# Patient Record
Sex: Female | Born: 1970 | Race: White | Hispanic: No | Smoking: Former smoker
Health system: Southern US, Community
[De-identification: ages and names within clinical notes are randomized; demographics above are authoritative.]

## PROBLEM LIST (undated history)

## (undated) DIAGNOSIS — G4733 Obstructive sleep apnea (adult) (pediatric): Secondary | ICD-10-CM

## (undated) DIAGNOSIS — N281 Cyst of kidney, acquired: Secondary | ICD-10-CM

## (undated) DIAGNOSIS — K219 Gastro-esophageal reflux disease without esophagitis: Secondary | ICD-10-CM

## (undated) DIAGNOSIS — B019 Varicella without complication: Secondary | ICD-10-CM

## (undated) DIAGNOSIS — G4730 Sleep apnea, unspecified: Secondary | ICD-10-CM

## (undated) DIAGNOSIS — D649 Anemia, unspecified: Secondary | ICD-10-CM

## (undated) DIAGNOSIS — I351 Nonrheumatic aortic (valve) insufficiency: Secondary | ICD-10-CM

## (undated) DIAGNOSIS — R519 Headache, unspecified: Secondary | ICD-10-CM

## (undated) DIAGNOSIS — L661 Lichen planopilaris, unspecified: Secondary | ICD-10-CM

## (undated) DIAGNOSIS — E785 Hyperlipidemia, unspecified: Secondary | ICD-10-CM

## (undated) DIAGNOSIS — F419 Anxiety disorder, unspecified: Secondary | ICD-10-CM

## (undated) DIAGNOSIS — K449 Diaphragmatic hernia without obstruction or gangrene: Secondary | ICD-10-CM

## (undated) DIAGNOSIS — R29898 Other symptoms and signs involving the musculoskeletal system: Secondary | ICD-10-CM

## (undated) DIAGNOSIS — T7840XA Allergy, unspecified, initial encounter: Secondary | ICD-10-CM

## (undated) DIAGNOSIS — IMO0001 Reserved for inherently not codable concepts without codable children: Secondary | ICD-10-CM

## (undated) DIAGNOSIS — IMO0002 Reserved for concepts with insufficient information to code with codable children: Secondary | ICD-10-CM

## (undated) DIAGNOSIS — R51 Headache: Secondary | ICD-10-CM

## (undated) DIAGNOSIS — I10 Essential (primary) hypertension: Secondary | ICD-10-CM

## (undated) DIAGNOSIS — U071 COVID-19: Secondary | ICD-10-CM

## (undated) HISTORY — DX: Anemia, unspecified: D64.9

## (undated) HISTORY — DX: Cyst of kidney, acquired: N28.1

## (undated) HISTORY — DX: Gastro-esophageal reflux disease without esophagitis: K21.9

## (undated) HISTORY — DX: Lichen planopilaris, unspecified: L66.10

## (undated) HISTORY — DX: Essential (primary) hypertension: I10

## (undated) HISTORY — DX: Sleep apnea, unspecified: G47.30

## (undated) HISTORY — PX: TONSILLECTOMY AND ADENOIDECTOMY: SUR1326

## (undated) HISTORY — PX: COLPOSCOPY: SHX161

## (undated) HISTORY — DX: Varicella without complication: B01.9

## (undated) HISTORY — DX: Anxiety disorder, unspecified: F41.9

## (undated) HISTORY — DX: Headache, unspecified: R51.9

## (undated) HISTORY — DX: Headache: R51

## (undated) HISTORY — DX: Diaphragmatic hernia without obstruction or gangrene: K44.9

## (undated) HISTORY — DX: Reserved for concepts with insufficient information to code with codable children: IMO0002

## (undated) HISTORY — DX: Other symptoms and signs involving the musculoskeletal system: R29.898

## (undated) HISTORY — DX: Nonrheumatic aortic (valve) insufficiency: I35.1

## (undated) HISTORY — DX: Hyperlipidemia, unspecified: E78.5

## (undated) HISTORY — DX: Lichen planopilaris: L66.1

## (undated) HISTORY — DX: Allergy, unspecified, initial encounter: T78.40XA

## (undated) HISTORY — DX: Obstructive sleep apnea (adult) (pediatric): G47.33

## (undated) HISTORY — DX: Reserved for inherently not codable concepts without codable children: IMO0001

---

## 1999-04-09 ENCOUNTER — Emergency Department (HOSPITAL_COMMUNITY): Admission: EM | Admit: 1999-04-09 | Discharge: 1999-04-09 | Payer: Self-pay | Admitting: Emergency Medicine

## 2000-03-11 ENCOUNTER — Encounter: Payer: Self-pay | Admitting: Internal Medicine

## 2000-03-11 ENCOUNTER — Encounter: Admission: RE | Admit: 2000-03-11 | Discharge: 2000-03-11 | Payer: Self-pay | Admitting: Internal Medicine

## 2001-12-11 ENCOUNTER — Encounter: Payer: Self-pay | Admitting: Geriatric Medicine

## 2001-12-11 ENCOUNTER — Encounter: Admission: RE | Admit: 2001-12-11 | Discharge: 2001-12-11 | Payer: Self-pay | Admitting: Geriatric Medicine

## 2002-02-05 ENCOUNTER — Encounter: Payer: Self-pay | Admitting: Emergency Medicine

## 2002-02-05 ENCOUNTER — Emergency Department (HOSPITAL_COMMUNITY): Admission: EM | Admit: 2002-02-05 | Discharge: 2002-02-05 | Payer: Self-pay | Admitting: Emergency Medicine

## 2002-08-20 ENCOUNTER — Emergency Department (HOSPITAL_COMMUNITY): Admission: EM | Admit: 2002-08-20 | Discharge: 2002-08-21 | Payer: Self-pay | Admitting: Emergency Medicine

## 2002-08-20 ENCOUNTER — Encounter: Payer: Self-pay | Admitting: Emergency Medicine

## 2003-01-12 ENCOUNTER — Other Ambulatory Visit: Admission: RE | Admit: 2003-01-12 | Discharge: 2003-01-12 | Payer: Self-pay | Admitting: Gynecology

## 2003-03-10 ENCOUNTER — Emergency Department (HOSPITAL_COMMUNITY): Admission: EM | Admit: 2003-03-10 | Discharge: 2003-03-11 | Payer: Self-pay | Admitting: Emergency Medicine

## 2003-04-22 ENCOUNTER — Ambulatory Visit (HOSPITAL_COMMUNITY): Admission: RE | Admit: 2003-04-22 | Discharge: 2003-04-22 | Payer: Self-pay | Admitting: Gynecology

## 2003-07-27 ENCOUNTER — Inpatient Hospital Stay (HOSPITAL_COMMUNITY): Admission: AD | Admit: 2003-07-27 | Discharge: 2003-07-29 | Payer: Self-pay | Admitting: Gynecology

## 2003-07-31 ENCOUNTER — Encounter: Admission: RE | Admit: 2003-07-31 | Discharge: 2003-08-30 | Payer: Self-pay | Admitting: Gynecology

## 2003-09-08 ENCOUNTER — Other Ambulatory Visit: Admission: RE | Admit: 2003-09-08 | Discharge: 2003-09-08 | Payer: Self-pay | Admitting: Gynecology

## 2003-09-30 ENCOUNTER — Encounter: Admission: RE | Admit: 2003-09-30 | Discharge: 2003-10-30 | Payer: Self-pay | Admitting: Gynecology

## 2003-10-31 ENCOUNTER — Encounter: Admission: RE | Admit: 2003-10-31 | Discharge: 2003-11-30 | Payer: Self-pay | Admitting: Gynecology

## 2003-12-29 ENCOUNTER — Encounter: Admission: RE | Admit: 2003-12-29 | Discharge: 2004-01-28 | Payer: Self-pay | Admitting: Gynecology

## 2004-02-28 ENCOUNTER — Encounter: Admission: RE | Admit: 2004-02-28 | Discharge: 2004-03-29 | Payer: Self-pay | Admitting: Gynecology

## 2004-03-08 ENCOUNTER — Other Ambulatory Visit: Admission: RE | Admit: 2004-03-08 | Discharge: 2004-03-08 | Payer: Self-pay | Admitting: Gynecology

## 2004-04-29 ENCOUNTER — Encounter: Admission: RE | Admit: 2004-04-29 | Discharge: 2004-05-29 | Payer: Self-pay | Admitting: Gynecology

## 2004-05-28 ENCOUNTER — Emergency Department (HOSPITAL_COMMUNITY): Admission: EM | Admit: 2004-05-28 | Discharge: 2004-05-28 | Payer: Self-pay | Admitting: Emergency Medicine

## 2004-08-29 ENCOUNTER — Other Ambulatory Visit: Admission: RE | Admit: 2004-08-29 | Discharge: 2004-08-29 | Payer: Self-pay | Admitting: Gynecology

## 2005-01-15 ENCOUNTER — Encounter: Admission: RE | Admit: 2005-01-15 | Discharge: 2005-01-15 | Payer: Self-pay | Admitting: Gynecology

## 2005-02-12 ENCOUNTER — Inpatient Hospital Stay (HOSPITAL_COMMUNITY): Admission: AD | Admit: 2005-02-12 | Discharge: 2005-02-12 | Payer: Self-pay | Admitting: Gynecology

## 2005-03-14 ENCOUNTER — Inpatient Hospital Stay (HOSPITAL_COMMUNITY): Admission: AD | Admit: 2005-03-14 | Discharge: 2005-03-14 | Payer: Self-pay | Admitting: Gynecology

## 2005-03-19 ENCOUNTER — Inpatient Hospital Stay (HOSPITAL_COMMUNITY): Admission: AD | Admit: 2005-03-19 | Discharge: 2005-03-21 | Payer: Self-pay | Admitting: Gynecology

## 2005-03-22 ENCOUNTER — Emergency Department (HOSPITAL_COMMUNITY): Admission: EM | Admit: 2005-03-22 | Discharge: 2005-03-22 | Payer: Self-pay | Admitting: Emergency Medicine

## 2005-04-04 ENCOUNTER — Emergency Department (HOSPITAL_COMMUNITY): Admission: EM | Admit: 2005-04-04 | Discharge: 2005-04-05 | Payer: Self-pay | Admitting: Emergency Medicine

## 2005-05-09 ENCOUNTER — Other Ambulatory Visit: Admission: RE | Admit: 2005-05-09 | Discharge: 2005-05-09 | Payer: Self-pay | Admitting: Gynecology

## 2005-06-19 ENCOUNTER — Ambulatory Visit: Payer: Self-pay | Admitting: Internal Medicine

## 2005-06-20 ENCOUNTER — Ambulatory Visit: Payer: Self-pay | Admitting: Internal Medicine

## 2005-10-23 ENCOUNTER — Emergency Department (HOSPITAL_COMMUNITY): Admission: EM | Admit: 2005-10-23 | Discharge: 2005-10-23 | Payer: Self-pay | Admitting: Family Medicine

## 2005-10-24 ENCOUNTER — Ambulatory Visit: Payer: Self-pay | Admitting: Internal Medicine

## 2006-01-06 ENCOUNTER — Ambulatory Visit: Payer: Self-pay | Admitting: Internal Medicine

## 2006-01-08 ENCOUNTER — Emergency Department (HOSPITAL_COMMUNITY): Admission: EM | Admit: 2006-01-08 | Discharge: 2006-01-08 | Payer: Self-pay | Admitting: Emergency Medicine

## 2006-01-08 ENCOUNTER — Ambulatory Visit: Payer: Self-pay | Admitting: Cardiology

## 2006-01-14 ENCOUNTER — Ambulatory Visit: Payer: Self-pay

## 2006-01-15 ENCOUNTER — Ambulatory Visit: Payer: Self-pay | Admitting: Internal Medicine

## 2006-03-27 ENCOUNTER — Observation Stay (HOSPITAL_COMMUNITY): Admission: EM | Admit: 2006-03-27 | Discharge: 2006-03-28 | Payer: Self-pay | Admitting: Emergency Medicine

## 2006-03-27 ENCOUNTER — Encounter (INDEPENDENT_AMBULATORY_CARE_PROVIDER_SITE_OTHER): Payer: Self-pay | Admitting: *Deleted

## 2006-03-28 ENCOUNTER — Ambulatory Visit: Payer: Self-pay | Admitting: Internal Medicine

## 2006-04-16 ENCOUNTER — Other Ambulatory Visit: Admission: RE | Admit: 2006-04-16 | Discharge: 2006-04-16 | Payer: Self-pay | Admitting: Gynecology

## 2006-09-30 ENCOUNTER — Emergency Department (HOSPITAL_COMMUNITY): Admission: EM | Admit: 2006-09-30 | Discharge: 2006-09-30 | Payer: Self-pay | Admitting: Family Medicine

## 2006-10-02 ENCOUNTER — Ambulatory Visit: Payer: Self-pay | Admitting: Internal Medicine

## 2006-10-06 ENCOUNTER — Emergency Department (HOSPITAL_COMMUNITY): Admission: EM | Admit: 2006-10-06 | Discharge: 2006-10-06 | Payer: Self-pay | Admitting: Emergency Medicine

## 2006-10-09 ENCOUNTER — Emergency Department (HOSPITAL_COMMUNITY): Admission: EM | Admit: 2006-10-09 | Discharge: 2006-10-09 | Payer: Self-pay | Admitting: Emergency Medicine

## 2006-10-11 ENCOUNTER — Emergency Department (HOSPITAL_COMMUNITY): Admission: EM | Admit: 2006-10-11 | Discharge: 2006-10-11 | Payer: Self-pay | Admitting: Emergency Medicine

## 2006-10-16 ENCOUNTER — Encounter: Admission: RE | Admit: 2006-10-16 | Discharge: 2006-10-16 | Payer: Self-pay | Admitting: Internal Medicine

## 2007-01-14 ENCOUNTER — Other Ambulatory Visit: Admission: RE | Admit: 2007-01-14 | Discharge: 2007-01-14 | Payer: Self-pay | Admitting: Gynecology

## 2007-01-16 ENCOUNTER — Ambulatory Visit: Payer: Self-pay | Admitting: Cardiology

## 2007-01-23 ENCOUNTER — Encounter: Payer: Self-pay | Admitting: Cardiology

## 2007-01-23 ENCOUNTER — Ambulatory Visit: Payer: Self-pay

## 2007-04-23 DIAGNOSIS — F411 Generalized anxiety disorder: Secondary | ICD-10-CM | POA: Insufficient documentation

## 2007-04-23 DIAGNOSIS — M654 Radial styloid tenosynovitis [de Quervain]: Secondary | ICD-10-CM | POA: Insufficient documentation

## 2007-04-23 DIAGNOSIS — IMO0002 Reserved for concepts with insufficient information to code with codable children: Secondary | ICD-10-CM | POA: Insufficient documentation

## 2007-04-23 DIAGNOSIS — O99810 Abnormal glucose complicating pregnancy: Secondary | ICD-10-CM | POA: Insufficient documentation

## 2007-04-23 DIAGNOSIS — E785 Hyperlipidemia, unspecified: Secondary | ICD-10-CM | POA: Insufficient documentation

## 2007-04-23 DIAGNOSIS — K219 Gastro-esophageal reflux disease without esophagitis: Secondary | ICD-10-CM | POA: Insufficient documentation

## 2007-04-23 DIAGNOSIS — F329 Major depressive disorder, single episode, unspecified: Secondary | ICD-10-CM | POA: Insufficient documentation

## 2008-05-20 ENCOUNTER — Ambulatory Visit: Payer: Self-pay | Admitting: Gynecology

## 2008-08-22 ENCOUNTER — Ambulatory Visit: Payer: Self-pay | Admitting: Gynecology

## 2008-08-22 ENCOUNTER — Other Ambulatory Visit: Admission: RE | Admit: 2008-08-22 | Discharge: 2008-08-22 | Payer: Self-pay | Admitting: Gynecology

## 2008-12-03 ENCOUNTER — Emergency Department (HOSPITAL_COMMUNITY): Admission: EM | Admit: 2008-12-03 | Discharge: 2008-12-03 | Payer: Self-pay | Admitting: Emergency Medicine

## 2009-04-18 ENCOUNTER — Ambulatory Visit: Payer: Self-pay | Admitting: Gynecology

## 2009-10-05 ENCOUNTER — Encounter: Admission: RE | Admit: 2009-10-05 | Discharge: 2009-10-05 | Payer: Self-pay | Admitting: Obstetrics and Gynecology

## 2010-09-23 ENCOUNTER — Encounter: Payer: Self-pay | Admitting: Gynecology

## 2010-12-12 LAB — COMPREHENSIVE METABOLIC PANEL
ALT: 17 U/L (ref 0–35)
Albumin: 3.8 g/dL (ref 3.5–5.2)
Alkaline Phosphatase: 95 U/L (ref 39–117)
CO2: 25 mEq/L (ref 19–32)
Calcium: 9.8 mg/dL (ref 8.4–10.5)
Chloride: 105 mEq/L (ref 96–112)
Creatinine, Ser: 0.64 mg/dL (ref 0.4–1.2)
Sodium: 139 mEq/L (ref 135–145)
Total Bilirubin: 1.3 mg/dL — ABNORMAL HIGH (ref 0.3–1.2)
Total Protein: 7.4 g/dL (ref 6.0–8.3)

## 2010-12-12 LAB — DIFFERENTIAL
Basophils Absolute: 0 10*3/uL (ref 0.0–0.1)
Basophils Relative: 0 % (ref 0–1)
Lymphs Abs: 1.8 10*3/uL (ref 0.7–4.0)
Monocytes Absolute: 0.3 10*3/uL (ref 0.1–1.0)
Monocytes Relative: 5 % (ref 3–12)
Neutro Abs: 4.1 10*3/uL (ref 1.7–7.7)
Neutrophils Relative %: 66 % (ref 43–77)

## 2010-12-12 LAB — CBC
Platelets: 275 10*3/uL (ref 150–400)
RBC: 4.58 MIL/uL (ref 3.87–5.11)

## 2010-12-12 LAB — POCT CARDIAC MARKERS: Myoglobin, poc: 85.9 ng/mL (ref 12–200)

## 2010-12-24 ENCOUNTER — Ambulatory Visit: Payer: Self-pay | Admitting: Gynecology

## 2011-01-15 NOTE — Assessment & Plan Note (Signed)
[facility]                            CARDIOLOGY OFFICE NOTE   Renee, Kent                        MRN:          [id number]  DATE:01/16/2007                            DOB:          10/10/1970    I was asked by [name] to evaluate [name] with intermittent  hand/ foot numbness, palpitations, and chest discomfort.   She is a very  pleasant 40 year old married white female who comes in  today for the above complaint.   She had a stress Myoview Jan 14, 2006 ordered by Dr. Jonny Kent. This was for  chest pain into her shoulder and neck. She exercised for 10 minutes and  15 seconds and had significant ST segment changes. Her ejection fraction  was 57% with no significant ischemia. She had normal contractility and  thickening in all areas of the myocardium.   She also had a 2D echocardiogram which was essentially normal. They  could not rule out a patent foramen ovale and suggested perhaps an  agitated saline contrast or bubble study.   She really has had more problems with right hand tingling and numbness  particularly when she over uses it, like with a hair dryer or brush. She  has had some chronic tingling in her right foot. In regard to chest  discomfort, she has had 1 episode when she was mowing the yard when she  had a little bit of tightness that went up into her right shoulder. She  was seen in the emergency room in February with palpitations. Evaluation  at that time was completely negative. I have copies of that visit.   She has multiple questions and tends to jump around making it very  difficult to get a good history. I think the significant complaints she  has are listed in the first part of the HPI.   PAST MEDICAL HISTORY:  SHE IS INTOLERANT OF CODEINE, LEVAQUIN APPARENTLY  CAUSED PALPITATIONS, AND ERYTHROMYCIN.   She quit smoking in 1992. She used marijuana up until 1990. She does not  drink. She does not use caffeinated  beverages.   CURRENT MEDICATIONS:  Alprazolam 0.5 mg p.o. daily.   She had a tonsillectomy in 1989.   FAMILY HISTORY:  Negative for premature coronary disease other than her  father she says had a heart attack at age 51 something but was a smoker.  He died at 24 of a heart attack.   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   REVIEW OF SYSTEMS:  Other than the HPI is positive for some allergies  and hay fever, some chronic breathing problems, anemia, fatigue, reflux,  urinary problems, diabetes which was gestational, and anxiety.   PHYSICAL EXAMINATION:  Blood pressure today is 102/80, her pulse is 86  and regular, her weight is 149.  HEENT: Normocephalic, atraumatic. PERRLA: Extraocular movements intact.  Sclera clear. Facial symmetry is normal. Carotid upstrokes were equal  bilaterally without bruits. There is no JVD. Thyroid is not enlarged.  LUNGS: Clear.  HEART: Reveals a nondisplaced PMI.  She has a normal S1, S2. She has no  rub, murmur, gallop, or click.  ABDOMINAL EXAM: Soft, good bowel sounds.  EXTREMITIES: Reveal no edema, pulses are intact.  NEURO EXAM: Intact.  SKIN: Intact.   EKG: Shows normal sinus rhythm with some ST segment changes inferiorly  in II, III, and aVF, in V5 and 6, which is unchanged from before.   ASSESSMENT/PLAN:  Mrs. Ofarrell is a very nice young lady who gives a very  difficult history to try to sort out anything specific. She has had  palpitations evaluated several times in the emergency room with no  findings. She has also had a stress Myoview which was negative within  the past year. She has also had a 2D echocardiogram which suggested a  possible PFO and suggested a bubble study be repeated.   I have tried to explain to her today that I do not think she has  coronary disease. I have asked her to return for a 2D echo with a bubble  study to rule out a shunt. If this is negative I really have no   explanation for her symptoms. I will see her back on a p.r.n. basis.     Renee C. Daleen Squibb, MD, [facility]  Electronically Signed    TCW/MedQ  DD: 01/16/2007  DT: 01/16/2007  Job #: [id number]   cc:   Renee Client, MD

## 2011-01-18 NOTE — H&P (Signed)
[name], [name]                        ACCOUNT NO.:  [id number]   MEDICAL RECORD NO.:  [id number]                   PATIENT TYPE:  INP   LOCATION:  NA                                   FACILITY:  [facility]   PHYSICIAN:  Timothy P. Fontaine, M.D.           DATE OF BIRTH:  [birth year]   DATE OF ADMISSION:  07/26/2003  DATE OF DISCHARGE:                                HISTORY & PHYSICAL   CHIEF COMPLAINT:  1. Pregnancy at term.  2. Pregnancy induced hypertension.   HISTORY OF PRESENT ILLNESS:  A 40 year old G1, P0 female at 26 weeks'  gestation, admitted for induction due to history of PIH, being followed with  antepartum testing which have been reassuring.  Her blood pressures have  been running in the 130s/80s range to 140s/90s range.  Her history is  notable for a low grade SIL Pap smear at [facility] as well as a right complex  adnexal mass that has been followed ultrasonographically during the  pregnancy.  The area has some solid as well as some low level echo areas  with the area measuring 17 x 15 x 14-mm.  This has been followed and had  been noted to be present in an ultrasound in April 2002 at approximately the  same size.  The patient also has a history of kidney stones which have not  been an issue during her pregnancy.  For the remainder of her history, see  her Hollister.   PHYSICAL EXAMINATION:  HEENT:  Normal.  LUNGS:  Clear.  CARDIAC:  Regular rate without rubs, murmurs or gallops.  ABDOMEN:  Benign.  PELVIC:  External BUS, vagina normal.  Cervix is 80%, loose fingertip, -1  station.   ASSESSMENT:  A 40 year old gravida 1, para 0 female, term gestation, history  of pregnancy induced hypertension for serial induction to start with  Cervidil followed by Pitocin in the morning.  She is noted to be positive  for beta strep and will receive antibiotic prophylaxis during labor.                                               Timothy P. Audie Box, M.D.    TPF/MEDQ  D:   07/26/2003  T:  07/26/2003  Job:  [id number]

## 2011-01-18 NOTE — H&P (Signed)
[name], [name] NO.:  [id number]   MEDICAL RECORD NO.:  [id number]          PATIENT TYPE:  INP   LOCATION:  3037                         FACILITY:  [facility]   PHYSICIAN:  Corwin Levins, M.D. LHCDATE OF BIRTH:  1970-11-29   DATE OF ADMISSION:  03/26/2006  DATE OF DISCHARGE:                                HISTORY & PHYSICAL   CHIEF COMPLAINT:  Left-sided weakness and numbness.   HISTORY OF PRESENT ILLNESS:  [name] is a 40 year old woman with a  history of generalized anxiety disorder and anemia and family history of  stroke who was well until 8 a.m. the morning of admission when she awoke  with left-sided tingling in her hand.  Twelve hours later she felt sudden  onset of weakness and slurred speech and confusion.  She took two aspirin  and called 911.  Her slurred speech resolved and in the emergency room she  still feels weak with numbness of the hand.  She denies any syncope.  There  was no seizure activity.  She denies unusual ingestions or recent drug use.  She has had no constitutional symptoms recently.   ALLERGIES:  CODEINE  and ERYTHROMYCIN.   MEDICATIONS:  Zoloft, Prevacid, Zestril, and Xanax.   PAST MEDICAL HISTORY:  1.  Anxiety.  2.  Prior stress test negative for chest pain.   FAMILY HISTORY:  Stroke in grandmother and great-grandmother. Family history  also notable for coronary artery disease, diabetes, and  hypercholesterolemia.   REVIEW OF SYSTEMS:  Significant cold intolerance.  Last menstrual period  four weeks ago.  Occasional atypical chest pain besides what is stated in  the HPI.   SOCIAL HISTORY:  No alcohol, no drugs, no tobacco.   PHYSICAL EXAMINATION:  VITAL SIGNS:  Temperature 97, blood pressure 111/70,  pulse 66, respiratory rate 18.  GENERAL:  She is a pale, anxious woman.  NEUROLOGIC:  Cranial nerves II-XII intact.  Speech is fluent.  Deep tendon  reflexes are 2+ and bilaterally equal throughout.  Sensation is  equal  throughout.  Muscle strength is 5/5, bilateral decreased intentional effort  noticed on the left.  However, this resolved with increased exercise.  Her  gait was normal.  There was no Romberg.  NECK:  No bruits.  Jugular venous pulse normal.  Carotids 2+ bilaterally.  LUNGS:  Clear to auscultation bilaterally.  CARDIOVASCULAR:  Regular with no murmurs, rubs or gallops.  ABDOMEN:  Soft, nontender, with positive bowel sounds.  EXTREMITIES:  Show no clubbing, cyanosis, or edema.   LABORATORY DATA:  EKG shows normal sinus rhythm with nonspecific ST changes.  Normal axis and normal intervals.  White count is 11.5, hemoglobin 12.7,  platelets 290.  Chemistries are negative including beta hCG.  Urine drug  screen is negative.  Head CT scan is negative.   ASSESSMENT/PLAN:  This is a 40 year old woman with atypical neurologic  symptoms.  Given her history, however, will admit the patient to telemetry  and evaluate for possible transient ischemic attack.  Will also want an MRI  tomorrow for evaluation of further neurologic pathology.  Denyse Amass, MD      ______________________________  Corwin Levins, M.D. [facility]    DBH/MEDQ  D:  03/27/2006  T:  03/27/2006  Job:  [id number]

## 2011-01-18 NOTE — Discharge Summary (Signed)
NAMETYNIA, WIERS NO.:  [id number]   MEDICAL RECORD NO.:  [id number]          PATIENT TYPE:  OBV   LOCATION:  3037                         FACILITY:  [facility]   PHYSICIAN:  Rene Paci, M.D. LHCDATE OF BIRTH:  [birth year]   DATE OF ADMISSION:  03/26/2006  DATE OF DISCHARGE:  03/28/2006                                 DISCHARGE SUMMARY   DISCHARGE DIAGNOSES:  1.  Transient left sided weakness/numbness.  No acute neurologic cardiac      abnormality identified.  2.  Anxiety disorder, question somatization.   DISCHARGE MEDICATIONS:  As prior to admission without change and include;  1.  Zoloft 20 mg daily.  2.  Cardizem 30 mg daily.  3.  Flexeril 5 mg p.o. t.i.d. p.r.n.  4.  Baby aspirin 81 mg once daily.   Followup with primary care physician Dr. Oliver Barre to be arranged for next  week for evaluation and for the patient's multiple concerns.   HISTORY:  This is a 40 year old woman with a history of anxiety.  The  patient has had left sided weakness, problems with hand and coordination of  her arms, slurring of speech and difficulty drinking water.  She has a  strong family history of stroke.  She came to the emergency room where she  was referred for admission.  Urine drug screen was negative.  CMET and CBC  were normal.  Head CT was negative.  She was admitted for observation to  rule out TIA.  MRI was checked which showed absolutely no abnormality.  2D  echocardiogram was also checked which showed normal left ventricular  function.  No abnormal findings.  Total cholesterol was 202 and LDL 134  which the patient reports is better than when it was checked in May.  TSH  normal.  Cardiac enzymes were negative x2.  The patient was taken off  telemetry monitor after the first 12 hours and nothing was identified during  this time.  At this time the patient still has other complaints, weakness in  her toes since her LP during childbirth the previous year.   Questions  regarding headaches which resolved with Flexeril, periodic right shoulder  pain, and drooping of her right eye.  She has again no abdominal findings.  We suggested that because of her anxiety, she may be hypersensitive to some  of these changes she is feeling.  Recommend the patient continue on her  current medications.  Difficulty with urination at time.  She is to followup  with primary MD and her psychiatrist on these issues.  Continue management  for anxiety disorder.      Rene Paci, M.D. [facility]  Electronically Signed     VL/MEDQ  D:  03/28/2006  T:  03/28/2006  Job:  [id number]

## 2011-01-18 NOTE — Assessment & Plan Note (Signed)
[facility]                                 ON-CALL NOTE   EMELIE, NEWSOM                        MRN:          [id number]  DATE:10/06/2006                            DOB:          1971/02/03    Date of interaction was October 06, 2006 at 3:55 a.m.  Phone number was  [phone].  The caller was Azizah Lisle, the husband.   OBJECTIVE:  The patient was seen on Thursday, Dr. Jonny Ruiz prescribed  Levaquin for upper respiratory infection.  She called me Thurs nite  worried about taking Levaquin after reading the handout with the  medication, worried about difficulties with the heart as there are  multiple heart problems in her family.  She was advised not to take the  medicatiion if she was that worried and check with Dr Jonny Ruiz in the  morning.  The husband says the patient chose to take the medicine and  has taken it since.  The patient has continued to take her medication,  but the husband reports her being clammy, lethargic, tachycardiac with  diarrhea and just does not feel well.   ASSESSMENT:  Medication reaction vs anxiety over taking medication with  reported side effects   PLAN:  It is hard to tell whether the patient is having just an anxiety  reaction to systemic feeling poorly from taking the medicine or whether  she is actually having a hypotensive episode from the medication.  Seems  best she go into the emergency room to be evaluated now to make sure she  is stable.  Primary care Flordia Kassem is Dr. Jonny Ruiz, home office is [place].     Arta Silence, MD  Electronically Signed    RNS/MedQ  DD: 10/06/2006  DT: 10/06/2006  Job #: [id number]

## 2011-01-18 NOTE — Assessment & Plan Note (Signed)
[facility]                                 ON-CALL NOTE   [name], [name]                        MRN:          [id number]  DATE:10/02/2006                            DOB:          [birth year]    Date of interaction is October 02, 2006 at 5:19 p.m.  Phone number is  [phone].   OBJECTIVE:  The patient was given a prescription today for Levaquin.  She has been reading the warnings on the handout that she got with the  Levaquin and is worried to take it.  She has a significant family  history of coronary disease and the warnings on the package are  significant for coronary problems.   Infection unknown type:  Prescribed Levaquin today in the plan.  I tried  to reassure her somewhat but told her that she must speak with Dr. Jonny Ruiz  to change her prescription or her antibiotic which is what she wanted.  She can call Dr. Jonny Ruiz in the morning or to the emergency room tonight if  she truly needs the antibiotics in the next 12-14 hours.  Primary care  Duncan Alejandro is Dr. Jonny Ruiz. Home office is [place].     Arta Silence, MD  Electronically Signed    RNS/MedQ  DD: 10/02/2006  DT: 10/02/2006  Job #: [id number]

## 2011-01-18 NOTE — H&P (Signed)
[name], [name]               ACCOUNT NO.:  [id number]   MEDICAL RECORD NO.:  [id number]          PATIENT TYPE:  INP   LOCATION:  9169                          FACILITY:  [facility]   PHYSICIAN:  Timothy P. Fontaine, M.D.DATE OF BIRTH:  [birth year]   DATE OF ADMISSION:  03/19/2005  DATE OF DISCHARGE:                                HISTORY & PHYSICAL   CHIEF COMPLAINT:  1.  Pregnancy at term.  2.  Gestational diabetes, diet controlled.  3.  Positive beta strep, prior pregnancy.  4.  History of pregnancy-induced hypertension, mild elevations this      pregnancy.  5.  Favorable cervix.   HISTORY OF PRESENT ILLNESS:  A 40 year old G2 P1 female at 3-[redacted] weeks  gestation, history of gestational diabetes diet controlled, history of PIH  with mild elevations in the 130 over 80-90 range with this pregnancy,  positive beta strep prior pregnancy, with favorable cervix at 2.5 cm, 0  station, 60% effaced. The patient is admitted at this time for induction due  to her diabetes and rising blood pressure. For the remainder of her history,  see her Hollister.   PHYSICAL EXAMINATION:  HEENT:  Normal.  LUNGS:  Clear.  CARDIAC:  Regular rate. No rubs, murmurs, or gallops.  ABDOMEN:  Consistent with term vertex pregnancy. External monitors reveal  reactive fetal tracing. Contractions every 2 minutes.  PELVIC:  Cervix 2-3 cm, 0 station, 60% effaced.  Artificial rupture of  membranes, fluid is clear.   ASSESSMENT AND PLAN:  A 40 year old gravida 2 para 1 at 38-39 weeks, history  of gestational diabetes, pregnancy-induced hypertension with prior  pregnancy, mild elevations with this pregnancy, favorable cervix, positive  beta streptococcus history with prior pregnancy, for induction. The options  of continued expectant management versus induction were reviewed with the  patient and the patient strongly desires induction. The patient is begun on  Pitocin. She is in regular contraction pattern. Artificial  rupture of  membranes, fluid is clear. Will cover beta strep history with penicillin  5,000,000 unit IV piggyback now. The patient's questions are answered.       TPF/MEDQ  D:  03/19/2005  T:  03/19/2005  Job:  [id number]

## 2011-05-16 DIAGNOSIS — IMO0001 Reserved for inherently not codable concepts without codable children: Secondary | ICD-10-CM | POA: Insufficient documentation

## 2011-05-23 ENCOUNTER — Encounter: Payer: Self-pay | Admitting: Gynecology

## 2011-05-23 ENCOUNTER — Other Ambulatory Visit (HOSPITAL_COMMUNITY)
Admission: RE | Admit: 2011-05-23 | Discharge: 2011-05-23 | Disposition: A | Discharge status: Home / Self Care | Payer: Self-pay | Source: Ambulatory Visit | Attending: Gynecology | Admitting: Gynecology

## 2011-05-23 ENCOUNTER — Ambulatory Visit (INDEPENDENT_AMBULATORY_CARE_PROVIDER_SITE_OTHER): Payer: Self-pay | Admitting: Gynecology

## 2011-05-23 VITALS — BP 120/72 | Ht 62.75 in | Wt 171.0 lb

## 2011-05-23 DIAGNOSIS — Z01419 Encounter for gynecological examination (general) (routine) without abnormal findings: Secondary | ICD-10-CM | POA: Insufficient documentation

## 2011-05-23 DIAGNOSIS — N920 Excessive and frequent menstruation with regular cycle: Secondary | ICD-10-CM

## 2011-05-23 DIAGNOSIS — R1011 Right upper quadrant pain: Secondary | ICD-10-CM

## 2011-05-23 DIAGNOSIS — N946 Dysmenorrhea, unspecified: Secondary | ICD-10-CM

## 2011-05-23 DIAGNOSIS — R823 Hemoglobinuria: Secondary | ICD-10-CM

## 2011-05-23 DIAGNOSIS — Z131 Encounter for screening for diabetes mellitus: Secondary | ICD-10-CM

## 2011-05-23 DIAGNOSIS — IMO0002 Reserved for concepts with insufficient information to code with codable children: Secondary | ICD-10-CM | POA: Insufficient documentation

## 2011-05-23 DIAGNOSIS — Z13220 Encounter for screening for lipoid disorders: Secondary | ICD-10-CM

## 2011-05-23 NOTE — Progress Notes (Signed)
[name] Oct 22, 1970 [id number]        40 y.o.  for annual exam.  Notes right upper quadrant discomfort occurs sporadically particularly when she is bending over or straining her muscles last for 3-5 minutes. No nausea vomiting no food intolerance and does not seem related to meals. Continues to have heavy painful periods and dyspareunia. Had a area on her right ovary on ultrasound described as a solid mass measuring 25 mm mean August 2010 she was to followup and she never did. We also did consider laparoscopy due to her pelvic pain, dyspareunia, menorrhagia and again she never followed up with this. She's having regular menses but still continue heavy and painful with her dyspareunia.  Past medical history,surgical history, medications, allergies, family history and social history were all reviewed and documented in the EPIC chart. ROS:  Was performed and pertinent positives and negatives are included in the history.  Exam: chaperone present Filed Vitals:   05/23/11 1137  BP: 120/72   General appearance  Normal Skin grossly normal Head/Neck normal with no cervical or supraclavicular adenopathy thyroid normal Lungs  clear Cardiac RR, without RMG Abdominal  soft, nontender, without masses, organomegaly or hernia right upper quadrant exam shows no abnormalities in the gallbladder region as far as tenderness or enlargement. Breasts  examined lying and sitting without masses, retractions, discharge or axillary adenopathy. Pelvic  Ext/BUS/vagina  normal   Cervix  normal  Pap done  Uterus  anteverted, normal size, shape and contour, midline and mobile nontender   Adnexa  Without masses or tenderness    Anus and perineum  normal   Rectovaginal  normal sphincter tone without palpated masses or tenderness.    Assessment/Plan:  40 y.o. female for annual exam.    #1 Right upper quadrant pain. Patient has sporadic right upper quadrant discomfort which I think is musculoskeletal as it only occurs  when she is bending or stooping. Her exam is totally normal. Does not sound like cholecystitis as is not related to meals and again only last minutes. Options for management to include a diagnostic study such as ultrasound CT were reviewed at this point I think we'll follow as it does not seem overly bothersome to the patient.  #2 Dysmenorrhea, menorrhagia, dyspareunia. Will check baseline CBC, TSH and sonohysterogram. Allow [place] to also followup on the right ovarian solid area that was seen several years ago. Patient knows importance of followup and we'll schedule this appointment.  #3 Health maintenance. Self breast exams on a monthly basis discussed encouraged. Screening mammogram recommendations between 35 and 40 were reviewed. She has no strong family history and prefers to wait till age 42. Baseline CBC glucose urinalysis and lipid profile were ordered along with her TSH. Patient will follow up for her sonohysterogram and lab work we'll go from there.    Dara Lords MD, 12:37 PM 05/23/2011

## 2011-06-03 ENCOUNTER — Ambulatory Visit (INDEPENDENT_AMBULATORY_CARE_PROVIDER_SITE_OTHER): Payer: Self-pay

## 2011-06-03 ENCOUNTER — Other Ambulatory Visit: Payer: Self-pay | Admitting: Gynecology

## 2011-06-03 ENCOUNTER — Ambulatory Visit (INDEPENDENT_AMBULATORY_CARE_PROVIDER_SITE_OTHER): Payer: Self-pay | Admitting: Gynecology

## 2011-06-03 DIAGNOSIS — D3910 Neoplasm of uncertain behavior of unspecified ovary: Secondary | ICD-10-CM

## 2011-06-03 DIAGNOSIS — N946 Dysmenorrhea, unspecified: Secondary | ICD-10-CM

## 2011-06-03 DIAGNOSIS — N920 Excessive and frequent menstruation with regular cycle: Secondary | ICD-10-CM

## 2011-06-03 DIAGNOSIS — IMO0002 Reserved for concepts with insufficient information to code with codable children: Secondary | ICD-10-CM

## 2011-06-03 NOTE — Progress Notes (Signed)
Patient presents for sonohysterogram to 2 dysmenorrhea, menorrhagia, dyspareunia history of right ovarian mass.  Ultrasound today shows persistence of her right ovarian mass at 29 mm mean endometrial echo 12.5 mm no other abnormalities seen. Sonohysterogram performed sterile technique EZ-Cap introduction good distention no abnormalities endometrial sample taken.  Assessment and plan: Menorrhagia, dysmenorrhea, dyspareunia, solid-appearing right ovarian mass. In review of her previous ultrasounds this area has been present for years noting in 02 it was18x17x13 mm. In 2009 it was 25 mm mean. I don't think that there's significant change with possible slight increase over the years. Reviewed differential with her to include endometrioma, dermoid, other pathology.  I do not think that it's the etiology of her menorrhagia but I do think it is certainly is contributing to her deep dyspareunia as it is on the right side and was reproduced when the vaginal probe hit this area. Options for management include laparoscopy removal of this area up to and including removal of the right ovary,  looking at the pelvis to rule out endometriosis or other pathology. Laparoscopic hysterectomy with RSO was also reviewed with her. I think this would be the definitive treatment medically for her menorrhagia dysmenorrhea as well as her dyspareunia. She wants to think of her options and if she does plan expected management the ovarian cancer disclaimer was reviewed with her although I doubt given the longevity of this mass without significant growth, but she would have to accept that possibility. Patient will followup for her biopsy results and with her decision.

## 2011-06-17 ENCOUNTER — Telehealth: Payer: Self-pay | Admitting: *Deleted

## 2011-06-17 ENCOUNTER — Telehealth: Payer: Self-pay | Admitting: Gynecology

## 2011-06-17 NOTE — Telephone Encounter (Signed)
Called patient with results of her endometrial sample from her sonohysterogram. It showed fragments of benign endometrial polyp. The sonohysterogram grossly was normal with no evidence of polyp. I discussed with patient I think this is a microscopic change and probably is not responsible for her dysmenorrhea menorrhagia and dyspareunia. I think the definitive treatment would be hysterectomy with RSO since this is the side if she chronically has pain with. Patient wants to think over options and said that she will call me back with her decision. If she decides against surgery then my recommendation would be to repeat the ultrasound to relook at the ovary and endometrial echo in 3-6 months. Patient understands our recommendations and will call with her decision or if she has any further questions.

## 2011-06-17 NOTE — Telephone Encounter (Signed)
Message copied by Aura Camps on Mon Jun 17, 2011  8:46 AM ------      Message from: Dara Lords      Created: Fri Jun 14, 2011  2:36 PM       Get patient on phone for me.  Thanks

## 2011-06-17 NOTE — Telephone Encounter (Signed)
Got pt on phone for Md.

## 2011-07-17 ENCOUNTER — Other Ambulatory Visit: Payer: Self-pay | Admitting: Family Medicine

## 2011-07-17 DIAGNOSIS — R1011 Right upper quadrant pain: Secondary | ICD-10-CM

## 2011-07-18 ENCOUNTER — Other Ambulatory Visit (HOSPITAL_BASED_OUTPATIENT_CLINIC_OR_DEPARTMENT_OTHER): Payer: Self-pay

## 2011-12-19 ENCOUNTER — Other Ambulatory Visit: Payer: Self-pay | Admitting: Gynecology

## 2011-12-19 DIAGNOSIS — N838 Other noninflammatory disorders of ovary, fallopian tube and broad ligament: Secondary | ICD-10-CM

## 2011-12-24 ENCOUNTER — Encounter: Payer: Self-pay | Admitting: Gynecology

## 2011-12-24 ENCOUNTER — Ambulatory Visit (INDEPENDENT_AMBULATORY_CARE_PROVIDER_SITE_OTHER): Payer: Self-pay | Admitting: Gynecology

## 2011-12-24 ENCOUNTER — Ambulatory Visit (INDEPENDENT_AMBULATORY_CARE_PROVIDER_SITE_OTHER): Payer: Self-pay

## 2011-12-24 DIAGNOSIS — R102 Pelvic and perineal pain: Secondary | ICD-10-CM

## 2011-12-24 DIAGNOSIS — N920 Excessive and frequent menstruation with regular cycle: Secondary | ICD-10-CM

## 2011-12-24 DIAGNOSIS — N83209 Unspecified ovarian cyst, unspecified side: Secondary | ICD-10-CM

## 2011-12-24 DIAGNOSIS — N831 Corpus luteum cyst of ovary, unspecified side: Secondary | ICD-10-CM

## 2011-12-24 DIAGNOSIS — N839 Noninflammatory disorder of ovary, fallopian tube and broad ligament, unspecified: Secondary | ICD-10-CM

## 2011-12-24 DIAGNOSIS — N949 Unspecified condition associated with female genital organs and menstrual cycle: Secondary | ICD-10-CM

## 2011-12-24 DIAGNOSIS — R319 Hematuria, unspecified: Secondary | ICD-10-CM

## 2011-12-24 DIAGNOSIS — R350 Frequency of micturition: Secondary | ICD-10-CM

## 2011-12-24 DIAGNOSIS — N838 Other noninflammatory disorders of ovary, fallopian tube and broad ligament: Secondary | ICD-10-CM

## 2011-12-24 LAB — URINALYSIS W MICROSCOPIC + REFLEX CULTURE
Casts: NONE SEEN
Crystals: NONE SEEN
Glucose, UA: NEGATIVE mg/dL
Ketones, ur: NEGATIVE mg/dL
Nitrite: NEGATIVE
Specific Gravity, Urine: 1.02 (ref 1.005–1.030)
Urobilinogen, UA: 0.2 mg/dL (ref 0.0–1.0)
WBC, UA: NONE SEEN WBC/hpf (ref <3)

## 2011-12-24 NOTE — Progress Notes (Signed)
Patient presents for follow up and ultrasound. She has a history of persistent right solid-appearing ovarian mass whichhas been present for a number of years dating back to 2002. It appears to have slowly increased in size over time noting ultrasound October 2012 with a 29 mm mean an ultrasound today with a 30 mm mean. She also notes lower abdominal discomfort that comes and goes and this last month some spotting in between her periods. Her menses are heavy with frequent pad changes for several days. She had a sonohysterogram in October 2012 that showed no defects but on endometrial sampling showed fragments of an endometrial polyp which we felt was probably a small subclinical finding. She was recommended to have repeat ultrasound today. Her ultrasound shows an endometrial echo at 24 mm with again the solid calcium containing mass in the right ovary at 30 mm mean. She does have a generous reticular cystic mass measuring 56 x 53 x 42 mm on the left ovary. This appears to be a hemorrhagic cyst with clot.  Exam was Sherrilyn Rist chaperone present Abdomen soft nontender without masses guarding rebound organomegaly. Pelvic external BUS vagina normal. Cervix normal. Uterus normal size midline mobile nontender. Adnexa without gross masses or significant tenderness.  Assessment and plan: Continued lower abdominal discomfort on and off. Spotting between her menses. Menorrhagia with persistent solid calcified cyst right ovary now with hemorrhagic-appearing cyst on left ovary. Again reviewed options for management. I think given the total picture to include dyspareunia also that hysterectomy would be her best choice. Alternatives for conservative laparoscopy with ovarian cystectomy and assessment of the pelvis was also reviewed. Hormonal manipulation also discussed although she is 41 and has a history of smoking in the past the risks of thrombosis to include stroke heart attack DVT was discussed. After lengthy discussion the  patient does want to proceed with hysterectomy which I think will address her issues. She does understand though that abdominal pain could be related to GI or urinary and no guarantees as far as pain relief but certainly from a menorrhagia, spotting between her periods and resolution of the ovarian cyst issues that hysterectomy with ovarian cystectomy possible oophorectomy would resolve these. Patient once to proceed with surgery and will move towards scheduling this and then she'll return for a preoperative consult.

## 2011-12-26 ENCOUNTER — Telehealth: Payer: Self-pay | Admitting: Gynecology

## 2011-12-26 LAB — URINE CULTURE
Colony Count: NO GROWTH
Organism ID, Bacteria: NO GROWTH

## 2011-12-26 NOTE — Telephone Encounter (Signed)
Had a long discussion with patient regarding the costs of TLH both [facility] and [facility].  I gave patient Rayna South's name/ph # as she is the Presenter, broadcasting at [facility] and may be able to help patient with financial assistance for [facility] charges.  Patient needs to discuss the costs with her husband to see what she can do.   Patient said you mentioned that if she cannot get this surgery soon that her ovary would require follow-up.  She wondered how soon she would need to have that done and would it be another u/s?   (Patient knows she will get this answer next week as Dr. Velvet Bathe out of office today and tomorrow.)  Patient will call Rayna and talk with her husband. She has my direct phone number for surgery scheduling and I will wait to hear from her.

## 2011-12-26 NOTE — Telephone Encounter (Signed)
Left message for patient to call me regarding scheduling Hysterectomy.

## 2011-12-29 NOTE — Telephone Encounter (Signed)
Follow up would be ultrasound in 2 months

## 2011-12-30 NOTE — Telephone Encounter (Signed)
I will let patient know when she calls me back.

## 2012-01-14 NOTE — Progress Notes (Signed)
Addended by: Venora Maples on: 01/14/2012 12:54 PM   Modules accepted: Orders

## 2012-01-15 ENCOUNTER — Other Ambulatory Visit: Payer: Self-pay

## 2012-01-16 LAB — URINALYSIS W MICROSCOPIC + REFLEX CULTURE
Bilirubin Urine: NEGATIVE
Glucose, UA: NEGATIVE mg/dL
Hgb urine dipstick: NEGATIVE
Leukocytes, UA: NEGATIVE
Nitrite: NEGATIVE
Squamous Epithelial / LPF: NONE SEEN

## 2012-01-22 ENCOUNTER — Telehealth: Payer: Self-pay | Admitting: *Deleted

## 2012-01-22 NOTE — Telephone Encounter (Signed)
Pt informed of normal urine culture results, left on voicemail.

## 2012-02-20 ENCOUNTER — Telehealth: Payer: Self-pay | Admitting: Gynecology

## 2012-02-20 NOTE — Telephone Encounter (Signed)
I called patient to remind her that it is time for follow-up ultrasound. Dr. Velvet Bathe had recommended if she did not have surgery that she return in 2 mos to recheck u/s. I offered to schedule it for her.  Patient was aware of this but at this time cannot schedule due to finance issues.  Her husband asked her to wait until next week to schedule when he knows more about their income.  She said she did talk with financial coordinator at [facility] and was told it would be about a $20,000 surgery but she would only have to have $2500 in advance.   She said she is not eligible for Medicaid as her husband's income is too much.  She will call us to schedule u/s.

## 2012-11-12 ENCOUNTER — Telehealth: Payer: Self-pay | Admitting: *Deleted

## 2012-11-12 DIAGNOSIS — N838 Other noninflammatory disorders of ovary, fallopian tube and broad ligament: Secondary | ICD-10-CM

## 2012-11-12 NOTE — Telephone Encounter (Signed)
Pt called to schedule follow up ultrasound per note on 02/20/12 order placed.

## 2012-11-18 ENCOUNTER — Ambulatory Visit: Payer: Self-pay | Admitting: Gynecology

## 2012-11-18 ENCOUNTER — Other Ambulatory Visit: Payer: Self-pay

## 2012-11-27 ENCOUNTER — Ambulatory Visit (INDEPENDENT_AMBULATORY_CARE_PROVIDER_SITE_OTHER): Payer: Managed Care, Other (non HMO) | Admitting: Gynecology

## 2012-11-27 ENCOUNTER — Ambulatory Visit (INDEPENDENT_AMBULATORY_CARE_PROVIDER_SITE_OTHER): Payer: Managed Care, Other (non HMO)

## 2012-11-27 ENCOUNTER — Encounter: Payer: Self-pay | Admitting: Gynecology

## 2012-11-27 DIAGNOSIS — N920 Excessive and frequent menstruation with regular cycle: Secondary | ICD-10-CM

## 2012-11-27 DIAGNOSIS — N83209 Unspecified ovarian cyst, unspecified side: Secondary | ICD-10-CM

## 2012-11-27 DIAGNOSIS — IMO0002 Reserved for concepts with insufficient information to code with codable children: Secondary | ICD-10-CM

## 2012-11-27 DIAGNOSIS — N839 Noninflammatory disorder of ovary, fallopian tube and broad ligament, unspecified: Secondary | ICD-10-CM

## 2012-11-27 DIAGNOSIS — N838 Other noninflammatory disorders of ovary, fallopian tube and broad ligament: Secondary | ICD-10-CM

## 2012-11-27 DIAGNOSIS — N946 Dysmenorrhea, unspecified: Secondary | ICD-10-CM

## 2012-11-27 NOTE — Patient Instructions (Signed)
Follow up for annual exam now

## 2012-11-27 NOTE — Progress Notes (Signed)
Patient follows up for ultrasound. Was seen in April 2013 with 5 cm hemorrhagic-appearing cyst on the left ovary. Persistent solid right ovarian mass at 30 mm. Had been 29 mm October 2012. Has menorrhagia to include double protection bleed through episodes. Sonohysterogram October 2012 negative. Endometrial sample showed proliferative endometrium with fragment of endometrial polyp. Felt to be small and questionable clinical significance.  Was to proceed with with surgery in April but never followed up for her and called and scheduled an ultrasound now.  Ultrasound shows uterus generous in size normal echotexture. Endometrial echo 27 mm. Left ovary normal with resolution of the prior hemorrhagic cyst. Right ovary with continued presence of solid area measuring 30 mm mean. Cul-de-sac negative.  Assessment and plan: Persistent solid area right ovary stable in size at 30 mm since October 2012. Generous endometrium although patient is ready to start her period now. Sonohysterogram showed empty cavity but biopsy suggestive of a polyp. Menorrhagia with bleed through episodes. Dyspareunia, deep with every coital episode. Sporadic pelvic pain almost on a daily basis. Discussed possible options to include hormonal manipulation, laparoscopy with removal of the right ovarian mass possible right oophorectomy with hysteroscopy D&C removal of any evident polyps. LAVH with right ovarian cystectomy versus oophorectomy reviewed. I think given her total picture LAVH with removal of right ovarian mass or right salpingo-oophorectomy the most appropriate course as this will address her bleeding issues and removal of this right area. Pain issue was discussed with her and they're no guarantees that this will relieve her pain but hopefully her dyspareunia will improve or resolve as will her pelvic discomfort. Patient wants to think about options. I did not examine her today as she is overdue for her annual exam and I asked her to  schedule this within the next 2 weeks and we can rediscuss and decide our plan.

## 2012-12-07 ENCOUNTER — Encounter: Payer: Self-pay | Admitting: Gynecology

## 2012-12-14 ENCOUNTER — Other Ambulatory Visit (HOSPITAL_COMMUNITY)
Admission: RE | Admit: 2012-12-14 | Discharge: 2012-12-14 | Disposition: A | Discharge status: Home / Self Care | Payer: Managed Care, Other (non HMO) | Source: Ambulatory Visit | Attending: Gynecology | Admitting: Gynecology

## 2012-12-14 ENCOUNTER — Ambulatory Visit (INDEPENDENT_AMBULATORY_CARE_PROVIDER_SITE_OTHER): Payer: Managed Care, Other (non HMO) | Admitting: Gynecology

## 2012-12-14 ENCOUNTER — Encounter: Payer: Self-pay | Admitting: Gynecology

## 2012-12-14 VITALS — BP 120/70 | Ht 62.0 in | Wt 172.0 lb

## 2012-12-14 DIAGNOSIS — N920 Excessive and frequent menstruation with regular cycle: Secondary | ICD-10-CM

## 2012-12-14 DIAGNOSIS — Z13220 Encounter for screening for lipoid disorders: Secondary | ICD-10-CM

## 2012-12-14 DIAGNOSIS — Z1151 Encounter for screening for human papillomavirus (HPV): Secondary | ICD-10-CM | POA: Insufficient documentation

## 2012-12-14 DIAGNOSIS — Z01419 Encounter for gynecological examination (general) (routine) without abnormal findings: Secondary | ICD-10-CM | POA: Insufficient documentation

## 2012-12-14 LAB — CBC WITH DIFFERENTIAL/PLATELET
Eosinophils Relative: 0 % (ref 0–5)
HCT: 35.8 % — ABNORMAL LOW (ref 36.0–46.0)
Hemoglobin: 12 g/dL (ref 12.0–15.0)
Lymphs Abs: 2.3 10*3/uL (ref 0.7–4.0)
MCHC: 33.5 g/dL (ref 30.0–36.0)
MCV: 83.3 fL (ref 78.0–100.0)
Monocytes Absolute: 0.3 10*3/uL (ref 0.1–1.0)
Neutro Abs: 2.9 10*3/uL (ref 1.7–7.7)
Platelets: 396 10*3/uL (ref 150–400)

## 2012-12-14 LAB — COMPREHENSIVE METABOLIC PANEL
AST: 18 U/L (ref 0–37)
Alkaline Phosphatase: 87 U/L (ref 39–117)
BUN: 10 mg/dL (ref 6–23)
Chloride: 101 mEq/L (ref 96–112)
Glucose, Bld: 84 mg/dL (ref 70–99)

## 2012-12-14 LAB — LIPID PANEL
Cholesterol: 222 mg/dL — ABNORMAL HIGH (ref 0–200)
HDL: 58 mg/dL (ref >39)
Total CHOL/HDL Ratio: 3.8 Ratio
Triglycerides: 211 mg/dL — ABNORMAL HIGH (ref <150)
VLDL: 42 mg/dL — ABNORMAL HIGH (ref 0–40)

## 2012-12-14 NOTE — Addendum Note (Signed)
Addended by: Dayna Barker on: 12/14/2012 01:56 PM   Modules accepted: Orders

## 2012-12-14 NOTE — Patient Instructions (Addendum)
Office will contact you to arrange ENT appointment   Office will contact you to arrange hysterectomy surgery.   Call to Schedule your mammogram  Facilities in [place]: 1)  [facility] [place], [place] Phone: [phone] 2)  [facility] [id number] Phone: [phone] 3)  Dr. Yolanda Bonine at [facility] [address] Phone: [phone]     Mammogram A mammogram is an X-ray test to find changes in a woman's breast. You should get a mammogram if:  You are 41 years of age or older  You have risk factors.   Your doctor recommends that you have one.  BEFORE THE TEST  Do not schedule the test the week before your period, especially if your breasts are sore during this time.  On the day of your mammogram:  Wash your breasts and armpits well. After washing, do not put on any deodorant or talcum powder on until after your test.   Eat and drink as you usually do.   Take your medicines as usual.   If you are diabetic and take insulin, make sure you:   Eat before coming for your test.   Take your insulin as usual.   If you cannot keep your appointment, call before the appointment to cancel. Schedule another appointment.  TEST  You will need to undress from the waist up. You will put on a hospital gown.   Your breast will be put on the mammogram machine, and it will press firmly on your breast with a piece of plastic called a compression paddle. This will make your breast flatter so that the machine can X-ray all parts of your breast.   Both breasts will be X-rayed. Each breast will be X-rayed from above and from the side. An X-ray might need to be taken again if the picture is not good enough.   The mammogram will last about 15 to 30 minutes.  AFTER THE TEST Finding out the results of your test Ask when your test results will be ready. Make sure you get your test  results.  Document Released: 11/15/2008 Document Revised: 08/08/2011 Document Reviewed: 11/15/2008 [facility] Patient Information 2012 [place], [place].

## 2012-12-14 NOTE — Progress Notes (Signed)
Chera [facility] 07-17-1971 [id number]        42 y.o.  [name] for annual exam.  Several issues noted below.  Past medical history,surgical history, medications, allergies, family history and social history were all reviewed and documented in the EPIC chart. ROS:  Was performed and pertinent positives and negatives are included in the history.  Exam: [name] assistant Filed Vitals:   12/14/12 1239  BP: 120/70  Height: 5\' 2"  (1.575 m)  Weight: 172 lb (78.019 kg)   General appearance  Normal Skin grossly normal Head/Neck normal with no cervical or supraclavicular adenopathy thyroid normal Lungs  clear Cardiac RR, without RMG Abdominal  soft, nontender, without masses, organomegaly or hernia Breasts  examined lying and sitting without masses, retractions, discharge or axillary adenopathy. Pelvic  Ext/BUS/vagina  normal   Cervix  normal Pap/HPV  Uterus  anteverted, normal size, shape and contour, midline and mobile nontender   Adnexa  Without masses or tenderness    Anus and perineum  normal   Rectovaginal  normal sphincter tone without palpated masses or tenderness.    Assessment/Plan:  42 y.o. [name] female for annual exam.   1. Menorrhagia/dyspareunia.  Patient presents with history of  menorrhagia to include double protection, bleed through episodes and consistent deep dyspareunia. Sonohysterogram October 2012 negative. Endometrial sample showed proliferative endometrium with fragment of endometrial polyp. Felt to be small and questionable clinical significance. Also had solid area in right ovary measuring 29 mm.  Was to proceed with surgery but never followed up and she had an ultrasound in March 2014 which showed uterus generous in size normal echotexture. Endometrial echo 27 mm. Left ovary normal with resolution of the prior hemorrhagic cyst. Right ovary with continued presence of solid area measuring 30 mm mean. Cul-de-sac negative.  Patient has decided that she wants to proceed with  hysterectomy and assessment of her right ovary possible excision of this solid area versus right salpingo-oophorectomy. We'll tentatively plan for LAVH possible RSO. We'll move toward scheduling and she will represent preoperatively. 2. Left neck swelling. Patient reports over the past year left neck swelling which seems worse at the end of the day.  Exam today to include neck, chest, breast exams all normal. We'll start with ENT exam and decide what further testing may be necessary to include CT of chest and neck.  3. History of low-grade dysplasia through 2006. Followup Pap smears have been normal. Last Pap smear 2012. Pap/HPV today. 4. Mammography. Patient's never had and I recommend baseline mammogram now she agrees to schedule. SBE monthly reviewed. 5. Health maintenance. Baseline CBC comprehensive metabolic panel lipid profile urinalysis ordered. Follow up for her ENT appointment and preoperatively for surgery.   Dara Lords MD, 1:07 PM 12/14/2012

## 2012-12-15 ENCOUNTER — Telehealth: Payer: Self-pay | Admitting: *Deleted

## 2012-12-15 ENCOUNTER — Other Ambulatory Visit: Payer: Self-pay | Admitting: Gynecology

## 2012-12-15 DIAGNOSIS — E7800 Pure hypercholesterolemia, unspecified: Secondary | ICD-10-CM

## 2012-12-15 LAB — URINALYSIS W MICROSCOPIC + REFLEX CULTURE
Bacteria, UA: NONE SEEN
Casts: NONE SEEN
Glucose, UA: NEGATIVE mg/dL
Leukocytes, UA: NEGATIVE
Protein, ur: NEGATIVE mg/dL
Squamous Epithelial / LPF: NONE SEEN
Urobilinogen, UA: 0.2 mg/dL (ref 0.0–1.0)
pH: 7 (ref 5.0–8.0)

## 2012-12-15 NOTE — Telephone Encounter (Signed)
Message copied by Aura Camps on Tue Dec 15, 2012 10:52 AM ------      Message from: Keenan Bachelor      Created: Tue Dec 15, 2012 10:46 AM      Regarding: ENT appt       Needs referral to ENT in "reference to complaints of left neck swelling" per Dr. Velvet Bathe. ------

## 2012-12-15 NOTE — Telephone Encounter (Signed)
appt on 12/21/12 @ 12:30 pm with Dr.Crossley notes faxed to office, pt informed.

## 2012-12-23 ENCOUNTER — Telehealth: Payer: Self-pay

## 2012-12-23 NOTE — Telephone Encounter (Signed)
I called patient to discuss scheduling surgery.  Patient saw ENT on Monday and had CT scan yesterday. She is awaiting those results.  We reviewed ins benefits and financial responsibilities to [facility] and [facility].  She will consider and wait for test results and will call me when ready to schedule.

## 2013-06-11 ENCOUNTER — Encounter: Payer: Self-pay | Admitting: Internal Medicine

## 2013-06-11 ENCOUNTER — Ambulatory Visit (HOSPITAL_COMMUNITY)
Admission: RE | Admit: 2013-06-11 | Discharge: 2013-06-11 | Disposition: A | Discharge status: Home / Self Care | Payer: Managed Care, Other (non HMO) | Source: Ambulatory Visit | Attending: Internal Medicine | Admitting: Internal Medicine

## 2013-06-11 ENCOUNTER — Ambulatory Visit (INDEPENDENT_AMBULATORY_CARE_PROVIDER_SITE_OTHER): Payer: Managed Care, Other (non HMO) | Admitting: Internal Medicine

## 2013-06-11 VITALS — BP 114/70 | HR 61 | Ht 63.0 in | Wt 170.9 lb

## 2013-06-11 DIAGNOSIS — M79609 Pain in unspecified limb: Secondary | ICD-10-CM

## 2013-06-11 DIAGNOSIS — R233 Spontaneous ecchymoses: Secondary | ICD-10-CM

## 2013-06-11 DIAGNOSIS — R600 Localized edema: Secondary | ICD-10-CM

## 2013-06-11 DIAGNOSIS — R609 Edema, unspecified: Secondary | ICD-10-CM

## 2013-06-11 DIAGNOSIS — M79605 Pain in left leg: Secondary | ICD-10-CM

## 2013-06-11 DIAGNOSIS — R002 Palpitations: Secondary | ICD-10-CM

## 2013-06-11 DIAGNOSIS — F411 Generalized anxiety disorder: Secondary | ICD-10-CM

## 2013-06-11 DIAGNOSIS — M7989 Other specified soft tissue disorders: Secondary | ICD-10-CM | POA: Insufficient documentation

## 2013-06-11 NOTE — Progress Notes (Signed)
OFFICE NOTE  Chief Complaint:  Left leg pain, knee pain, swelling, concern from blood clot  Primary Care Physician: Leo Grosser, MD  HPI:  [name] is a 42 year old female who presented today for evaluation of left leg pain. Apparently a few weeks ago she developed left leg pain, she could not hardly walk, she had trouble bending her leg and had swelling and tightness in her left calf. She also noted splotchiness in her left leg and was concerned about a blood clot. Apparently there is a history of a clotting disorder in her father. He also had coronary disease and a bypass surgery and died at age 39. She previously seeing Dr. Daleen Squibb with cardiology in 2007 for palpitations and underwent a stress test which was apparently normal. She decrease caffeine intake in her diet and her palpitations have improved. She does exhibit some significant anxiety and was told that she has had in the past. Her only medication is Xanax, but she did not take that.  She has no complaints of chest pain or shortness of breath today. Her only other complaint is that she has some fullness at the base of her left neck. Apparently she seen an ENT for this and had some extensive testing without a clear explanation as to why there is fullness and/or "swelling" at the left neck base. She also is complaining of some small blood vessels that enlarged and are tender at times. She pointed out some of these to me today, but they appeared essentially normal.  PMHx:  Past Medical History  Diagnosis Date  . Reflux   . 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 Anxiety     Past Surgical History  Procedure Laterality Date  . Tonsillectomy and adenoidectomy    . Colposcopy      FAMHx:  Family History  Problem Relation Age of Onset  . Hypertension Mother   . Cancer Mother     LUNG CANCER/SMOKER  . Diabetes Father   . Hypertension Father   . Heart disease Father   . Hypertension  Brother   . Cancer Maternal Uncle     ESOPHAGEAL CA  . Diabetes Cousin   . Heart disease Cousin   . Cancer Maternal Grandfather     Brain  . Stroke Paternal Grandmother     SOCHx:   reports that she quit smoking about 22 years ago. Her smoking use included Cigarettes. She smoked 0.00 packs per day. She has never used smokeless tobacco. She reports that she does not drink alcohol or use illicit drugs.  ALLERGIES:  Allergies  Allergen Reactions  . Codeine   . Erythromycin   . Levofloxacin     ROS: A comprehensive review of systems was negative except for: Constitutional: positive for generalized pain Cardiovascular: positive for lower extremity edema and palpitations Integument/breast: positive for skin color change and skin lesion(s) Musculoskeletal: positive for arthralgias and myalgias Behavioral/Psych: positive for anxiety  HOME MEDS: Current Outpatient Prescriptions  Medication Sig Dispense Refill  . ALPRAZolam (XANAX) 0.5 MG tablet Take 0.25 mg by mouth 2 (two) times daily as needed.        No current facility-administered medications for this visit.    LABS/IMAGING: No results found for this or any previous visit (from the past 48 hour(s)). No results found.  VITALS: BP 114/70  Pulse 61  Ht 5\' 3"  (1.6 m)  Wt 170 lb 14.4 oz (77.52 kg)  BMI 30.28 kg/m2  EXAM: General appearance: alert and significantly anxious, obese Neck: no adenopathy, no carotid bruit, no JVD, supple, symmetrical, trachea midline, thyroid not enlarged, symmetric, no tenderness/mass/nodules and mild TTP over the left neck base, no masses noted Lungs: clear to auscultation bilaterally Heart: regular rate and rhythm, S1, S2 normal, no murmur, click, rub or gallop Abdomen: soft, non-tender; bowel sounds normal; no masses,  no organomegaly Extremities: extremities normal, atraumatic, no cyanosis or edema and no significant varicose veins, there are petechiae over the legs, minimal edema, no  popliteal fullness Pulses: 2+ and symmetric Skin: Skin color, texture, turgor normal. No rashes or lesions or petechiae Neurologic: Mental status: Alert, oriented, thought content appropriate Psych: Extremely anxious, continued to insist that she has blood clots "all over"  EKG: Sinus rhythm at 61, no preexcitation  ASSESSMENT: 1. Left lower extremity swelling 2. Lower extremity petechiae  PLAN: 1.   [name] is complaining of left lower extremity swelling and pain, but the physical exam does not support a probable DVT. Despite my reassurance she feels that there is significant swelling and that she is high risk for DVT. There are petechiae over the lower strategies which could raise the possibility of a clotting disorder, and it is possible that she had a ruptured Baker's cyst perhaps or DVT that recanalized. I recommended a ultrasound of her leg today which was performed in the office while she waited. That study was negative for DVT of the left lower extremity. I recommended if she continues to have pain in her joints and legs that she see an orthopedist. I do feel however that anxiety is a significant underlying factor, and appropriate psychiatric evaluation, counseling and her medication may be helpful for her.  No specific follow-up is necessary.  Chrystie Nose, MD, [facility] Attending Cardiologist [facility]  Mardel Grudzien C 06/11/2013, 11:05 AM

## 2013-06-11 NOTE — Progress Notes (Signed)
Left Lower Extremity Venous Duplex Completed. °Brianna L Mazza,RVT °

## 2013-06-11 NOTE — Patient Instructions (Signed)
You will have a scan done of your left leg to rule out a clot (DVT). If this scan is negative you may have an orthopaedic issues.  You can follow up as needed with Dr. Rennis Golden.

## 2013-12-15 ENCOUNTER — Emergency Department (HOSPITAL_COMMUNITY): Payer: Managed Care, Other (non HMO)

## 2013-12-15 ENCOUNTER — Emergency Department (HOSPITAL_COMMUNITY)
Admission: EM | Admit: 2013-12-15 | Discharge: 2013-12-16 | Disposition: A | Discharge status: Home / Self Care | Payer: Managed Care, Other (non HMO) | Attending: Emergency Medicine | Admitting: Emergency Medicine

## 2013-12-15 ENCOUNTER — Encounter (HOSPITAL_COMMUNITY): Payer: Self-pay | Admitting: Emergency Medicine

## 2013-12-15 DIAGNOSIS — Z87891 Personal history of nicotine dependence: Secondary | ICD-10-CM | POA: Insufficient documentation

## 2013-12-15 DIAGNOSIS — Z8719 Personal history of other diseases of the digestive system: Secondary | ICD-10-CM | POA: Insufficient documentation

## 2013-12-15 DIAGNOSIS — J45909 Unspecified asthma, uncomplicated: Secondary | ICD-10-CM | POA: Insufficient documentation

## 2013-12-15 DIAGNOSIS — F411 Generalized anxiety disorder: Secondary | ICD-10-CM | POA: Insufficient documentation

## 2013-12-15 DIAGNOSIS — Z79899 Other long term (current) drug therapy: Secondary | ICD-10-CM | POA: Insufficient documentation

## 2013-12-15 DIAGNOSIS — L989 Disorder of the skin and subcutaneous tissue, unspecified: Secondary | ICD-10-CM

## 2013-12-15 MED ORDER — ALBUTEROL SULFATE HFA 108 (90 BASE) MCG/ACT IN AERS: 1.0000 | INHALATION_SPRAY | RESPIRATORY_TRACT | Status: DC | PRN

## 2013-12-15 NOTE — Discharge Instructions (Signed)
Asthma, Adult Asthma is a recurring condition in which the airways tighten and narrow. Asthma can make it difficult to breathe. It can cause coughing, wheezing, and shortness of breath. Asthma episodes (also called asthma attacks) range from minor to life-threatening. Asthma cannot be cured, but medicines and lifestyle changes can help control it. CAUSES Asthma is believed to be caused by inherited (genetic) and environmental factors, but its exact cause is unknown. Asthma may be triggered by allergens, lung infections, or irritants in the air. Asthma triggers are different for each person. Common triggers include:   Animal dander.  Dust mites.  Cockroaches.  Pollen from trees or grass.  Mold.  Smoke.  Air pollutants such as dust, household cleaners, hair sprays, aerosol sprays, paint fumes, strong chemicals, or strong odors.  Cold air, weather changes, and winds (which increase molds and pollens in the air).  Strong emotional expressions such as crying or laughing hard.  Stress.  Certain medicines (such as aspirin) or types of drugs (such as beta-blockers).  Sulfites in foods and drinks. Foods and drinks that may contain sulfites include dried fruit, potato chips, and sparkling grape juice.  Infections or inflammatory conditions such as the flu, a cold, or an inflammation of the nasal membranes (rhinitis).  Gastroesophageal reflux disease (GERD).  Exercise or strenuous activity. SYMPTOMS Symptoms may occur immediately after asthma is triggered or many hours later. Symptoms include:  Wheezing.  Excessive nighttime or early morning coughing.  Frequent or severe coughing with a common cold.  Chest tightness.  Shortness of breath. DIAGNOSIS  The diagnosis of asthma is made by a review of your medical history and a physical exam. Tests may also be performed. These may include:  Lung function studies. These tests show how much air you breath in and out.  Allergy  tests.  Imaging tests such as X-rays. TREATMENT  Asthma cannot be cured, but it can usually be controlled. Treatment involves identifying and avoiding your asthma triggers. It also involves medicines. There are 2 classes of medicine used for asthma treatment:   Controller medicines. These prevent asthma symptoms from occurring. They are usually taken every day.  Reliever or rescue medicines. These quickly relieve asthma symptoms. They are used as needed and provide short-term relief. Your health care provider will help you create an asthma action plan. An asthma action plan is a written plan for managing and treating your asthma attacks. It includes a list of your asthma triggers and how they may be avoided. It also includes information on when medicines should be taken and when their dosage should be changed. An action plan may also involve the use of a device called a peak flow meter. A peak flow meter measures how well the lungs are working. It helps you monitor your condition. HOME CARE INSTRUCTIONS   Take medicine as directed by your health care provider. Speak with your health care provider if you have questions about how or when to take the medicines.  Use a peak flow meter as directed by your health care provider. Record and keep track of readings.  Understand and use the action plan to help minimize or stop an asthma attack without needing to seek medical care.  Control your home environment in the following ways to help prevent asthma attacks:  Do not smoke. Avoid being exposed to secondhand smoke.  Change your heating and air conditioning filter regularly.  Limit your use of fireplaces and wood stoves.  Get rid of pests (such as roaches and   mice) and their droppings.  Throw away plants if you see mold on them.  Clean your floors and dust regularly. Use unscented cleaning products.  Try to have someone else vacuum for you regularly. Stay out of rooms while they are being  vacuumed and for a short while afterward. If you vacuum, use a dust mask from a hardware store, a double-layered or microfilter vacuum cleaner bag, or a vacuum cleaner with a HEPA filter.  Replace carpet with wood, tile, or vinyl flooring. Carpet can trap dander and dust.  Use allergy-proof pillows, mattress covers, and box spring covers.  Wash bed sheets and blankets every week in hot water and dry them in a dryer.  Use blankets that are made of polyester or cotton.  Clean bathrooms and kitchens with bleach. If possible, have someone repaint the walls in these rooms with mold-resistant paint. Keep out of the rooms that are being cleaned and painted.  Wash hands frequently. SEEK MEDICAL CARE IF:   You have wheezing, shortness of breath, or a cough even if taking medicine to prevent attacks.  The colored mucus you cough up (sputum) is thicker than usual.  Your sputum changes from clear or white to yellow, green, gray, or bloody.  You have any problems that may be related to the medicines you are taking (such as a rash, itching, swelling, or trouble breathing).  You are using a reliever medicine more than 2 3 times per week.  Your peak flow is still at 50 79% of you personal best after following your action plan for 1 hour. SEEK IMMEDIATE MEDICAL CARE IF:   You seem to be getting worse and are unresponsive to treatment during an asthma attack.  You are short of breath even at rest.  You get short of breath when doing very little physical activity.  You have difficulty eating, drinking, or talking due to asthma symptoms.  You develop chest pain.  You develop a fast heartbeat.  You have a bluish color to your lips or fingernails.  You are lightheaded, dizzy, or faint.  Your peak flow is less than 50% of your personal best.  You have a fever or persistent symptoms for more than 2 3 days.  You have a fever and symptoms suddenly get worse. MAKE SURE YOU:   Understand these  instructions.  Will watch your condition.  Will get help right away if you are not doing well or get worse. Document Released: 08/19/2005 Document Revised: 04/21/2013 Document Reviewed: 03/18/2013 Exit[employer] Patient Information 2014 [employer].  

## 2013-12-15 NOTE — ED Provider Notes (Signed)
CSN: [id number]     Arrival date & time 12/15/13  2015 History   First MD Initiated Contact with Patient 12/15/13 2058     This chart was scribed for non-physician practitioner, Margarita Mail, PA-C,  working with Blanchie Dessert, MD by Forrestine Him, ED Scribe. This patient was seen in room TR04C/TR04C and the patient's care was started at 10:43 PM.   Chief Complaint  Patient presents with  . Animal Bite   The history is provided by the patient. No language interpreter was used.    HPI Comments: [name] is a 43 y.o. female who presents to the Emergency Department complaining of a possible animal bite to the L ankle x 1 week. She states she noted a "sting" while she was outdoors during the day. However, she is unaware of what may have bit her. At this time she denies any fever, chills, rash, or weakness.   Pt also reports ongoing mild congestion and cough. She admits to a fever last week that has now resolved. No history of Asthma. She denies currently or previously being a smoker. Pt is requesting a chest X-Ray today. She has no pertinent medical history. No other concerns this visit.  PCP: Odette Fraction, MD   Past Medical History  Diagnosis Date  . Reflux   . 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 Anxiety    Past Surgical History  Procedure Laterality Date  . Tonsillectomy and adenoidectomy    . Colposcopy     Family History  Problem Relation Age of Onset  . Hypertension Mother   . Cancer Mother     LUNG CANCER/SMOKER  . Diabetes Father   . Hypertension Father   . Heart disease Father   . Hypertension Brother   . Cancer Maternal Uncle     ESOPHAGEAL CA  . Diabetes Cousin   . Heart disease Cousin   . Cancer Maternal Grandfather     Brain  . Stroke Paternal Grandmother    History  Substance Use Topics  . Smoking status: Former Smoker    Types: Cigarettes    Quit date: 09/02/1990  . Smokeless tobacco: Never Used  . Alcohol  Use: No   OB History   Grav Para Term Preterm Abortions TAB SAB Ect Mult Living   2 2 2       2      Review of Systems  Constitutional: Negative for fever and chills.  HENT: Positive for congestion.   Respiratory: Positive for cough.   Skin: Positive for wound. Negative for rash.  Psychiatric/Behavioral: Negative for confusion.      Allergies  Codeine; Erythromycin; and Levofloxacin  Home Medications   Prior to Admission medications   Medication Sig Start Date End Date Taking? Authorizing Provider  ALPRAZolam Duanne Moron) 0.5 MG tablet Take 0.25 mg by mouth 2 (two) times daily as needed.     Historical Provider, MD   Triage Vitals: BP 134/84  Pulse 86  Temp(Src) 97.9 F (36.6 C) (Oral)  Resp 18  SpO2 100%  LMP 12/12/2013   Physical Exam  Nursing note and vitals reviewed. Constitutional: She is oriented to person, place, and time. She appears well-developed and well-nourished.  HENT:  Head: Normocephalic and atraumatic.  Eyes: EOM are normal.  Neck: Normal range of motion.  Cardiovascular: Normal rate, regular rhythm, normal heart sounds and intact distal pulses.   Pulmonary/Chest: Effort normal and breath sounds normal. No respiratory distress. She has no wheezes.  She has no rales.  Musculoskeletal: Normal range of motion.  Neurological: She is alert and oriented to person, place, and time.  Skin: Skin is warm and dry.  4 faint erythematous papules to the posterior L ankle No surrounding induration No signs of infection  Psychiatric: She has a normal mood and affect. Her behavior is normal.    ED Course  Procedures (including critical care time)  DIAGNOSTIC STUDIES: Oxygen Saturation is 100% on RA, Normal by my interpretation.    COORDINATION OF CARE: 10:45 PM-Discussed treatment plan with pt at bedside and pt agreed to plan.     Labs Review Labs Reviewed - No data to display  Imaging Review Dg Chest 2 View  12/15/2013   CLINICAL DATA:  Shortness of breath   EXAM: CHEST  2 VIEW  COMPARISON:  None.  FINDINGS: The heart size and mediastinal contours are within normal limits. Both lungs are clear. The visualized skeletal structures are unremarkable.  IMPRESSION: No active cardiopulmonary disease.   Electronically Signed   By: Kathreen Devoid   On: 12/15/2013 23:15     EKG Interpretation None      MDM   Final diagnoses:  None      I personally performed the services described in this documentation, which was scribed in my presence. The recorded information has been reviewed and is accurate.    Blanchie Dessert, MD 12/18/13 2325

## 2013-12-15 NOTE — ED Notes (Signed)
Pt. reported bite mark at back of left ankle last Monday , concerned about her cough  and body aches last week , denies fever or chills.

## 2014-07-04 ENCOUNTER — Encounter (HOSPITAL_COMMUNITY): Payer: Self-pay | Admitting: Emergency Medicine

## 2015-09-26 ENCOUNTER — Encounter: Payer: Self-pay | Admitting: Gynecology

## 2015-09-26 ENCOUNTER — Telehealth: Payer: Self-pay | Admitting: *Deleted

## 2015-09-26 ENCOUNTER — Ambulatory Visit (INDEPENDENT_AMBULATORY_CARE_PROVIDER_SITE_OTHER): Payer: Managed Care, Other (non HMO) | Admitting: Gynecology

## 2015-09-26 VITALS — BP 120/74

## 2015-09-26 DIAGNOSIS — L309 Dermatitis, unspecified: Secondary | ICD-10-CM

## 2015-09-26 DIAGNOSIS — R21 Rash and other nonspecific skin eruption: Secondary | ICD-10-CM

## 2015-09-26 DIAGNOSIS — N83209 Unspecified ovarian cyst, unspecified side: Secondary | ICD-10-CM | POA: Diagnosis not present

## 2015-09-26 DIAGNOSIS — R221 Localized swelling, mass and lump, neck: Secondary | ICD-10-CM

## 2015-09-26 MED ORDER — BETAMETHASONE DIPROPIONATE AUG 0.05 % EX CREA: TOPICAL_CREAM | Freq: Two times a day (BID) | CUTANEOUS | Status: DC

## 2015-09-26 NOTE — Telephone Encounter (Signed)
-----   Message from [name], MD sent at 09/26/2015 10:19 AM EST ----- Schedule:  #1 diagnostic mammography of breast center. Reference never had mammogram and with inflammatory rash involving both nipples. #2 appointment with ENT reference left neck prominence. No palpable abnormalities. Suspect physiologic but want their input

## 2015-09-26 NOTE — Telephone Encounter (Signed)
Appointment 09/28/15 @ 1:20pm at breast center  Appointment 10/09/15 @ 1:00pm with Dr.Bates  Unable to leave message, no voicemail set up

## 2015-09-26 NOTE — Progress Notes (Signed)
[facility] [birth year] [id number]        44 y.o.  G2P2002 presents with several issues:  1. Rash on her breasts.  Started on left breast but now seems to be on the right breast. Very itchy. Present for several months 2. Swelling left side of her neck.  Noticed for "a long time". No pain or palpable masses but the left side looks more prominent. 3. History of ovarian cystic changes. Ultrasound 12/2012 with right ovarian solid thin-walled slightly vascular area 30 mm mean.  Present since 2012. Was to proceed with hysterectomy with removal of the cystic area but ultimately canceled.  Past medical history,surgical history, problem list, medications, allergies, family history and social history were all reviewed and documented in the EPIC chart.  Directed ROS with pertinent positives and negatives documented in the history of present illness/assessment and plan.  Exam: [name] assistant Filed Vitals:   09/26/15 0944  BP: 120/74   General appearance:  Normal HEENT with slight prominence along the left side of her neck from the clavicle to the lower jaw. No palpable adenopathy or masses. Both breast examined lying and sitting without masses retractions discharge adenopathy. She has a patch of eczema appearing skin 6 to 9:00 position on the left areola and 2 small similar appearing patches on the right areola. No underlying masses or firmness. Physical Exam  Pulmonary/Chest:       Assessment/Plan:  45 y.o. [id number] with:  1. Eczema involving both nipples. No significant inflammatory changes or palpable masses. Recommend Diprolene 0.05% cream twice a day. Assuming these areas totally clear will follow. If they persist will refer to dermatology. Recommend baseline mammogram now and will schedule this is a diagnostic for completeness. 2. Left neck prominence. No palpable abnormalities. I suspect this is physiologic but for completeness I will have ENT examine her and study as they see  appropriate. 3. History of cystic change right ovary persistent over time. Last study 2014. Check ultrasound now and patient will schedule. She also has a full GYN checkup scheduled in March as it has been several years.    [name] MD, 10:00 AM 09/26/2015

## 2015-09-26 NOTE — Patient Instructions (Addendum)
Apply the steroid cream to the rash on your breast. Follow up if this continues we will have dermatology see you. If it completely resolves then we will follow and use the cream as needed.  Follow up for ultrasound as scheduled  Office will call you to arrange the mammogram  Office will call you to arrange the ENT appointment to look at your neck.

## 2015-09-27 NOTE — Telephone Encounter (Signed)
[name] informed pt will the below

## 2015-09-28 ENCOUNTER — Other Ambulatory Visit: Payer: Managed Care, Other (non HMO)

## 2015-10-03 ENCOUNTER — Ambulatory Visit
Admission: RE | Admit: 2015-10-03 | Discharge: 2015-10-03 | Disposition: A | Discharge status: Home / Self Care | Payer: Managed Care, Other (non HMO) | Source: Ambulatory Visit | Attending: Gynecology | Admitting: Gynecology

## 2015-10-03 ENCOUNTER — Other Ambulatory Visit: Payer: Self-pay | Admitting: Gynecology

## 2015-10-03 DIAGNOSIS — L309 Dermatitis, unspecified: Secondary | ICD-10-CM

## 2015-10-04 ENCOUNTER — Other Ambulatory Visit: Payer: Managed Care, Other (non HMO)

## 2015-10-04 ENCOUNTER — Ambulatory Visit: Payer: Managed Care, Other (non HMO) | Admitting: Gynecology

## 2015-10-09 ENCOUNTER — Other Ambulatory Visit: Payer: Self-pay | Admitting: Gynecology

## 2015-10-09 ENCOUNTER — Telehealth: Payer: Self-pay

## 2015-10-09 ENCOUNTER — Ambulatory Visit (INDEPENDENT_AMBULATORY_CARE_PROVIDER_SITE_OTHER): Payer: Managed Care, Other (non HMO)

## 2015-10-09 ENCOUNTER — Encounter: Payer: Self-pay | Admitting: Gynecology

## 2015-10-09 ENCOUNTER — Ambulatory Visit (INDEPENDENT_AMBULATORY_CARE_PROVIDER_SITE_OTHER): Payer: Managed Care, Other (non HMO) | Admitting: Gynecology

## 2015-10-09 VITALS — BP 124/78

## 2015-10-09 DIAGNOSIS — R938 Abnormal findings on diagnostic imaging of other specified body structures: Secondary | ICD-10-CM

## 2015-10-09 DIAGNOSIS — N946 Dysmenorrhea, unspecified: Secondary | ICD-10-CM | POA: Diagnosis not present

## 2015-10-09 DIAGNOSIS — N838 Other noninflammatory disorders of ovary, fallopian tube and broad ligament: Secondary | ICD-10-CM

## 2015-10-09 DIAGNOSIS — N83209 Unspecified ovarian cyst, unspecified side: Secondary | ICD-10-CM

## 2015-10-09 DIAGNOSIS — N839 Noninflammatory disorder of ovary, fallopian tube and broad ligament, unspecified: Secondary | ICD-10-CM | POA: Diagnosis not present

## 2015-10-09 DIAGNOSIS — R9389 Abnormal findings on diagnostic imaging of other specified body structures: Secondary | ICD-10-CM

## 2015-10-09 DIAGNOSIS — N920 Excessive and frequent menstruation with regular cycle: Secondary | ICD-10-CM

## 2015-10-09 NOTE — Telephone Encounter (Signed)
Her last Pap smear was 2014 with HPV. That Pap smear actually is good for several years and we do not need to repeat her Pap smear before surgery

## 2015-10-09 NOTE — Patient Instructions (Signed)
Office will call you to arrange surgery. 

## 2015-10-09 NOTE — Progress Notes (Signed)
[facility] February 15, 1971 [id number]        45 y.o.  G2P2002 presents for ultrasound. History of menorrhagia, dysmenorrhea and dyspareunia. Was to proceed with hysterectomy previously but then decided against this.  Also has persistent solid-appearing right ovarian mass at 30 mm mean with last ultrasound 2014.  Past medical history,surgical history, problem list, medications, allergies, family history and social history were all reviewed and documented in the EPIC chart.  Directed ROS with pertinent positives and negatives documented in the history of present illness/assessment and plan.  Exam: Filed Vitals:   10/09/15 1006  BP: 124/78   General appearance:  Normal  Ultrasound shows uterus normal size and echotexture. Endometrial echo prominent with apparent defect between 5 x 16 mm. Right ovary with 37 x 32 x 41 mm solid-appearing mass with peripheral flow. Mean measurements 37 mm. Left ovary normal. Cul-de-sac negative  Assessment/Plan:  45 y.o. [id number] with persistent right ovarian solid area. Appears slightly larger than 3 years ago. Discussed with patient given the small amount of change unlikely malignant but certainly cannot guarantee this. Thickened endometrium with appearance of probable polyp. May explain her heavy or crampy periods. She has been complaining of this though for years and question as to whether this is an incidental finding unrelated. Options for management reviewed with her to include sonohysterogram for better definition of the endometrial defect recognizing if abnormal would proceed with hysteroscopy D&C regardless. Second option would be proceeding with hysteroscopy D&C now and due to the persistent right ovarian mass slightly enlarged over past measurement to consider laparoscopy with removal of the mass or removal of her right ovary leaving the left ovary for continued hormone production. Lastly we discussed definitive surgery to include LAVH with right ovarian mass  excision/right salpingo-oophorectomy also discussed. Childbearing is not an issue. She is no longer sexually active as she is separated but she did have consistent deep dyspareunia prohibiting intercourse when they were active.  She's having her persistent menorrhagia dysmenorrhea and I think given the total picture I would recommend LAVH with RSO and left salpingectomy is risk reductive surgery per SGO recommendation. If she does not want to proceed with this and I would certainly recommend a hysteroscopy D&C to clear the uterus, hopefully lighten her menses and for histologic sampling. Discussed the possibilities of atypia up to including early cancer in the thickened endometrial area. Would also recommend removing the right solid ovarian area/right oophorectomy so that this is no longer an issue that needs to be followed and for histologic diagnosis. At this point the patient wants to proceed with LAVH RSO and will go ahead and make arrangements for this. If she again she does not proceed with this as she had no past with prior arrangements then she clearly understands and accepts the risk of atypia/cancer.    [name] MD, 12:03 PM 10/09/2015

## 2015-10-09 NOTE — Telephone Encounter (Signed)
I called patient to discuss scheduling surgery but she is at work and cannot talk. She will call me in the morning.  She asked me to ask Dr. Loetta Rough about she is scheduled for her pap smear on 11/06/14.  She said she has not had a pap in a few years and wondered if you would want those results back before surgery?

## 2015-10-11 ENCOUNTER — Other Ambulatory Visit: Payer: Managed Care, Other (non HMO)

## 2015-10-11 ENCOUNTER — Ambulatory Visit: Payer: Managed Care, Other (non HMO) | Admitting: Gynecology

## 2015-10-25 NOTE — Telephone Encounter (Signed)
Patient informed. We discussed scheduling surgery again. She is in midst of moving related to separating from her husband and has too much going on right now to schedule surgery. We discussed that my first available date is end of April and to keep that in mind. She said she would not want to do it that soon. I am going to send her a financial letter with ins info and estimated surgery prepayment info for her review and she will call me when she is ready to schedule.

## 2015-11-06 ENCOUNTER — Encounter: Payer: Managed Care, Other (non HMO) | Admitting: Gynecology

## 2015-12-02 DIAGNOSIS — IMO0002 Reserved for concepts with insufficient information to code with codable children: Secondary | ICD-10-CM

## 2015-12-02 HISTORY — DX: Reserved for concepts with insufficient information to code with codable children: IMO0002

## 2015-12-06 ENCOUNTER — Encounter: Payer: Self-pay | Admitting: Gynecology

## 2015-12-06 ENCOUNTER — Ambulatory Visit (INDEPENDENT_AMBULATORY_CARE_PROVIDER_SITE_OTHER): Payer: Managed Care, Other (non HMO) | Admitting: Gynecology

## 2015-12-06 VITALS — BP 124/74 | Ht 63.0 in | Wt 167.0 lb

## 2015-12-06 DIAGNOSIS — Z01419 Encounter for gynecological examination (general) (routine) without abnormal findings: Secondary | ICD-10-CM

## 2015-12-06 DIAGNOSIS — Z13220 Encounter for screening for lipoid disorders: Secondary | ICD-10-CM | POA: Diagnosis not present

## 2015-12-06 DIAGNOSIS — N838 Other noninflammatory disorders of ovary, fallopian tube and broad ligament: Secondary | ICD-10-CM

## 2015-12-06 DIAGNOSIS — N926 Irregular menstruation, unspecified: Secondary | ICD-10-CM | POA: Diagnosis not present

## 2015-12-06 DIAGNOSIS — N839 Noninflammatory disorder of ovary, fallopian tube and broad ligament, unspecified: Secondary | ICD-10-CM | POA: Diagnosis not present

## 2015-12-06 LAB — COMPREHENSIVE METABOLIC PANEL
ALBUMIN: 3.8 g/dL (ref 3.6–5.1)
ALK PHOS: 89 U/L (ref 33–115)
ALT: 11 U/L (ref 6–29)
AST: 17 U/L (ref 10–30)
BILIRUBIN TOTAL: 0.6 mg/dL (ref 0.2–1.2)
BUN: 11 mg/dL (ref 7–25)
CO2: 27 mmol/L (ref 20–31)
CREATININE: 0.57 mg/dL (ref 0.50–1.10)
Calcium: 9 mg/dL (ref 8.6–10.2)
Chloride: 100 mmol/L (ref 98–110)
Glucose, Bld: 84 mg/dL (ref 65–99)
Potassium: 4.6 mmol/L (ref 3.5–5.3)
SODIUM: 135 mmol/L (ref 135–146)
TOTAL PROTEIN: 7.2 g/dL (ref 6.1–8.1)

## 2015-12-06 LAB — CBC WITH DIFFERENTIAL/PLATELET
BASOS PCT: 0 %
Basophils Absolute: 0 cells/uL (ref 0–200)
EOS PCT: 1 %
Eosinophils Absolute: 46 cells/uL (ref 15–500)
HCT: 33.3 % — ABNORMAL LOW (ref 35.0–45.0)
HEMOGLOBIN: 10.3 g/dL — AB (ref 11.7–15.5)
LYMPHS ABS: 1564 {cells}/uL (ref 850–3900)
Lymphocytes Relative: 34 %
MCH: 25 pg — ABNORMAL LOW (ref 27.0–33.0)
MCHC: 30.9 g/dL — AB (ref 32.0–36.0)
MCV: 80.8 fL (ref 80.0–100.0)
MONO ABS: 276 {cells}/uL (ref 200–950)
MPV: 8.7 fL (ref 7.5–12.5)
Monocytes Relative: 6 %
NEUTROS ABS: 2714 {cells}/uL (ref 1500–7800)
Neutrophils Relative %: 59 %
Platelets: 422 10*3/uL — ABNORMAL HIGH (ref 140–400)
RBC: 4.12 MIL/uL (ref 3.80–5.10)
RDW: 17.3 % — ABNORMAL HIGH (ref 11.0–15.0)
WBC: 4.6 10*3/uL (ref 3.8–10.8)

## 2015-12-06 LAB — HM PAP SMEAR

## 2015-12-06 LAB — LIPID PANEL
Cholesterol: 204 mg/dL — ABNORMAL HIGH (ref 125–200)
HDL: 50 mg/dL (ref >=46)
LDL CALC: 115 mg/dL (ref <130)
TRIGLYCERIDES: 194 mg/dL — AB (ref <150)
Total CHOL/HDL Ratio: 4.1 Ratio (ref <=5.0)
VLDL: 39 mg/dL — ABNORMAL HIGH (ref <30)

## 2015-12-06 NOTE — Addendum Note (Signed)
Addended by: Nelva Nay on: 12/06/2015 11:20 AM   Modules accepted: Orders, SmartSet

## 2015-12-06 NOTE — Progress Notes (Signed)
    [name] [facility] 14-Mar-1971 [id number]        45 y.o.  [id number]  for annual exam.  Several issues noted below.  Past medical history,surgical history, problem list, medications, allergies, family history and social history were all reviewed and documented as reviewed in the EPIC chart.  ROS:  Performed with pertinent positives and negatives included in the history, assessment and plan.   Additional significant findings :  none   Exam: [name] assistant Filed Vitals:   12/06/15 1042  BP: 124/74  Height: 5\' 3"  (1.6 m)  Weight: 167 lb (75.751 kg)   General appearance:  Normal affect, orientation and appearance. Skin: Grossly normal HEENT: Without gross lesions.  No cervical or supraclavicular adenopathy. Thyroid normal.  Lungs:  Clear without wheezing, rales or rhonchi Cardiac: RR, without RMG Abdominal:  Soft, nontender, without masses, guarding, rebound, organomegaly or hernia Breasts:  Examined lying and sitting without masses, retractions, discharge or axillary adenopathy. Pelvic:  Ext/BUS/vagina normal  Cervix normal. Pap smear done  Uterus anteverted, normal size, shape and contour, midline and mobile nontender   Adnexa without masses or tenderness    Anus and perineum normal   Rectovaginal normal sphincter tone without palpated masses or tenderness.    Assessment/Plan:  45 y.o. G67P2002 female for annual exam with regular menses, abstinent birth control.   1. Menorrhagia/breakthrough bleeding/right solid ovarian mass. Patient notes her periods continue heavy as they have been for years. Now notes some breakthrough bleeding. Recent ultrasound showed thicker endometrial echo with suggestion of endometrial polyp.  Also has continued presence of the right ovarian solid mass which was first noted back in 2012 and seems to be a little bit larger. We have discussed on several occasions management and she was to proceed with hysterectomy and never followed up with this. She was  just evaluated with ultrasound 10/2015 and I discussed various scenarios with her to include conservative approach with sonohysterogram/endometrial sampling. If endometrial polyp evident then proceeding with hysteroscopy D&C with resection of the polyp. Whether at that point also proceed with laparoscopic right ovarian mass removal/right salpingo-oophorectomy.  The issues that I cannot guarantee that either the endometrial polyp or the right ovarian mass would be cancer was discussed. I think unlikely given her clinical situation but she would have to accept the possibility if she did nothing. She did have an endometrial biopsy 2012 which showed fragments of a benign endometrial polyp and again she was to proceed with hysterectomy at that time but never followed up with it. Because she is having heavy menses and significant dysmenorrhea certainly think it's reasonable to also proceed with LAVH, right ovarian mass removal/right salpingo-oophorectomy for definitive treatment. Finances are an issue with her and she is trying to make arrangements for a payment plan. I reviewed with her though if she does nothing that she will accept the risks of precancer or cancer. The patient does want to move toward scheduling an LAVH right ovarian mass removal/RSO she will follow up with my scheduler to arrange for this. 2. Pap smear/HPV 2014 negative. Pap smear done today. 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 3. Mammography 09/2015. Continue with annual mammography when due. SBE monthly reviewed 4. Contraception not an issue and she does not anticipated it to be any time soon. 5. Health maintenance. Baseline CBC, CMP, lipid profile, urinalysis ordered.   Renee Auerbach MD, 11:04 AM 12/06/2015

## 2015-12-06 NOTE — Patient Instructions (Signed)
Office will call you to arrange for surgery. 

## 2015-12-07 ENCOUNTER — Other Ambulatory Visit: Payer: Self-pay | Admitting: Gynecology

## 2015-12-07 DIAGNOSIS — D500 Iron deficiency anemia secondary to blood loss (chronic): Secondary | ICD-10-CM

## 2015-12-07 LAB — URINALYSIS W MICROSCOPIC + REFLEX CULTURE
BACTERIA UA: NONE SEEN [HPF]
BILIRUBIN URINE: NEGATIVE
Casts: NONE SEEN [LPF]
Crystals: NONE SEEN [HPF]
GLUCOSE, UA: NEGATIVE
Hgb urine dipstick: NEGATIVE
Ketones, ur: NEGATIVE
LEUKOCYTES UA: NEGATIVE
Nitrite: NEGATIVE
PROTEIN: NEGATIVE
SPECIFIC GRAVITY, URINE: 1.017 (ref 1.001–1.035)
Squamous Epithelial / LPF: NONE SEEN [HPF] (ref <=5)
WBC UA: NONE SEEN WBC/HPF (ref <=5)
Yeast: NONE SEEN [HPF]
pH: 7 (ref 5.0–8.0)

## 2015-12-08 LAB — URINE CULTURE

## 2015-12-08 LAB — PAP IG W/ RFLX HPV ASCU

## 2015-12-11 LAB — HUMAN PAPILLOMAVIRUS, HIGH RISK: HPV DNA HIGH RISK: NOT DETECTED

## 2015-12-12 ENCOUNTER — Encounter: Payer: Self-pay | Admitting: Gynecology

## 2015-12-19 ENCOUNTER — Telehealth: Payer: Self-pay

## 2015-12-19 NOTE — Telephone Encounter (Signed)
I left message for patient that I now have June schedule available. I also asked her to call me to discuss recent lab results as well as dates for surgery.

## 2015-12-20 ENCOUNTER — Other Ambulatory Visit: Payer: Self-pay | Admitting: Gynecology

## 2015-12-20 DIAGNOSIS — E782 Mixed hyperlipidemia: Secondary | ICD-10-CM

## 2015-12-20 NOTE — Telephone Encounter (Signed)
Patient called. We discussed her recent pap smear result and need for repeat in one year. Recall placed. We discussed her lipid results and Dr. Loetta Rough rec since she was fasting then to work on increasing exercise and lowering fat in diet and recheck in 6 mos or so. Recall and order placed.  Patient was informed of available dates in June for surgery. She wants to talk with her husband and she will get back with me.

## 2016-06-07 ENCOUNTER — Telehealth: Payer: Self-pay | Admitting: *Deleted

## 2016-06-07 DIAGNOSIS — Z131 Encounter for screening for diabetes mellitus: Secondary | ICD-10-CM

## 2016-06-07 NOTE — Telephone Encounter (Signed)
Okay for fasting glucose and hemoglobin A1c

## 2016-06-07 NOTE — Telephone Encounter (Signed)
Pt is to come in for repeat lipid profile, asked if glucose and hemoglobin A1C can be drawn as well.

## 2016-06-10 NOTE — Telephone Encounter (Signed)
Pt aware labs placed, to schedule lab appointment, and to fast.

## 2016-10-21 ENCOUNTER — Ambulatory Visit: Payer: PRIVATE HEALTH INSURANCE | Attending: Orthopedic Surgery

## 2016-10-21 DIAGNOSIS — M545 Low back pain, unspecified: Secondary | ICD-10-CM

## 2016-10-21 DIAGNOSIS — M542 Cervicalgia: Secondary | ICD-10-CM | POA: Diagnosis not present

## 2016-10-21 DIAGNOSIS — R293 Abnormal posture: Secondary | ICD-10-CM

## 2016-10-21 DIAGNOSIS — M2560 Stiffness of unspecified joint, not elsewhere classified: Secondary | ICD-10-CM

## 2016-10-21 DIAGNOSIS — M62830 Muscle spasm of back: Secondary | ICD-10-CM | POA: Diagnosis present

## 2016-10-21 DIAGNOSIS — G8929 Other chronic pain: Secondary | ICD-10-CM | POA: Insufficient documentation
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
## 2016-10-22 MED FILL — MELOXICAM 15 MG TABLET: 15 | 30 days supply | Qty: 30 | Fill #0

## 2016-10-23 ENCOUNTER — Ambulatory Visit: Payer: PRIVATE HEALTH INSURANCE | Admitting: Physical Therapy

## 2016-10-29 ENCOUNTER — Ambulatory Visit: Payer: PRIVATE HEALTH INSURANCE | Attending: Orthopedic Surgery

## 2016-10-29 DIAGNOSIS — R293 Abnormal posture: Secondary | ICD-10-CM | POA: Diagnosis present

## 2016-10-29 DIAGNOSIS — G8929 Other chronic pain: Secondary | ICD-10-CM | POA: Diagnosis present

## 2016-10-29 DIAGNOSIS — M545 Low back pain, unspecified: Secondary | ICD-10-CM

## 2016-10-29 DIAGNOSIS — M542 Cervicalgia: Secondary | ICD-10-CM | POA: Diagnosis not present

## 2016-10-29 DIAGNOSIS — M62830 Muscle spasm of back: Secondary | ICD-10-CM | POA: Diagnosis present

## 2016-10-29 DIAGNOSIS — M2560 Stiffness of unspecified joint, not elsewhere classified: Secondary | ICD-10-CM | POA: Insufficient documentation

## 2016-10-29 NOTE — Therapy (Signed)
[place], [place], [address] Phone: [phone]   Fax:  [phone]  Physical Therapy Treatment  Patient Details  Name: [name] MRN: [id number] Date of Birth: 12-29-1970 Referring Provider: Frederik Pear, MD  Encounter Date: 10/29/2016      PT End of Session - 10/29/16 0721    Visit Number 2   Number of Visits 16   Date for PT Re-Evaluation 12/13/16   Authorization Type Workers compensation  [employer]   Authorization - Visit Number 1   Authorization - Number of Visits 12   PT Start Time 0717  17 min late   PT Stop Time 0808   PT Time Calculation (min) 51 min   Activity Tolerance Patient tolerated treatment well;No increased pain   Behavior During Therapy WFL for tasks assessed/performed      Past Medical History:  Diagnosis Date  . Anxiety   . ASCUS favoring benign 12/2015   negative high risk HPV  . 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 Reflux     Past Surgical History:  Procedure Laterality Date  . COLPOSCOPY    . TONSILLECTOMY AND ADENOIDECTOMY      There were no vitals filed for this visit.      Subjective Assessment - 10/29/16 0720    Subjective Not so bad today 4/10.   Currently in Pain? Yes   Pain Score 4    Pain Location Neck   Pain Orientation Right;Left;Posterior   Pain Descriptors / Indicators Aching   Pain Type Chronic pain   Pain Onset More than a month ago   Pain Frequency Constant   Aggravating Factors  prolonged positions   Pain Relieving Factors meds   Pain Score 4   Pain Location Back   Pain Orientation Right;Left;Posterior   Pain Descriptors / Indicators Aching;Dull   Pain Type Chronic pain   Pain Onset More than a month ago   Pain Frequency Constant   Aggravating Factors  Activity on feet, lifting   Pain Relieving Factors medication                         [facility] Adult PT Treatment/Exercise - 10/29/16 0001       Exercises   Exercises Neck;Lumbar     Neck Exercises: Machines for Strengthening   Other Machines for Strengthening Nustep L3 UE and LE  5 min     Neck Exercises: Seated   Neck Retraction 5 reps   Neck Retraction Limitations 10 seconds   Cervical Rotation Right;Left   Cervical Rotation Limitations 3 reps x 10 sec   Lateral Flexion Right;Left   Lateral Flexion Limitations 3 reps 15 sec RT/LT, followed by levator stretch RT /LT x 3 x 10-15 sec     Lumbar Exercises: Stretches   Single Knee to Chest Stretch 3 reps;20 seconds  RT /LT    Lower Trunk Rotation 3 reps;20 seconds  RT /LT   Pelvic Tilt Limitations 10 reps 3 sec     Lumbar Exercises: Supine   Bridge 10 reps   Bridge Limitations with PPT 3 sec      Modalities   Modalities Moist Heat     Moist Heat Therapy   Number Minutes Moist Heat 15 Minutes   Moist Heat Location Cervical;Lumbar Spine  whole back                PT Education - 10/29/16 [id number]  Education provided Yes   Education Details HEP neck and back stretches   Person(s) Educated Patient   Methods Explanation;Demonstration;Tactile cues;Verbal cues;Handout   Comprehension Returned demonstration;Verbalized understanding          PT Short Term Goals - 10/21/16 0755      PT SHORT TERM GOAL #1   Title She will be independent with inital HEP.    Time 2   Period Weeks   Status New     PT SHORT TERM GOAL #2   Title She will report decreased pian in neck and back by 40% or more. with work activity   Time 4   Period Weeks   Status New     PT SHORT TERM GOAL #3   Title She will report pain LT shoulder decreased 40% or more  with lifiting    Time 4   Period Weeks     PT SHORT TERM GOAL #4   Title she will be able to stand for more than 20 min before incr pain   Time 4   Period Weeks   Status New     PT SHORT TERM GOAL #5   Title she will demo understanding of good posture    Time 4   Period Weeks   Status New           PT  Long Term Goals - 10/21/16 0757      PT LONG TERM GOAL #1   Title She will be independent with all HEP issued as of last visit   Time 8   Period Weeks   Status New     PT LONG TERM GOAL #2   Title She will rpeort no back pain with home and work tasks   Time 8   Period Weeks   Status New     PT LONG TERM GOAL #3   Title She will report pain LT shoulder intermittant and 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 Heat eased pain at end.  She has  been late x2 so we do what we can in limited time. Add manual next visit and piriformis stretch   PT Treatment/Interventions Cryotherapy;Electrical Stimulation;Iontophoresis 4mg /ml Dexamethasone;Moist Heat;Ultrasound;Passive range of motion;Patient/family education;Taping;Manual techniques;Therapeutic exercise;Therapeutic activities;Dry needling   PT Next Visit Plan , modalities and manual for pain. Add to HEP as able. stab exercises, piriformis stretch   PT Home Exercise Plan posture awareness, neck and lumbar stretches rotation , side bend , cerv retraction , knee to chest, bridge   Consulted and Agree with Plan of Care Patient      Patient will benefit from skilled therapeutic intervention in order to improve the following deficits and impairments:  Decreased activity tolerance, Pain, Postural dysfunction, Decreased strength, Decreased  range of motion, Increased muscle spasms  Visit Diagnosis: Cervicalgia  Chronic bilateral low back pain  without sciatica  Abnormal posture  Muscle spasm of back  Joint stiffness of spine     Problem List Patient Active Problem List   Diagnosis Date Noted  . Leg edema, left 06/11/2013  . Petechiae 06/11/2013  . LGSIL (low grade squamous intraepithelial dysplasia)   . Reflux   . HYPERLIPIDEMIA 04/23/2007  . GAD (generalized anxiety disorder) 04/23/2007  . DEPRESSION 04/23/2007  . GERD 04/23/2007  . HYPERTENSION, GESTATIONAL 04/23/2007  . GESTATIONAL DIABETES 04/23/2007  . DE QUERVAIN'S TENOSYNOVITIS 04/23/2007    Darrel Hoover  PT 10/29/2016, 7:58 AM  [facility] [address] [place], [place], [address] Phone: [phone]   Fax:  [phone]  Name: Lataysha Zein MRN: [id number] Date of Birth: 11/03/1970

## 2016-10-29 NOTE — Patient Instructions (Signed)
Issued from cabinet cross postural syndrome exercises 3-10 reps 2-3x/day, 10-30 sec. Also LTR , RT /LT PPT short bridge,Knee to chest, all 3-10 reps  2-3x/day

## 2016-10-31 ENCOUNTER — Ambulatory Visit: Payer: PRIVATE HEALTH INSURANCE | Attending: Orthopedic Surgery | Admitting: Physical Therapy

## 2016-10-31 DIAGNOSIS — G8929 Other chronic pain: Secondary | ICD-10-CM | POA: Insufficient documentation

## 2016-10-31 DIAGNOSIS — M62830 Muscle spasm of back: Secondary | ICD-10-CM | POA: Diagnosis present

## 2016-10-31 DIAGNOSIS — M2560 Stiffness of unspecified joint, not elsewhere classified: Secondary | ICD-10-CM | POA: Diagnosis present

## 2016-10-31 DIAGNOSIS — M542 Cervicalgia: Secondary | ICD-10-CM | POA: Diagnosis present

## 2016-10-31 DIAGNOSIS — R293 Abnormal posture: Secondary | ICD-10-CM

## 2016-10-31 DIAGNOSIS — M545 Low back pain, unspecified: Secondary | ICD-10-CM

## 2016-10-31 NOTE — Patient Instructions (Signed)
   http://ss.exer.us/290   Copyright  VHI. All rights reserved.  Resistive Band Rowing   With resistive band anchored in door, grasp both ends. Keeping elbows bent, pull back, squeezing shoulder blades together. Hold __5__ seconds. Repeat __10-15__ times. Do __2__ sessions per day.

## 2016-10-31 NOTE — Therapy (Signed)
[place], [place], [address] Phone: [phone]   Fax:  [phone]  Physical Therapy Treatment  Patient Details  Name: [name] MRN: [id number] Date of Birth: 1971/01/17 Referring Provider: Frederik Pear, MD  Encounter Date: 10/31/2016      PT End of Session - 10/31/16 0743    Visit Number 3   Number of Visits 16   Date for PT Re-Evaluation 12/13/16   Authorization Type Workers compensation  [employer]   Authorization - Visit Number 2   Authorization - Number of Visits 12   PT Start Time 0735  20 minutes late    PT Stop Time 0815   PT Time Calculation (min) 40 min      Past Medical History:  Diagnosis Date  . Anxiety   . ASCUS favoring benign 12/2015   negative high risk HPV  . 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 Reflux     Past Surgical History:  Procedure Laterality Date  . COLPOSCOPY    . TONSILLECTOMY AND ADENOIDECTOMY      There were no vitals filed for this visit.      Subjective Assessment - 10/31/16 0738    Subjective I felt better after the heat but i hurt more the next morning.    Currently in Pain? Yes   Pain Score 3    Pain Location Neck  and upper back    Pain Orientation Right;Left;Posterior   Pain Descriptors / Indicators Aching   Pain Score 4   Pain Location Shoulder   Pain Orientation Left   Pain Descriptors / Indicators Sore;Burning;Dull                         [facility] Adult PT Treatment/Exercise - 10/31/16 0001      Neck Exercises: Theraband   Rows 10 reps   Rows Limitations red band    Other Theraband Exercises supine scap stab horiz abdct, ER, both increased left pain     Neck Exercises: Seated   Neck Retraction 5 reps   Neck Retraction Limitations 5 secs    Lateral Flexion Limitations 3 reps 15 sec RT/LT, followed by levator stretch RT /LT x 3 x 10-15 sec     Lumbar Exercises: Stretches   Single Knee to  Chest Stretch 3 reps;20 seconds  RT /LT    Lower Trunk Rotation 3 reps;20 seconds  RT /LT   Pelvic Tilt Limitations 10 reps 3 sec     Lumbar Exercises: Supine   Bridge 10 reps  c/o left shoulder pain    Bridge Limitations with PPT 3 sec      Modalities   Modalities Moist Heat     Moist Heat Therapy   Number Minutes Moist Heat 15 Minutes   Moist Heat Location Cervical  and upper back      Neck Exercises: Stretches   Upper Trapezius Stretch 2 reps;20 seconds                PT Education - 10/31/16 0754    Education provided Yes   Education Details HEP   Person(s) Educated Patient   Methods Explanation;Handout   Comprehension Verbalized understanding          PT Short Term Goals - 10/21/16 0755      PT SHORT TERM GOAL #1   Title She will be independent with inital HEP.    Time 2  Period Weeks   Status New     PT SHORT TERM GOAL #2   Title She will report decreased pian in neck and back by 40% or more. with work activity   Time 4   Period Weeks   Status New     PT SHORT TERM GOAL #3   Title She will report pain LT shoulder decreased 40% or more  with lifiting    Time 4   Period Weeks     PT SHORT TERM GOAL #4   Title she will be able to stand for more than 20 min before incr pain   Time 4   Period Weeks   Status New     PT SHORT TERM GOAL #5   Title she will demo understanding of good posture    Time 4   Period Weeks   Status New           PT Long Term Goals - 10/21/16 0757      PT LONG TERM GOAL #1   Title She will be independent with all HEP issued as of last visit   Time 8   Period Weeks   Status New     PT LONG TERM GOAL #2   Title She will rpeort no back pain with home and work tasks   Time 8   Period Weeks   Status New     PT LONG TERM GOAL #3   Title She will report pain LT shoulder intermittant and 1-2/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 Increased shoulder pain with bridge. Reviewed HEP from last visit. Trial of supine scap stab with increased shoulder pain. Standing rows did  not cause increased pain so added to HEP.    PT Next Visit Plan (Asked pt to arrive on time for appointments to try more manual/ modalities), modalities and manual for pain. Add to HEP as able. stab exercises, piriformis stretch   PT Home Exercise Plan posture awareness, neck and lumbar stretches rotation , side bend , cerv retraction , knee to chest, bridge   Consulted and Agree with Plan of Care Patient      Patient will benefit from skilled therapeutic intervention in order to improve the following deficits and impairments:  Decreased activity tolerance, Pain, Postural dysfunction, Decreased strength, Decreased range of motion, Increased muscle spasms  Visit Diagnosis: Cervicalgia  Chronic bilateral low back pain without sciatica  Abnormal posture  Muscle spasm of back  Joint stiffness of spine     Problem List Patient Active Problem List   Diagnosis Date Noted  . Leg edema, left 06/11/2013  . Petechiae 06/11/2013  . LGSIL (low grade squamous intraepithelial dysplasia)   . Reflux   . HYPERLIPIDEMIA 04/23/2007  . GAD (  generalized anxiety disorder) 04/23/2007  . DEPRESSION 04/23/2007  . GERD 04/23/2007  . HYPERTENSION, GESTATIONAL 04/23/2007  . GESTATIONAL DIABETES 04/23/2007  . DE QUERVAIN'S TENOSYNOVITIS 04/23/2007    Dorene Ar, PTA 10/31/2016, 8:15 AM  [facility] [address] [place], [place], [address] Phone: [phone]   Fax:  [phone]  Name: [name] MRN: [id number] Date of Birth: 1971/08/30

## 2016-11-04 ENCOUNTER — Ambulatory Visit: Payer: PRIVATE HEALTH INSURANCE

## 2016-11-07 ENCOUNTER — Ambulatory Visit: Payer: PRIVATE HEALTH INSURANCE | Attending: Orthopedic Surgery | Admitting: Physical Therapy

## 2016-11-07 DIAGNOSIS — M62830 Muscle spasm of back: Secondary | ICD-10-CM | POA: Insufficient documentation

## 2016-11-07 DIAGNOSIS — R293 Abnormal posture: Secondary | ICD-10-CM | POA: Insufficient documentation

## 2016-11-07 DIAGNOSIS — M542 Cervicalgia: Secondary | ICD-10-CM | POA: Insufficient documentation

## 2016-11-07 DIAGNOSIS — M545 Low back pain, unspecified: Secondary | ICD-10-CM

## 2016-11-07 DIAGNOSIS — M2560 Stiffness of unspecified joint, not elsewhere classified: Secondary | ICD-10-CM | POA: Diagnosis present

## 2016-11-07 DIAGNOSIS — G8929 Other chronic pain: Secondary | ICD-10-CM | POA: Diagnosis present

## 2016-11-07 NOTE — Therapy (Addendum)
[place], [place], [address] Phone: [phone]   Fax:  [phone]  Physical Therapy Treatment/Discharge  Patient Details  Name: [name] MRN: [id number] Date of Birth: October 31, 1970 Referring Provider: Frederik Pear, MD  Encounter Date: 11/07/2016      PT End of Session - 11/07/16 0726    Visit Number 4   Number of Visits 16   Date for PT Re-Evaluation 12/13/16   Authorization Type Workers compensation  [employer]   PT Start Time 0720   PT Stop Time 0815   PT Time Calculation (min) 55 min      Past Medical History:  Diagnosis Date  . Anxiety   . ASCUS favoring benign 12/2015   negative high risk HPV  . 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 Reflux     Past Surgical History:  Procedure Laterality Date  . COLPOSCOPY    . TONSILLECTOMY AND ADENOIDECTOMY      There were no vitals filed for this visit.      Subjective Assessment - 11/07/16 0722    Subjective had a terrible migraine monday with pain into jaw and neck.    Currently in Pain? Yes   Pain Score 4    Pain Location Neck  upper and middle back    Pain Orientation Right;Left;Posterior   Pain Descriptors / Indicators Aching   Pain Radiating Towards Lt hip    Aggravating Factors  worse at end of day   Pain Relieving Factors heat    Pain Score 6   Pain Location Shoulder   Pain Orientation Left   Pain Descriptors / Indicators Dull;Burning   Aggravating Factors  laying on it, lifting, reaching back   Pain Relieving Factors heat                          [facility] Adult PT Treatment/Exercise - 11/07/16 0001      Neck Exercises: Theraband   Rows 10 reps   Rows Limitations red band    Shoulder External Rotation 10 reps  yellow, left   Other Theraband Exercises left shoulder IR x 10 yellow left      Neck Exercises: Supine   Neck Retraction 10 reps     Lumbar Exercises: Quadruped   Madcat/Old Horse 10 reps   Madcat/Old Horse Limitations increased shoulder pain     Moist Heat Therapy   Number Minutes Moist Heat 15 Minutes   Moist Heat Location Cervical  posterior shoulder      Manual Therapy   Manual Therapy Taping;Soft tissue mobilization   Soft tissue mobilization cervical paraspinlas and trigger point release to bilateral upper traps    Kinesiotex --  3Ys to shoulder      Neck Exercises: Stretches   Upper Trapezius Stretch 2 reps;30 seconds  passive   Levator Stretch 2 reps;20 seconds  passive                  PT Short Term Goals - 10/21/16 0755      PT SHORT TERM GOAL #1   Title She will be independent with inital HEP.    Time 2   Period Weeks   Status New     PT SHORT TERM GOAL #2   Title She will report decreased pian in neck and back by 40% or more. with work activity   Time 4   Period Weeks   Status  New     PT SHORT TERM GOAL #3   Title She will report pain LT shoulder decreased 40% or more  with lifiting    Time 4   Period Weeks     PT SHORT TERM GOAL #4   Title she will be able to stand for more than 20 min before incr pain   Time 4   Period Weeks   Status New     PT SHORT TERM GOAL #5   Title she will demo understanding of good posture    Time 4   Period Weeks   Status New           PT Long Term Goals - 10/21/16 0757      PT LONG TERM GOAL #1   Title She will be independent with all HEP issued as of last visit   Time 8   Period Weeks   Status New     PT LONG TERM GOAL #2   Title She will rpeort no back pain with home and work tasks   Time 8   Period Weeks   Status New     PT LONG TERM GOAL #3   Title She will report pain LT shoulder intermittant and 1-2/10max 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 Trial of quadruped for upper back mobility and pt c/o left shoulder pain. Began soft tissue work to cervical paraspinals and upper traps, multiple trigger points in upper traps. Trial of KT tape to left shoulder. Will assess response next visit.    PT Next Visit Plan  assess KT tape, assess response to soft tissue work ; modalities and manual for pain cervical and upper back) try sub occipital release, TPDN?Marland Kitchen Add to HEP as able. stab exercises, piriformis stretch   PT Home Exercise Plan posture awareness, neck and lumbar stretches rotation , side bend , cerv retraction , knee to chest, bridge, row with red band    Consulted and Agree with Plan of Care Patient      Patient will benefit from skilled therapeutic intervention in order to improve the following deficits and impairments:  Decreased activity tolerance, Pain, Postural dysfunction, Decreased strength, Decreased range of motion, Increased muscle spasms  Visit Diagnosis: Cervicalgia  Chronic bilateral low back pain without sciatica  Abnormal posture  Muscle spasm of back  Joint stiffness of spine     Problem List Patient Active Problem List   Diagnosis Date Noted  . Leg edema, left 06/11/2013  . Petechiae 06/11/2013  . LGSIL (low grade squamous intraepithelial dysplasia)   . Reflux   . HYPERLIPIDEMIA 04/23/2007  . GAD (generalized anxiety disorder) 04/23/2007  . DEPRESSION 04/23/2007  . GERD 04/23/2007  . HYPERTENSION, GESTATIONAL 04/23/2007  . GESTATIONAL DIABETES 04/23/2007  .  DE QUERVAIN'S TENOSYNOVITIS 04/23/2007    Dorene Ar, PTA 11/07/2016, 9:28 AM  [facility] [address] [place], [place], [address] Phone: [phone]   Fax:  [phone]  Name: Ashaunti Treptow MRN: [id number] Date of Birth: 05/03/1971  PHYSICAL THERAPY DISCHARGE SUMMARY  Visits from Start of Care: 4  Current functional level related to goals / functional outcomes: Unknown as she stopped coming after this visit. She was scheduled for new eval  01/06/17 that she no showed   Remaining deficits: Unknown   Education / Equipment: HEP  Plan:                                                    Patient goals were not met. Patient is being discharged due to not returning since the last visit.  ?????   Noralee Stain  PT   01/09/17   12:22 PM

## 2016-11-12 ENCOUNTER — Ambulatory Visit: Payer: PRIVATE HEALTH INSURANCE

## 2016-11-14 ENCOUNTER — Telehealth: Payer: Self-pay | Admitting: Physical Therapy

## 2016-11-14 ENCOUNTER — Ambulatory Visit: Payer: PRIVATE HEALTH INSURANCE | Attending: Orthopedic Surgery | Admitting: Physical Therapy

## 2016-11-14 NOTE — Telephone Encounter (Signed)
Patient's primary phone number invalid. Spoke to her alternate contact, Richard Dorantes. Informed him of her missed appointment this morning and I asked him to have her call us if she cannot make her next appointment.

## 2016-11-18 ENCOUNTER — Ambulatory Visit: Payer: PRIVATE HEALTH INSURANCE

## 2016-11-20 ENCOUNTER — Ambulatory Visit: Payer: PRIVATE HEALTH INSURANCE | Admitting: Physical Therapy

## 2016-11-27 ENCOUNTER — Other Ambulatory Visit (HOSPITAL_COMMUNITY): Payer: Self-pay | Admitting: Orthopedic Surgery

## 2016-11-27 DIAGNOSIS — M25512 Pain in left shoulder: Secondary | ICD-10-CM

## 2016-11-27 DIAGNOSIS — M549 Dorsalgia, unspecified: Secondary | ICD-10-CM

## 2016-12-12 ENCOUNTER — Ambulatory Visit (HOSPITAL_COMMUNITY)
Admission: RE | Admit: 2016-12-12 | Discharge: 2016-12-12 | Disposition: A | Discharge status: Home / Self Care | Payer: PRIVATE HEALTH INSURANCE | Source: Ambulatory Visit | Attending: Orthopedic Surgery | Admitting: Orthopedic Surgery

## 2016-12-12 DIAGNOSIS — M50322 Other cervical disc degeneration at C5-C6 level: Secondary | ICD-10-CM | POA: Insufficient documentation

## 2016-12-12 DIAGNOSIS — M7552 Bursitis of left shoulder: Secondary | ICD-10-CM | POA: Diagnosis not present

## 2016-12-12 DIAGNOSIS — M549 Dorsalgia, unspecified: Secondary | ICD-10-CM

## 2016-12-12 DIAGNOSIS — M4802 Spinal stenosis, cervical region: Secondary | ICD-10-CM | POA: Insufficient documentation

## 2016-12-12 DIAGNOSIS — M25512 Pain in left shoulder: Secondary | ICD-10-CM | POA: Insufficient documentation

## 2016-12-12 DIAGNOSIS — M2578 Osteophyte, vertebrae: Secondary | ICD-10-CM | POA: Insufficient documentation

## 2016-12-12 DIAGNOSIS — M4722 Other spondylosis with radiculopathy, cervical region: Secondary | ICD-10-CM | POA: Diagnosis present

## 2016-12-12 DIAGNOSIS — M542 Cervicalgia: Secondary | ICD-10-CM | POA: Diagnosis present

## 2016-12-12 DIAGNOSIS — M5137 Other intervertebral disc degeneration, lumbosacral region: Secondary | ICD-10-CM | POA: Insufficient documentation

## 2016-12-12 DIAGNOSIS — M5126 Other intervertebral disc displacement, lumbar region: Secondary | ICD-10-CM | POA: Diagnosis not present

## 2016-12-12 DIAGNOSIS — M545 Low back pain: Secondary | ICD-10-CM | POA: Diagnosis present

## 2016-12-14 ENCOUNTER — Ambulatory Visit (HOSPITAL_COMMUNITY)
Admission: RE | Admit: 2016-12-14 | Discharge: 2016-12-14 | Disposition: A | Discharge status: Home / Self Care | Payer: PRIVATE HEALTH INSURANCE | Source: Ambulatory Visit | Attending: Orthopedic Surgery | Admitting: Orthopedic Surgery

## 2016-12-14 DIAGNOSIS — M25512 Pain in left shoulder: Secondary | ICD-10-CM

## 2016-12-14 DIAGNOSIS — M549 Dorsalgia, unspecified: Secondary | ICD-10-CM

## 2016-12-14 DIAGNOSIS — M50322 Other cervical disc degeneration at C5-C6 level: Secondary | ICD-10-CM | POA: Diagnosis not present

## 2016-12-18 ENCOUNTER — Ambulatory Visit (HOSPITAL_COMMUNITY)
Admission: EM | Admit: 2016-12-18 | Discharge: 2016-12-18 | Disposition: A | Discharge status: Home / Self Care | Payer: 59 | Attending: Emergency Medicine | Admitting: Emergency Medicine

## 2016-12-18 ENCOUNTER — Encounter (HOSPITAL_COMMUNITY): Payer: Self-pay | Admitting: Emergency Medicine

## 2016-12-18 DIAGNOSIS — J069 Acute upper respiratory infection, unspecified: Secondary | ICD-10-CM

## 2016-12-18 DIAGNOSIS — B9789 Other viral agents as the cause of diseases classified elsewhere: Secondary | ICD-10-CM | POA: Diagnosis not present

## 2016-12-18 DIAGNOSIS — R0981 Nasal congestion: Secondary | ICD-10-CM

## 2016-12-18 DIAGNOSIS — G501 Atypical facial pain: Secondary | ICD-10-CM

## 2016-12-18 DIAGNOSIS — R05 Cough: Secondary | ICD-10-CM | POA: Diagnosis not present

## 2016-12-18 MED ORDER — ALBUTEROL SULFATE HFA 108 (90 BASE) MCG/ACT IN AERS: 2.0000 | INHALATION_SPRAY | Freq: Four times a day (QID) | RESPIRATORY_TRACT | 0 refills | Status: DC | PRN

## 2016-12-18 MED ORDER — BENZONATATE 100 MG PO CAPS: 100.0000 mg | ORAL_CAPSULE | Freq: Three times a day (TID) | ORAL | 0 refills | Status: DC | PRN

## 2016-12-18 MED ORDER — IPRATROPIUM-ALBUTEROL 0.5-2.5 (3) MG/3ML IN SOLN
3.0000 mL | Freq: Once | RESPIRATORY_TRACT | Status: AC
  Administered 2016-12-18: 3 mL via RESPIRATORY_TRACT

## 2016-12-18 MED ORDER — IPRATROPIUM-ALBUTEROL 0.5-2.5 (3) MG/3ML IN SOLN
RESPIRATORY_TRACT | Status: AC
  Filled 2016-12-18: qty 3

## 2016-12-18 MED ORDER — PREDNISONE 20 MG PO TABS: 40.0000 mg | ORAL_TABLET | Freq: Every day | ORAL | 0 refills | Status: AC

## 2016-12-18 NOTE — Discharge Instructions (Signed)
Recommend use Albuterol inhaler 2 inhalations every 6 hours as needed for wheezing. Recommend Prednisone 40mg  daily for 4 days. Increase fluid intake to help loosen mucus in chest. Follow-up with your primary care provider in 3 to 4 days if not improving.

## 2016-12-18 NOTE — ED Triage Notes (Signed)
Pt. Stated, I've had some sinus pressure especially in the morning . I also have some wheezing in the morning.

## 2016-12-18 NOTE — ED Provider Notes (Signed)
CSN: [id number]     Arrival date & time 12/18/16  1812 History   First MD Initiated Contact with Patient 12/18/16 1855     Chief Complaint  Patient presents with  . Nasal Congestion  . Facial Pain   (Consider location/radiation/quality/duration/timing/severity/associated sxs/prior Treatment) 46 year old female presents with chills, nasal congestion and coughing that started 4 days ago. Getting worse with some wheezing and facial pressure. Denies any GI symptoms. No distinct history of asthma. Has taken Airborne and Ibuprofen with minimal relief. No other family members are ill. Has multiple questions regarding prevention of colds, symptoms, and treatments.    The history is provided by the patient.    Past Medical History:  Diagnosis Date  . Anxiety   . ASCUS favoring benign 12/2015   negative high risk HPV  . 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 Reflux    Past Surgical History:  Procedure Laterality Date  . COLPOSCOPY    . TONSILLECTOMY AND ADENOIDECTOMY     Family History  Problem Relation Age of Onset  . Hypertension Mother   . Cancer Mother     LUNG CANCER/SMOKER  . Diabetes Father   . Hypertension Father   . Heart disease Father   . Hypertension Brother   . Diabetes Brother   . Cancer Maternal Uncle     ESOPHAGEAL CA  . Diabetes Cousin   . Heart disease Cousin   . Cancer Maternal Grandfather     Brain  . Stroke Paternal Grandmother    Social History  Substance Use Topics  . Smoking status: Former Smoker    Types: Cigarettes    Quit date: 09/02/1990  . Smokeless tobacco: Never Used  . Alcohol use No   OB History    Gravida Para Term Preterm AB Living   2 2 2     2    SAB TAB Ectopic Multiple Live Births                 Review of Systems  Constitutional: Positive for chills and fatigue. Negative for appetite change.  HENT: Positive for congestion, rhinorrhea and sinus pressure. Negative for ear discharge, ear pain, facial  swelling, mouth sores, sinus pain, sneezing, sore throat and trouble swallowing.   Eyes: Negative for discharge, itching and visual disturbance.  Respiratory: Positive for cough and wheezing. Negative for chest tightness and shortness of breath.   Cardiovascular: Negative for chest pain.  Gastrointestinal: Negative for abdominal pain, diarrhea, nausea and vomiting.  Musculoskeletal: Negative for arthralgias, back pain, myalgias, neck pain and neck stiffness.  Skin: Negative for rash and wound.  Allergic/Immunologic: Negative for immunocompromised state.  Neurological: Positive for headaches. Negative for dizziness, syncope, weakness, light-headedness and numbness.  Hematological: Negative for adenopathy.    Allergies  Codeine; Erythromycin; and Levofloxacin  Home Medications   Prior to Admission medications   Medication Sig Start Date End Date Taking? Authorizing Provider  albuterol (PROVENTIL HFA;VENTOLIN HFA) 108 (90 Base) MCG/ACT inhaler Inhale 2 puffs into the lungs every 6 (six) hours as needed for wheezing or shortness of breath. 12/18/16   Katy Apo, NP  benzonatate (TESSALON) 100 MG capsule Take 1 capsule (100 mg total) by mouth 3 (three) times daily as needed for cough. 12/18/16   Katy Apo, NP  ibuprofen (ADVIL,MOTRIN) 200 MG tablet Take 200 mg by mouth every 6 (six) hours as needed.    Historical Provider, MD  predniSONE (DELTASONE) 20 MG tablet Take  2 tablets (40 mg total) by mouth daily. 12/18/16 12/22/16  Katy Apo, NP   Meds Ordered and Administered this Visit   Medications  ipratropium-albuterol (DUONEB) 0.5-2.5 (3) MG/3ML nebulizer solution 3 mL (3 mLs Nebulization Given 12/18/16 1921)    BP (!) 152/88 (BP Location: Right Arm)   Pulse (!) 105   Temp 98.9 F (37.2 C) (Oral)   Resp 17   Ht 5' 2.5" (1.588 m)   Wt 167 lb (75.8 kg)   LMP 12/08/2016   SpO2 100%   BMI 30.06 kg/m  No data found.   Physical Exam  Constitutional: She is oriented to  person, place, and time. She appears well-developed and well-nourished. She does not appear ill. No distress.  HENT:  Head: Normocephalic and atraumatic.  Right Ear: Hearing, tympanic membrane, external ear and ear canal normal.  Left Ear: Hearing, tympanic membrane, external ear and ear canal normal.  Nose: Mucosal edema and rhinorrhea present. Right sinus exhibits no maxillary sinus tenderness and no frontal sinus tenderness. Left sinus exhibits no maxillary sinus tenderness and no frontal sinus tenderness.  Mouth/Throat: Uvula is midline and mucous membranes are normal. Posterior oropharyngeal erythema present.  Eyes: Conjunctivae and EOM are normal. Pupils are equal, round, and reactive to light.  Neck: Normal range of motion. Neck supple.  Cardiovascular: Normal rate, regular rhythm and normal heart sounds.   No murmur heard. Pulmonary/Chest: Effort normal. No respiratory distress. She has decreased breath sounds in the right upper field, the right lower field, the left upper field and the left lower field. She has wheezes in the right upper field and the left upper field. She has no rhonchi. She has no rales.  Lymphadenopathy:    She has no cervical adenopathy.  Neurological: She is alert and oriented to person, place, and time.  Skin: Skin is warm and dry. Capillary refill takes less than 2 seconds.  Psychiatric: Her speech is normal and behavior is normal. Thought content normal. Her mood appears anxious. Cognition and memory are normal.    Urgent Care Course     Procedures (including critical care time)  Labs Review Labs Reviewed - No data to display  Imaging Review No results found.   Visual Acuity Review  Right Eye Distance:   Left Eye Distance:   Bilateral Distance:    Right Eye Near:   Left Eye Near:    Bilateral Near:         MDM   1. Viral URI with cough    Due to mild wheezes on exam, recommend DuoNeb now. Patient tolerated nebulizer treatment well and  felt better after treatment. Lungs had minimal wheezes in the upper lobes after treatment and no decreased breath sounds. Discussed with patient that she has a viral illness. Patient continued to ask about antibiotics but I continued to emphasize that history and clinical findings do not support a bacterial infection at this time. Spent over 30 minutes reviewing symptoms and appropriate treatment and answering her questions. Recommend use Albuterol inhaler 2 puffs every 6 hours as needed. Start Prednisone 40mg  daily for 4 days and then stop. May take Tessalon cough pills 1 every 8 hours as needed. Increase fluid intake to help loosen mucus in chest. Follow-up with her primary care provider in 3 to 4 days if not improving.      Katy Apo, NP 12/19/16 [phone]

## 2017-01-06 ENCOUNTER — Ambulatory Visit: Payer: PRIVATE HEALTH INSURANCE | Attending: Orthopedic Surgery

## 2017-07-07 ENCOUNTER — Ambulatory Visit (HOSPITAL_COMMUNITY)
Admission: EM | Admit: 2017-07-07 | Discharge: 2017-07-07 | Disposition: A | Discharge status: Home / Self Care | Payer: 59 | Attending: Emergency Medicine | Admitting: Emergency Medicine

## 2017-07-07 ENCOUNTER — Encounter (HOSPITAL_COMMUNITY): Payer: Self-pay | Admitting: Emergency Medicine

## 2017-07-07 DIAGNOSIS — K0889 Other specified disorders of teeth and supporting structures: Secondary | ICD-10-CM | POA: Diagnosis not present

## 2017-07-07 MED ORDER — BENZOCAINE 10 % MT GEL: 1.0000 "application " | OROMUCOSAL | 0 refills | Status: DC | PRN

## 2017-07-07 MED ORDER — PENICILLIN V POTASSIUM 500 MG PO TABS: 500.0000 mg | ORAL_TABLET | Freq: Four times a day (QID) | ORAL | 0 refills | Status: AC

## 2017-07-07 MED ORDER — NAPROXEN 500 MG PO TABS: 500.0000 mg | ORAL_TABLET | Freq: Two times a day (BID) | ORAL | 0 refills | Status: AC

## 2017-07-07 NOTE — ED Triage Notes (Signed)
Pt here for left sided dental pain 

## 2017-07-07 NOTE — Discharge Instructions (Signed)
Start Penicillin as directed for dental abscess. Naproxen and oragel for pain. Follow up with dentist for further treatment and evaluation. If experiencing swelling of the throat, trouble breathing, trouble swallowing, follow up for reevaluation.

## 2017-07-07 NOTE — ED Provider Notes (Signed)
[facility]    CSN: [id number] Arrival date & time: 07/07/17  1830     History   Chief Complaint Chief Complaint  Patient presents with  . Dental Pain    HPI [name] is a 46 y.o. female.   46 year old female comes in for dental pain. She has a known cracked tooth of left lower jaw that has been painful on and off. Current episode started 3-4 days ago and has been getting worse. Denies swelling of the face, swelling of the throat, trouble breathing, trouble swallowing. Denies fever, chills, night sweats. Took ibuprofen 800mg  with good improvement. Patient states she has already been evaluated by oral surgery and was hoping to wait till the next year to take care of the tooth due to finances.       Past Medical History:  Diagnosis Date  . Anxiety   . ASCUS favoring benign 12/2015   negative high risk HPV  . 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 Reflux     Patient Active Problem List   Diagnosis Date Noted  . Leg edema, left 06/11/2013  . Petechiae 06/11/2013  . LGSIL (low grade squamous intraepithelial dysplasia)   . Reflux   . HYPERLIPIDEMIA 04/23/2007  . GAD (generalized anxiety disorder) 04/23/2007  . DEPRESSION 04/23/2007  . GERD 04/23/2007  . HYPERTENSION, GESTATIONAL 04/23/2007  . GESTATIONAL DIABETES 04/23/2007  . DE QUERVAIN'S TENOSYNOVITIS 04/23/2007    Past Surgical History:  Procedure Laterality Date  . COLPOSCOPY    . TONSILLECTOMY AND ADENOIDECTOMY      OB History    Gravida Para Term Preterm AB Living   2 2 2     2    SAB TAB Ectopic Multiple Live Births                   Home Medications    Prior to Admission medications   Medication Sig Start Date End Date Taking? Authorizing Provider  albuterol (PROVENTIL HFA;VENTOLIN HFA) 108 (90 Base) MCG/ACT inhaler Inhale 2 puffs into the lungs every 6 (six) hours as needed for wheezing or shortness of breath. 12/18/16   Katy Apo, NP    benzocaine (ORAJEL) 10 % mucosal gel Use as directed 1 application as needed in the mouth or throat for mouth pain. 07/07/17   Tasia Catchings, Esmee Fallaw V, PA-C  benzonatate (TESSALON) 100 MG capsule Take 1 capsule (100 mg total) by mouth 3 (three) times daily as needed for cough. 12/18/16   Katy Apo, NP  ibuprofen (ADVIL,MOTRIN) 200 MG tablet Take 200 mg by mouth every 6 (six) hours as needed.    [provider]  naproxen (NAPROSYN) 500 MG tablet Take 1 tablet (500 mg total) 2 (two) times daily for 10 days by mouth. 07/07/17 07/17/17  Tasia Catchings, Kimaya Whitlatch V, PA-C  penicillin v potassium (VEETID) 500 MG tablet Take 1 tablet (500 mg total) 4 (four) times daily for 7 days by mouth. 07/07/17 07/14/17  Ok Edwards, PA-C    Family History Family History  Problem Relation Age of Onset  . Hypertension Mother   . Cancer Mother        LUNG CANCER/SMOKER  . Diabetes Father   . Hypertension Father   . Heart disease Father   . Hypertension Brother   . Diabetes Brother   . Cancer Maternal Uncle        ESOPHAGEAL CA  . Diabetes Cousin   . Heart disease Cousin   .  Cancer Maternal Grandfather        Brain  . Stroke Paternal Grandmother     Social History Social History   Tobacco Use  . Smoking status: Former Smoker    Types: Cigarettes    Last attempt to quit: 09/02/1990    Years since quitting: 26.8  . Smokeless tobacco: Never Used  Substance Use Topics  . Alcohol use: No    Alcohol/week: 0.0 oz  . Drug use: No     Allergies   Codeine; Erythromycin; and Levofloxacin   Review of Systems Review of Systems  Reason unable to perform ROS: See HPI as above.     Physical Exam Triage Vital Signs ED Triage Vitals [07/07/17 1843]  Enc Vitals Group     BP (!) 149/79     Pulse Rate 72     Resp 18     Temp 97.9 F (36.6 C)     Temp Source Oral     SpO2 100 %     Weight      Height      Head Circumference      Peak Flow      Pain Score 10     Pain Loc      Pain Edu?      Excl. in [place]?    No  data found.  Updated Vital Signs BP (!) 149/79 (BP Location: Right Arm)   Pulse 72   Temp 97.9 F (36.6 C) (Oral)   Resp 18   SpO2 100%   Physical Exam  Constitutional: She is oriented to person, place, and time. She appears well-developed and well-nourished. No distress.  HENT:  Head: Normocephalic and atraumatic.  Mouth/Throat: Uvula is midline, oropharynx is clear and moist and mucous membranes are normal. Abnormal dentition. Dental caries present.    Tenderness on palpation along gum lines.  Floor of mouth soft. No swelling of the face.   Eyes: Conjunctivae are normal. Pupils are equal, round, and reactive to light.  Neck: Normal range of motion. Neck supple. No thyromegaly present.  Neurological: She is alert and oriented to person, place, and time.     UC Treatments / Results  Labs (all labs ordered are listed, but only abnormal results are displayed) Labs Reviewed - No data to display  EKG  EKG Interpretation None       Radiology No results found.  Procedures Procedures (including critical care time)  Medications Ordered in UC Medications - No data to display   Initial Impression / Assessment and Plan / UC Course  I have reviewed the triage vital signs and the nursing notes.  Pertinent labs & imaging results that were available during my care of the patient were reviewed by me and considered in my medical decision making (see chart for details).    Start antibiotics for possible dental abscess. Symptomatic treatment as needed. Discussed with patient symptoms can return if dental problem is not addressed. Follow up with dentist for further evaluation and treatment of dental pain. Return precautions given.    Final Clinical Impressions(s) / UC Diagnoses   Final diagnoses:  Pain, dental    ED Discharge Orders        Ordered    penicillin v potassium (VEETID) 500 MG tablet  4 times daily     07/07/17 2049    naproxen (NAPROSYN) 500 MG tablet  2  times daily     07/07/17 2049    benzocaine (ORAJEL) 10 % mucosal gel  As needed     07/07/17 2049        Ok Edwards, PA-C 07/07/17 2052

## 2017-08-04 DIAGNOSIS — M84361A Stress fracture, right tibia, initial encounter for fracture: Secondary | ICD-10-CM | POA: Diagnosis not present

## 2017-10-31 ENCOUNTER — Other Ambulatory Visit (HOSPITAL_COMMUNITY)
Admission: RE | Admit: 2017-10-31 | Discharge: 2017-10-31 | Disposition: A | Discharge status: Home / Self Care | Payer: No Typology Code available for payment source | Source: Ambulatory Visit | Attending: Internal Medicine | Admitting: Internal Medicine

## 2017-10-31 ENCOUNTER — Ambulatory Visit (INDEPENDENT_AMBULATORY_CARE_PROVIDER_SITE_OTHER): Payer: No Typology Code available for payment source | Admitting: Internal Medicine

## 2017-10-31 ENCOUNTER — Ambulatory Visit (INDEPENDENT_AMBULATORY_CARE_PROVIDER_SITE_OTHER): Payer: No Typology Code available for payment source

## 2017-10-31 ENCOUNTER — Other Ambulatory Visit (INDEPENDENT_AMBULATORY_CARE_PROVIDER_SITE_OTHER): Payer: No Typology Code available for payment source

## 2017-10-31 ENCOUNTER — Encounter: Payer: Self-pay | Admitting: Internal Medicine

## 2017-10-31 VITALS — BP 136/68 | HR 77 | Temp 97.9°F | Ht 62.0 in | Wt 171.2 lb

## 2017-10-31 DIAGNOSIS — D649 Anemia, unspecified: Secondary | ICD-10-CM

## 2017-10-31 DIAGNOSIS — R87610 Atypical squamous cells of undetermined significance on cytologic smear of cervix (ASC-US): Secondary | ICD-10-CM

## 2017-10-31 DIAGNOSIS — N83209 Unspecified ovarian cyst, unspecified side: Secondary | ICD-10-CM

## 2017-10-31 DIAGNOSIS — D473 Essential (hemorrhagic) thrombocythemia: Secondary | ICD-10-CM

## 2017-10-31 DIAGNOSIS — Z113 Encounter for screening for infections with a predominantly sexual mode of transmission: Secondary | ICD-10-CM | POA: Diagnosis not present

## 2017-10-31 DIAGNOSIS — L659 Nonscarring hair loss, unspecified: Secondary | ICD-10-CM

## 2017-10-31 DIAGNOSIS — Z1389 Encounter for screening for other disorder: Secondary | ICD-10-CM | POA: Insufficient documentation

## 2017-10-31 DIAGNOSIS — E785 Hyperlipidemia, unspecified: Secondary | ICD-10-CM

## 2017-10-31 DIAGNOSIS — Z1329 Encounter for screening for other suspected endocrine disorder: Secondary | ICD-10-CM

## 2017-10-31 DIAGNOSIS — Z1283 Encounter for screening for malignant neoplasm of skin: Secondary | ICD-10-CM

## 2017-10-31 DIAGNOSIS — M79671 Pain in right foot: Secondary | ICD-10-CM

## 2017-10-31 DIAGNOSIS — D75839 Thrombocytosis, unspecified: Secondary | ICD-10-CM | POA: Insufficient documentation

## 2017-10-31 DIAGNOSIS — Z1231 Encounter for screening mammogram for malignant neoplasm of breast: Secondary | ICD-10-CM | POA: Diagnosis not present

## 2017-10-31 DIAGNOSIS — L219 Seborrheic dermatitis, unspecified: Secondary | ICD-10-CM | POA: Diagnosis not present

## 2017-10-31 LAB — CBC WITH DIFFERENTIAL/PLATELET
Basophils Absolute: 0 10*3/uL (ref 0.0–0.1)
Basophils Relative: 0.6 % (ref 0.0–3.0)
EOS PCT: 0.2 % (ref 0.0–5.0)
Eosinophils Absolute: 0 10*3/uL (ref 0.0–0.7)
HCT: 28.9 % — ABNORMAL LOW (ref 36.0–46.0)
Hemoglobin: 9.6 g/dL — ABNORMAL LOW (ref 12.0–15.0)
LYMPHS ABS: 1.6 10*3/uL (ref 0.7–4.0)
Lymphocytes Relative: 32.9 % (ref 12.0–46.0)
MCHC: 33.1 g/dL (ref 30.0–36.0)
MCV: 86.1 fl (ref 78.0–100.0)
MONOS PCT: 5.1 % (ref 3.0–12.0)
Monocytes Absolute: 0.2 10*3/uL (ref 0.1–1.0)
NEUTROS ABS: 2.9 10*3/uL (ref 1.4–7.7)
NEUTROS PCT: 61.2 % (ref 43.0–77.0)
PLATELETS: 477 10*3/uL — AB (ref 150.0–400.0)
RBC: 3.36 Mil/uL — ABNORMAL LOW (ref 3.87–5.11)
RDW: 16.1 % — ABNORMAL HIGH (ref 11.5–15.5)
WBC: 4.7 10*3/uL (ref 4.0–10.5)

## 2017-10-31 LAB — COMPREHENSIVE METABOLIC PANEL
ALBUMIN: 3.8 g/dL (ref 3.5–5.2)
ALT: 13 U/L (ref 0–35)
AST: 16 U/L (ref 0–37)
Alkaline Phosphatase: 89 U/L (ref 39–117)
BILIRUBIN TOTAL: 0.5 mg/dL (ref 0.2–1.2)
BUN: 14 mg/dL (ref 6–23)
CALCIUM: 9 mg/dL (ref 8.4–10.5)
CHLORIDE: 104 meq/L (ref 96–112)
CO2: 26 mEq/L (ref 19–32)
Creatinine, Ser: 0.59 mg/dL (ref 0.40–1.20)
GFR: 116.61 mL/min (ref >60.00)
Glucose, Bld: 92 mg/dL (ref 70–99)
Potassium: 3.6 mEq/L (ref 3.5–5.1)
Sodium: 136 mEq/L (ref 135–145)
Total Protein: 7.7 g/dL (ref 6.0–8.3)

## 2017-10-31 LAB — LIPID PANEL
CHOL/HDL RATIO: 3
CHOLESTEROL: 195 mg/dL (ref 0–200)
HDL: 61.9 mg/dL (ref >39.00)
LDL CALC: 116 mg/dL — AB (ref 0–99)
NonHDL: 133.33
Triglycerides: 89 mg/dL (ref 0.0–149.0)
VLDL: 17.8 mg/dL (ref 0.0–40.0)

## 2017-10-31 LAB — URINALYSIS, ROUTINE W REFLEX MICROSCOPIC
Bilirubin Urine: NEGATIVE
KETONES UR: NEGATIVE
Leukocytes, UA: NEGATIVE
Nitrite: NEGATIVE
PH: 5.5 (ref 5.0–8.0)
Total Protein, Urine: NEGATIVE
URINE GLUCOSE: NEGATIVE
UROBILINOGEN UA: 0.2 (ref 0.0–1.0)

## 2017-10-31 LAB — T4, FREE: Free T4: 0.62 ng/dL (ref 0.60–1.60)

## 2017-10-31 LAB — TSH: TSH: 2.04 u[IU]/mL (ref 0.35–4.50)

## 2017-10-31 MED ORDER — KETOCONAZOLE 2 % EX SHAM
1.0000 | MEDICATED_SHAMPOO | CUTANEOUS | 1 refills | Status: DC
Start: 2017-11-03 — End: 2018-07-27

## 2017-10-31 NOTE — Patient Instructions (Addendum)
I need to see which dermatologist is associated with [facility]  Xray right foot today  Referred to mammogram and OB/GYN in [facility] Please come for blood work this afternoon  I will have to see which eye doctor is associated with [facility]  Use nizoral shampoo to scalp 2x per day F/u in 2-3 weeks  Seborrheic Dermatitis, Adult Seborrheic dermatitis is a skin disease that causes red, scaly patches. It usually occurs on the scalp, and it is often called dandruff. The patches may appear on other parts of the body. Skin patches tend to appear where there are many oil glands in the skin. Areas of the body that are commonly affected include:  Scalp.  Skin folds of the body.  Ears.  Eyebrows.  Neck.  Face.  Armpits.  The bearded area of men's faces.  The condition may come and go for no known reason, and it is often long-lasting (chronic). What are the causes? The cause of this condition is not known. What increases the risk? This condition is more likely to develop in people who:  Have certain conditions, such as: ? HIV (human immunodeficiency virus). ? AIDS (acquired immunodeficiency syndrome). ? Parkinson disease. ? Mood disorders, such as depression.  Are 54-25 years old.  What are the signs or symptoms? Symptoms of this condition include:  Thick scales on the scalp.  Redness on the face or in the armpits.  Skin that is flaky. The flakes may be white or yellow.  Skin that seems oily or dry but is not helped with moisturizers.  Itching or burning in the affected areas.  How is this diagnosed? This condition is diagnosed with a medical history and physical exam. A sample of your skin may be tested (skin biopsy). You may need to see a skin specialist (dermatologist). How is this treated? There is no cure for this condition, but treatment can help to manage the symptoms. You may get treatment to remove scales, lower the risk of skin infection, and reduce swelling or itching.  Treatment may include:  Creams that reduce swelling and irritation (steroids).  Creams that reduce skin yeast.  Medicated shampoo, soaps, moisturizing creams, or ointments.  Medicated moisturizing creams or ointments.  Follow these instructions at home:  Apply over-the-counter and prescription medicines only as told by your health care provider.  Use any medicated shampoo, soaps, skin creams, or ointments only as told by your health care provider.  Keep all follow-up visits as told by your health care provider. This is important. Contact a health care provider if:  Your symptoms do not improve with treatment.  Your symptoms get worse.  You have new symptoms. This information is not intended to replace advice given to you by your health care provider. Make sure you discuss any questions you have with your health care provider. Document Released: 08/19/2005 Document Revised: 03/08/2016 Document Reviewed: 12/07/2015 Elsevier Interactive Patient Education  2018 [place].   Reactive Thrombocytosis Reactive thrombocytosis is when you have too many platelets (thrombocytes) in your blood. Platelets are tiny elements in the blood that stick together and form a clot (thrombus). Platelets help your body stop bleeding. Conditions that cause inflammation, such as cancer, may trigger your body to make more platelets than normal. This condition may also be called secondary thrombocytosis. What are the causes? Many things can cause this condition, including:  Having your spleen surgically removed (splenectomy).  Injury (trauma).  Certain infections.  Very bad bleeding.  Low red blood cell count from not  having enough iron (iron deficiency anemia).  Having a disease that destroys your red blood cells (hemolytic anemia).  Not having enough vitamin B-12.  Inflammatory bowel disease (Crohn disease or ulcerative colitis).  Cancer, especially lymphoma, breast, stomach, and ovarian  cancers.  Alcohol abuse.  Certain medicines.  What are the signs or symptoms?  It can be hard to tell the difference between symptoms of reactive thrombocytosis and symptoms of the underlying condition. If you have symptoms, they may include:  Weakness.  Headache.  Dizziness or confusion.  Chest pain or shortness of breath.  Tingling or burning in your hands or feet.  How is this diagnosed? This condition may be diagnosed based on routine blood tests or while you are being evaluated for another condition. You may also need tests to confirm the diagnosis. These may include:  Additionalblood tests.  A procedure to collect a sample of bone marrow (bone marrow aspiration).  How is this treated? Treatment for this condition depends on the cause. Your platelet count may return to normal after the underlying cause is treated. If your platelet count is very high, you may have to take medicine to prevent blood clots as told by your health care provider. Follow these instructions at home:  Take over-the-counter and prescription medicines only as told by your health care provider.  Work with your health care provider to: ? Treat the condition causing reactive thrombocytosis. ? Control any other conditions you may have, such as high blood pressure, high cholesterol, and diabetes.  Do not smoke. If you need help quitting, talk to your health care provider.  Keep all follow-up visits as told by your health care provider. This is important. Contact a health care provider if:  You have a headache that you cannot control.  You faint. Get help right away if:  You suddenly develop a headache, weakness, or drooping in your face.  You suddenly cannot speak or understand speech.  You have chest pain.  You have trouble breathing. This information is not intended to replace advice given to you by your health care provider. Make sure you discuss any questions you have with your health  care provider. Document Released: 05/10/2015 Document Revised: 01/25/2016 Document Reviewed: 11/23/2014 Elsevier Interactive Patient Education  [employer].

## 2017-10-31 NOTE — Progress Notes (Signed)
Pre visit review using our clinic review tool, if applicable. No additional management support is needed unless otherwise documented below in the visit note. 

## 2017-11-03 ENCOUNTER — Encounter: Payer: Self-pay | Admitting: Internal Medicine

## 2017-11-03 DIAGNOSIS — L659 Nonscarring hair loss, unspecified: Secondary | ICD-10-CM | POA: Insufficient documentation

## 2017-11-03 DIAGNOSIS — N83209 Unspecified ovarian cyst, unspecified side: Secondary | ICD-10-CM | POA: Insufficient documentation

## 2017-11-03 DIAGNOSIS — L219 Seborrheic dermatitis, unspecified: Secondary | ICD-10-CM | POA: Insufficient documentation

## 2017-11-03 DIAGNOSIS — M79671 Pain in right foot: Secondary | ICD-10-CM | POA: Insufficient documentation

## 2017-11-03 DIAGNOSIS — R87610 Atypical squamous cells of undetermined significance on cytologic smear of cervix (ASC-US): Secondary | ICD-10-CM | POA: Insufficient documentation

## 2017-11-03 LAB — HEPATITIS C ANTIBODY
HEP C AB: NONREACTIVE
SIGNAL TO CUT-OFF: 0.19 (ref <1.00)

## 2017-11-03 LAB — IRON,TIBC AND FERRITIN PANEL
%SAT: 4 % (calc) — ABNORMAL LOW (ref 11–50)
Ferritin: 13 ng/mL (ref 10–232)
IRON: 18 ug/dL — AB (ref 40–190)
TIBC: 469 mcg/dL (calc) — ABNORMAL HIGH (ref 250–450)

## 2017-11-03 LAB — RPR: RPR Ser Ql: NONREACTIVE

## 2017-11-03 LAB — HEPATITIS B SURFACE ANTIGEN: Hepatitis B Surface Ag: NONREACTIVE

## 2017-11-03 LAB — HEPATITIS B SURFACE ANTIBODY, QUANTITATIVE: Hepatitis B-Post: 60 m[IU]/mL (ref >=10)

## 2017-11-03 LAB — HSV 2 ANTIBODY, IGG

## 2017-11-03 LAB — HIV ANTIBODY (ROUTINE TESTING W REFLEX): HIV 1&2 Ab, 4th Generation: NONREACTIVE

## 2017-11-03 LAB — HSV 1 ANTIBODY, IGG: HSV 1 GLYCOPROTEIN G AB, IGG: 12.4 {index} — AB

## 2017-11-03 NOTE — Progress Notes (Addendum)
Chief Complaint  Patient presents with  . Establish Care   Establish care  1. C/o 2-3 years hair loss and scabs on scalp and scaling she stopped coloring her hair for this reason. Denies itching She also has [facility] MM and wants to see dermatology  2. She wants to see the eye doctor  3. Saw murphy and Noemi Chapel 08/04/17 for right lower extremity pain and had Xray need to get copy Xray per pt she stated bone was bigger in one area.  -she c/o right lateral foot pain today and a door hit her foot 1 week ago will Xray the foot today  4. She reports h/o elevated platelets and she is concerned.  5. F/u Dr. Verner Chol [facility] in [place] and needs to f/u for ASCUS/LSIL pap neg HPV  6. She is also due for mammogram    Review of Systems  Constitutional: Negative for weight loss.  HENT: Negative for hearing loss.   Eyes:       Vision problem needs eyes checked  Respiratory: Negative for shortness of breath.   Cardiovascular: Negative for chest pain.  Gastrointestinal: Negative for abdominal pain.  Musculoskeletal: Positive for joint pain.  Skin:       +hair loss +scaly scalp   Neurological: Negative for headaches.  Psychiatric/Behavioral: The patient is nervous/anxious.    Past Medical History:  Diagnosis Date  . Anxiety   . ASCUS favoring benign 12/2015   negative high risk HPV  . 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 Reflux    Past Surgical History:  Procedure Laterality Date  . COLPOSCOPY    . TONSILLECTOMY AND ADENOIDECTOMY     Family History  Problem Relation Age of Onset  . Hypertension Mother   . Cancer Mother        LUNG CANCER/SMOKER  . Diabetes Father   . Hypertension Father   . Heart disease Father   . Hypertension Brother   . Diabetes Brother   . Cancer Maternal Uncle        ESOPHAGEAL CA  . Diabetes Cousin   . Heart disease Cousin   . Cancer Maternal Grandfather        Brain  . Stroke Paternal Grandmother    Social History    Socioeconomic History  . Marital status: Divorced    Spouse name: Not on file  . Number of children: 2  . Years of education: Not on file  . Highest education level: Not on file  Social Needs  . Financial resource strain: Not on file  . Food insecurity - worry: Not on file  . Food insecurity - inability: Not on file  . Transportation needs - medical: Not on file  . Transportation needs - non-medical: Not on file  Occupational History  . Not on file  Tobacco Use  . Smoking status: Former Smoker    Types: Cigarettes    Last attempt to quit: 09/02/1990    Years since quitting: 27.1  . Smokeless tobacco: Never Used  Substance and Sexual Activity  . Alcohol use: No    Alcohol/week: 0.0 oz  . Drug use: No  . Sexual activity: Not Currently    Comment: 1st intercourse 47 yo-More than 5 partners  Other Topics Concern  . 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 Ferrous Sulfate (IRON SUPPLEMENT PO) Take by mouth.   Allergies  Allergen Reactions  .  Codeine     Breathing problems   . Erythromycin     unknown  . Levofloxacin     Heart racing    Recent Results (from the past 2160 hour(s))  HSV 2 antibody, IgG     Status: None   Collection Time: 10/31/17  2:24 PM  Result Value Ref Range   HSV 2 Glycoprotein G Ab, IgG <0.90 index    Comment:                           Index          Interpretation                           -----          --------------                           <0.90          Negative                           0.90-1.09      Equivocal                           >1.09          Positive . This assay utilizes recombinant type-specific antigens to differentiate HSV-1 from HSV-2 infections. A positive result cannot distinguish between recent and past infection. If recent HSV infection is suspected but the results are negative or equivocal, the assay should be repeated in 4-6 weeks. The performance characteristics of  the assay have not been established for pediatric populations, immunocompromised patients, or neonatal screening.   HSV 1 antibody, IgG     Status: Abnormal   Collection Time: 10/31/17  2:24 PM  Result Value Ref Range   HSV 1 Glycoprotein G Ab, IgG 12.40 (H) index    Comment:                           Index          Interpretation                           -----          --------------                           <0.90          Negative                           0.90-1.09      Equivocal                           >1.09          Positive . This assay utilizes recombinant type-specific antigens to differentiate HSV-1 from HSV-2 infections. A positive result cannot distinguish between recent and past infection. If recent HSV infection is suspected but the results are negative or equivocal, the assay should be repeated in 4-6 weeks. The performance characteristics of the assay have not been established for pediatric populations, immunocompromised patients, or neonatal screening.  Hepatitis C antibody     Status: None   Collection Time: 10/31/17  2:24 PM  Result Value Ref Range   Hepatitis C Ab NON-REACTIVE NON-REACTI   SIGNAL TO CUT-OFF 0.19 <1.00    Comment: . HCV antibody was non-reactive. There is no laboratory  evidence of HCV infection. . In most cases, no further action is required. However, if recent HCV exposure is suspected, a test for HCV RNA (test code [phone]) is suggested. . For additional information please refer to http://education.questdiagnostics.com/faq/FAQ22v1 (This link is being provided for informational/ educational purposes only.) .   Hepatitis B surface antigen     Status: None   Collection Time: 10/31/17  2:24 PM  Result Value Ref Range   Hepatitis B Surface Ag NON-REACTIVE NON-REACTI  Hepatitis B surface antibody     Status: None   Collection Time: 10/31/17  2:24 PM  Result Value Ref Range   Hepatitis B-Post 60 > OR = 10 mIU/mL    Comment:  . Patient has immunity to hepatitis B virus. . For additional information, please refer to http://education.questdiagnostics.com/faq/FAQ105 (This link is being provided for informational/ educational purposes only).   HIV antibody (with reflex)     Status: None   Collection Time: 10/31/17  2:24 PM  Result Value Ref Range   HIV 1&2 Ab, 4th Generation NON-REACTIVE NON-REACTI    Comment: HIV-1 antigen and HIV-1/HIV-2 antibodies were not detected. There is no laboratory evidence of HIV infection. Marland Kitchen PLEASE NOTE: This information has been disclosed to you from records whose confidentiality may be protected by state law.  If your state requires such protection, then the state law prohibits you from making any further disclosure of the information without the specific written consent of the person to whom it pertains, or as otherwise permitted by law. A general authorization for the release of medical or other information is NOT sufficient for this purpose. . For additional information please refer to http://education.questdiagnostics.com/faq/FAQ106 (This link is being provided for informational/ educational purposes only.) . Marland Kitchen The performance of this assay has not been clinically validated in patients less than 27 years old. .   RPR     Status: None   Collection Time: 10/31/17  2:24 PM  Result Value Ref Range   RPR Ser Ql NON-REACTIVE NON-REACTI  Iron, TIBC and Ferritin Panel     Status: Abnormal   Collection Time: 10/31/17  2:24 PM  Result Value Ref Range   Iron 18 (L) 40 - 190 mcg/dL   TIBC 469 (H) 250 - 450 mcg/dL (calc)   %SAT 4 (L) 11 - 50 % (calc)   Ferritin 13 10 - 232 ng/mL  Lipid panel     Status: Abnormal   Collection Time: 10/31/17  2:24 PM  Result Value Ref Range   Cholesterol 195 0 - 200 mg/dL    Comment: ATP III Classification       Desirable:  < 200 mg/dL               Borderline High:  200 - 239 mg/dL          High:  > = 240 mg/dL   Triglycerides 89.0 0.0 -  149.0 mg/dL    Comment: Normal:  <150 mg/dLBorderline High:  150 - 199 mg/dL   HDL 61.90 >39.00 mg/dL   VLDL 17.8 0.0 - 40.0 mg/dL   LDL Cholesterol 116 (H) 0 - 99 mg/dL   Total CHOL/HDL Ratio 3     Comment:  Men          Women1/2 Average Risk     3.4          3.3Average Risk          5.0          4.42X Average Risk          9.6          7.13X Average Risk          15.0          11.0                       NonHDL 133.33     Comment: NOTE:  Non-HDL goal should be 30 mg/dL higher than patient's LDL goal (i.e. LDL goal of < 70 mg/dL, would have non-HDL goal of < 100 mg/dL)  Urinalysis, Routine w reflex microscopic     Status: Abnormal   Collection Time: 10/31/17  2:24 PM  Result Value Ref Range   Color, Urine YELLOW Yellow;Lt. Yellow   APPearance CLEAR Clear   Specific Gravity, Urine >=1.030 (A) 1.000 - 1.030   pH 5.5 5.0 - 8.0   Total Protein, Urine NEGATIVE Negative   Urine Glucose NEGATIVE Negative   Ketones, ur NEGATIVE Negative   Bilirubin Urine NEGATIVE Negative   Hgb urine dipstick TRACE-INTACT (A) Negative   Urobilinogen, UA 0.2 0.0 - 1.0   Leukocytes, UA NEGATIVE Negative   Nitrite NEGATIVE Negative   WBC, UA 0-2/hpf 0-2/hpf   RBC / HPF 0-2/hpf 0-2/hpf   Mucus, UA Presence of (A) None  TSH     Status: None   Collection Time: 10/31/17  2:24 PM  Result Value Ref Range   TSH 2.04 0.35 - 4.50 uIU/mL  T4, free     Status: None   Collection Time: 10/31/17  2:24 PM  Result Value Ref Range   Free T4 0.62 0.60 - 1.60 ng/dL    Comment: Specimens from patients who are undergoing biotin therapy and /or ingesting biotin supplements may contain high levels of biotin.  The higher biotin concentration in these specimens interferes with this Free T4 assay.  Specimens that contain high levels  of biotin may cause false high results for this Free T4 assay.  Please interpret results in light of the total clinical presentation of the patient.    CBC with Differential/Platelet      Status: Abnormal   Collection Time: 10/31/17  2:24 PM  Result Value Ref Range   WBC 4.7 4.0 - 10.5 K/uL   RBC 3.36 (L) 3.87 - 5.11 Mil/uL   Hemoglobin 9.6 (L) 12.0 - 15.0 g/dL   HCT 28.9 (L) 36.0 - 46.0 %   MCV 86.1 78.0 - 100.0 fl   MCHC 33.1 30.0 - 36.0 g/dL   RDW 16.1 (H) 11.5 - 15.5 %   Platelets 477.0 (H) 150.0 - 400.0 K/uL   Neutrophils Relative % 61.2 43.0 - 77.0 %   Lymphocytes Relative 32.9 12.0 - 46.0 %   Monocytes Relative 5.1 3.0 - 12.0 %   Eosinophils Relative 0.2 0.0 - 5.0 %   Basophils Relative 0.6 0.0 - 3.0 %   Neutro Abs 2.9 1.4 - 7.7 K/uL   Lymphs Abs 1.6 0.7 - 4.0 K/uL   Monocytes Absolute 0.2 0.1 - 1.0 K/uL   Eosinophils Absolute 0.0 0.0 - 0.7 K/uL   Basophils Absolute 0.0 0.0 - 0.1 K/uL  Comprehensive metabolic panel  Status: None   Collection Time: 10/31/17  2:24 PM  Result Value Ref Range   Sodium 136 135 - 145 mEq/L   Potassium 3.6 3.5 - 5.1 mEq/L   Chloride 104 96 - 112 mEq/L   CO2 26 19 - 32 mEq/L   Glucose, Bld 92 70 - 99 mg/dL   BUN 14 6 - 23 mg/dL   Creatinine, Ser 0.59 0.40 - 1.20 mg/dL   Total Bilirubin 0.5 0.2 - 1.2 mg/dL   Alkaline Phosphatase 89 39 - 117 U/L   AST 16 0 - 37 U/L   ALT 13 0 - 35 U/L   Total Protein 7.7 6.0 - 8.3 g/dL   Albumin 3.8 3.5 - 5.2 g/dL   Calcium 9.0 8.4 - 10.5 mg/dL   GFR 116.61 >60.00 mL/min   Objective  Body mass index is 31.31 kg/m. Wt Readings from Last 3 Encounters:  10/31/17 171 lb 3.2 oz (77.7 kg)  12/18/16 167 lb (75.8 kg)  12/14/16 167 lb (75.8 kg)   Temp Readings from Last 3 Encounters:  10/31/17 97.9 F (36.6 C) (Oral)  07/07/17 97.9 F (36.6 C) (Oral)  12/18/16 98.9 F (37.2 C) (Oral)   BP Readings from Last 3 Encounters:  10/31/17 136/68  07/07/17 (!) 149/79  12/18/16 (!) 152/88   Pulse Readings from Last 3 Encounters:  10/31/17 77  07/07/17 72  12/18/16 (!) 105   O2 sat room air 98%  Physical Exam  Constitutional: She is oriented to person, place, and time and  well-developed, well-nourished, and in no distress. Vital signs are normal.  HENT:  Head: Normocephalic and atraumatic.  Mouth/Throat: Oropharynx is clear and moist and mucous membranes are normal.  Eyes: Conjunctivae are normal. Pupils are equal, round, and reactive to light.  Cardiovascular: Normal rate, regular rhythm and normal heart sounds.  Pulmonary/Chest: Effort normal and breath sounds normal.  Abdominal: Soft. Bowel sounds are normal. There is no tenderness.  Musculoskeletal:  Right lateral foot ttp  Right lower leg lateral leg ttp   Neurological: She is alert and oriented to person, place, and time. Gait normal. Gait normal.  Skin: Skin is warm, dry and intact.  Scaly scalp ? seb derm   Psychiatric: Mood, memory, affect and judgment normal.  Nursing note and vitals reviewed.   Assessment   1. C/w seb dermatitis to scalp and c/w hair loss and FH MM 2. Right leg Xray abnormal 08/04/17 and right lateral foot pain  3. H/o thrombocytosis, anemia  4. H/o abnormal pap ASCUS/LSIL last pap neg HPV, h/o ovarian mass  5. HM Plan  1. Trial of nizoral shampoo  Will refer to dermatology for skin check in [place]  tbse and assess other issues Dr. Denna Haggard if available needs to be [facility]ologist  2. Need to get copy of Xray [facility] ortho in [place]  Pt did not f/u [facility] Xray right foot today  Consider check vitamin D in future  3. Check anemia panel and CBC today  4. Refer back to Dr. Betsy Pries in [facility]  5.   Flu shot had 05/2017  Tdap per pt had 02/2016 need to confirm a tf/u  Check CMET, CBC, TSH, UA, lipid, anemia panel STD check  Referred OB/GYN see above h/o abnormal pap LMP 10/19/17 Dr. 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   Referred mammo [facility] in [place]  Consider eye exam referral in future in [facility] and ortho in network see above   "I spent 35 minutes face-to face with patient with greater than 50% of time spent counseling and/or in coordination of care in plan of care, referrals and reviewing records and PMH, FH.    Reviewed [facility] ortho notes 11/28/17 and labs platelets 529 and vit D 17 low   Provider: Dr. Olivia Mackie McLean-[facility]oc[facility]

## 2017-11-04 LAB — URINE CYTOLOGY ANCILLARY ONLY
Chlamydia: NEGATIVE
NEISSERIA GONORRHEA: NEGATIVE

## 2017-11-07 ENCOUNTER — Ambulatory Visit (INDEPENDENT_AMBULATORY_CARE_PROVIDER_SITE_OTHER): Payer: Self-pay | Admitting: Emergency Medicine

## 2017-11-07 VITALS — BP 110/90 | HR 88 | Temp 98.3°F | Resp 18

## 2017-11-07 DIAGNOSIS — K047 Periapical abscess without sinus: Secondary | ICD-10-CM

## 2017-11-07 DIAGNOSIS — S025XXB Fracture of tooth (traumatic), initial encounter for open fracture: Secondary | ICD-10-CM

## 2017-11-07 MED ORDER — PENICILLIN V POTASSIUM 500 MG PO TABS: 500.0000 mg | ORAL_TABLET | Freq: Four times a day (QID) | ORAL | 0 refills | Status: DC

## 2017-11-07 NOTE — Progress Notes (Signed)
S: [name] is a 47 y.o. female who presents for evaluation of a broken tooth and dental pain. States she broke her tooth one month ago and has had progressively worsening pain over past 48 hours. She has been unable to see a dentist for this problem. Pain is worsened with pressure and relieved with tylenol. Otherwise reports good health.  Review of Systems  Constitutional: Negative for chills and fever.  HENT:       Dental pain   Respiratory: Negative.   Cardiovascular: Negative.   Gastrointestinal: Negative.   Musculoskeletal: Negative.   Neurological: Negative.    O: Vitals:   11/07/17 1857  BP: 110/90  Pulse: 88  Resp: 18  Temp: 98.3 F (36.8 C)  SpO2: 98%   Physical Exam  Constitutional: She appears well-developed and well-nourished. No distress.  HENT:  Head: Normocephalic.  Right Ear: External ear normal.  Left Ear: External ear normal.  Mouth/Throat: Uvula is midline. Abnormal dentition. Dental caries present.    Eyes: Conjunctivae are normal.  Cardiovascular: Normal rate and regular rhythm.  Pulmonary/Chest: Effort normal and breath sounds normal.  Lymphadenopathy:       Head (right side): Tonsillar adenopathy present. No submental, no submandibular and no preauricular adenopathy present.       Head (left side): Submandibular and tonsillar adenopathy present. No submental and no preauricular adenopathy present.    She has cervical adenopathy.  Neurological: She is alert.  Skin: Skin is warm. She is not diaphoretic.  Nursing note and vitals reviewed.   A: 1. Open fracture of tooth, initial encounter   2. Dental infection     P: 1. Open fracture of tooth, initial encounter Follow up with dentist   2. Dental infection PCN, tylenol PRN, follow up with dentist for further evaluation.

## 2017-11-07 NOTE — Patient Instructions (Signed)
Dental Abscess A dental abscess is pus in or around a tooth. Follow these instructions at home:  Take medicines only as told by your dentist.  If you were prescribed antibiotic medicine, finish all of it even if you start to feel better.  Rinse your mouth (gargle) often with salt water.  Do not drive or use heavy machinery, like a lawn mower, while taking pain medicine.  Do not apply heat to the outside of your mouth.  Keep all follow-up visits as told by your dentist. This is important. Contact a doctor if:  Your pain is worse, and medicine does not help. Get help right away if:  You have a fever or chills.  Your symptoms suddenly get worse.  You have a very bad headache.  You have problems breathing or swallowing.  You have trouble opening your mouth.  You have puffiness (swelling) in your neck or around your eye. This information is not intended to replace advice given to you by your health care provider. Make sure you discuss any questions you have with your health care provider. Document Released: 01/03/2015 Document Revised: 01/25/2016 Document Reviewed: 08/16/2014 Elsevier Interactive Patient Education  2018 [employer]  

## 2017-11-13 ENCOUNTER — Encounter: Payer: Self-pay | Admitting: Internal Medicine

## 2017-11-13 ENCOUNTER — Ambulatory Visit: Payer: Self-pay | Admitting: Internal Medicine

## 2017-11-13 ENCOUNTER — Other Ambulatory Visit: Payer: Self-pay | Admitting: Internal Medicine

## 2017-11-13 DIAGNOSIS — D509 Iron deficiency anemia, unspecified: Secondary | ICD-10-CM | POA: Insufficient documentation

## 2017-11-13 DIAGNOSIS — M79671 Pain in right foot: Secondary | ICD-10-CM

## 2017-11-13 DIAGNOSIS — D508 Other iron deficiency anemias: Secondary | ICD-10-CM

## 2017-11-18 ENCOUNTER — Telehealth: Payer: Self-pay | Admitting: Internal Medicine

## 2017-11-18 NOTE — Telephone Encounter (Signed)
Rec'd from [facility] forwarded 3 pages to Dr Georgia Lopes

## 2017-11-27 ENCOUNTER — Telehealth: Payer: Self-pay | Admitting: Internal Medicine

## 2017-11-28 ENCOUNTER — Encounter: Payer: Self-pay | Admitting: Internal Medicine

## 2017-11-28 ENCOUNTER — Ambulatory Visit: Payer: Self-pay | Admitting: Podiatry

## 2017-12-01 MED FILL — VIT D2 1.25 MG (50,000 UNIT: 1.25 MG | 28 days supply | Qty: 8 | Fill #0 | Status: TO

## 2017-12-17 ENCOUNTER — Other Ambulatory Visit: Payer: Self-pay | Admitting: Internal Medicine

## 2017-12-17 DIAGNOSIS — E559 Vitamin D deficiency, unspecified: Secondary | ICD-10-CM | POA: Insufficient documentation

## 2017-12-17 DIAGNOSIS — T148XXA Other injury of unspecified body region, initial encounter: Secondary | ICD-10-CM

## 2017-12-17 DIAGNOSIS — D473 Essential (hemorrhagic) thrombocythemia: Secondary | ICD-10-CM

## 2017-12-17 DIAGNOSIS — D75839 Thrombocytosis, unspecified: Secondary | ICD-10-CM

## 2017-12-17 DIAGNOSIS — D509 Iron deficiency anemia, unspecified: Secondary | ICD-10-CM

## 2017-12-17 MED ORDER — CHOLECALCIFEROL 1.25 MG (50000 UT) PO CAPS: 50000.0000 [IU] | ORAL_CAPSULE | ORAL | 1 refills | Status: DC

## 2017-12-18 ENCOUNTER — Telehealth: Payer: Self-pay | Admitting: Hematology and Oncology

## 2017-12-18 NOTE — Telephone Encounter (Signed)
Spoke with patient regarding appointment date/time/location & phone #  °

## 2017-12-19 ENCOUNTER — Telehealth: Payer: Self-pay | Admitting: Internal Medicine

## 2017-12-19 DIAGNOSIS — E2839 Other primary ovarian failure: Secondary | ICD-10-CM

## 2017-12-19 NOTE — Telephone Encounter (Signed)
Copied from [facility] [place] [phone]. Topic: General - Other >> Dec 19, 2017 10:58 AM Margot Ables wrote: DX bone fracture is not covered by insurance unless it is specifically spinal fracture. DX will need to be updated on bone density order.

## 2017-12-22 NOTE — Telephone Encounter (Signed)
Try Estrogen deficiency  M28.39  To get DEXA covered   Thanks [place]

## 2017-12-23 NOTE — Telephone Encounter (Signed)
Sent [facility] code to the [facility]

## 2017-12-24 NOTE — Telephone Encounter (Signed)
Dr. Olivia Mackie, the breast center needs you to enter the new order in Epic with this dx code. They said M28.39 will work.

## 2017-12-24 NOTE — Telephone Encounter (Signed)
Reordered   [place]

## 2017-12-25 ENCOUNTER — Encounter: Payer: Self-pay | Admitting: Internal Medicine

## 2018-01-06 ENCOUNTER — Encounter: Payer: Self-pay | Admitting: Hematology and Oncology

## 2018-01-07 ENCOUNTER — Other Ambulatory Visit: Payer: Self-pay | Admitting: Internal Medicine

## 2018-01-07 DIAGNOSIS — Z1231 Encounter for screening mammogram for malignant neoplasm of breast: Secondary | ICD-10-CM

## 2018-01-13 ENCOUNTER — Telehealth: Payer: Self-pay | Admitting: Hematology and Oncology

## 2018-01-13 ENCOUNTER — Inpatient Hospital Stay: Payer: No Typology Code available for payment source

## 2018-01-13 ENCOUNTER — Encounter: Payer: Self-pay | Admitting: Hematology and Oncology

## 2018-01-13 ENCOUNTER — Inpatient Hospital Stay
Payer: No Typology Code available for payment source | Attending: Hematology and Oncology | Admitting: Hematology and Oncology

## 2018-01-13 VITALS — BP 157/75 | HR 62 | Temp 98.0°F | Resp 17 | Ht 62.0 in | Wt 173.4 lb

## 2018-01-13 DIAGNOSIS — D508 Other iron deficiency anemias: Secondary | ICD-10-CM

## 2018-01-13 DIAGNOSIS — D509 Iron deficiency anemia, unspecified: Secondary | ICD-10-CM | POA: Insufficient documentation

## 2018-01-13 DIAGNOSIS — D649 Anemia, unspecified: Secondary | ICD-10-CM

## 2018-01-13 DIAGNOSIS — E559 Vitamin D deficiency, unspecified: Secondary | ICD-10-CM

## 2018-01-13 LAB — CMP (CANCER CENTER ONLY)
ALT: 15 U/L (ref 0–55)
AST: 19 U/L (ref 5–34)
Albumin: 3.9 g/dL (ref 3.5–5.0)
Alkaline Phosphatase: 116 U/L (ref 40–150)
Anion gap: 8 (ref 3–11)
BUN: 14 mg/dL (ref 7–26)
CHLORIDE: 105 mmol/L (ref 98–109)
CO2: 26 mmol/L (ref 22–29)
CREATININE: 0.78 mg/dL (ref 0.60–1.10)
Calcium: 9.7 mg/dL (ref 8.4–10.4)
Glucose, Bld: 94 mg/dL (ref 70–140)
POTASSIUM: 3.9 mmol/L (ref 3.5–5.1)
Sodium: 139 mmol/L (ref 136–145)
Total Bilirubin: 0.3 mg/dL (ref 0.2–1.2)
Total Protein: 8.2 g/dL (ref 6.4–8.3)

## 2018-01-13 LAB — CBC WITH DIFFERENTIAL (CANCER CENTER ONLY)
Basophils Absolute: 0 10*3/uL (ref 0.0–0.1)
Basophils Relative: 1 %
EOS PCT: 1 %
Eosinophils Absolute: 0 10*3/uL (ref 0.0–0.5)
HCT: 31.6 % — ABNORMAL LOW (ref 34.8–46.6)
Hemoglobin: 10 g/dL — ABNORMAL LOW (ref 11.6–15.9)
LYMPHS ABS: 1.3 10*3/uL (ref 0.9–3.3)
Lymphocytes Relative: 28 %
MCH: 25.3 pg (ref 25.1–34.0)
MCHC: 31.8 g/dL (ref 31.5–36.0)
MCV: 79.8 fL (ref 79.5–101.0)
MONO ABS: 0.3 10*3/uL (ref 0.1–0.9)
MONOS PCT: 6 %
Neutro Abs: 3 10*3/uL (ref 1.5–6.5)
Neutrophils Relative %: 64 %
PLATELETS: 444 10*3/uL — AB (ref 145–400)
RBC: 3.96 MIL/uL (ref 3.70–5.45)
RDW: 19.4 % — AB (ref 11.2–14.5)
WBC Count: 4.6 10*3/uL (ref 3.9–10.3)

## 2018-01-13 LAB — IRON AND TIBC
IRON: 24 ug/dL — AB (ref 41–142)
Saturation Ratios: 5 % — ABNORMAL LOW (ref 21–57)
TIBC: 454 ug/dL — AB (ref 236–444)
UIBC: 429 ug/dL

## 2018-01-13 LAB — FERRITIN: Ferritin: 17 ng/mL (ref 9–269)

## 2018-01-13 LAB — VITAMIN B12: Vitamin B-12: 263 pg/mL (ref 180–914)

## 2018-01-13 LAB — FOLATE: Folate: 13.7 ng/mL (ref >5.9)

## 2018-01-13 NOTE — Telephone Encounter (Signed)
Appointments scheduled AVS/Calendar printed per 5/14 los °

## 2018-01-14 ENCOUNTER — Other Ambulatory Visit: Payer: Self-pay | Admitting: Internal Medicine

## 2018-01-14 DIAGNOSIS — N644 Mastodynia: Secondary | ICD-10-CM

## 2018-01-14 LAB — VITAMIN D 25 HYDROXY (VIT D DEFICIENCY, FRACTURES): Vit D, 25-Hydroxy: 20.6 ng/mL — ABNORMAL LOW (ref 30.0–100.0)

## 2018-01-16 ENCOUNTER — Ambulatory Visit: Payer: Self-pay | Admitting: Gynecology

## 2018-01-16 LAB — METHYLMALONIC ACID, SERUM: METHYLMALONIC ACID, QUANTITATIVE: 93 nmol/L (ref 0–378)

## 2018-01-19 ENCOUNTER — Other Ambulatory Visit: Payer: Self-pay | Admitting: Hematology

## 2018-01-19 ENCOUNTER — Other Ambulatory Visit: Payer: Self-pay | Admitting: Hematology and Oncology

## 2018-01-19 MED FILL — VIT D2 1.25 MG (50,000 UNIT: 1.25 MG | 28 days supply | Qty: 8 | Fill #0

## 2018-01-19 NOTE — Progress Notes (Signed)
[facility] New Visit:  Assessment: Anemia 47 y.o. menorrhagia female with previous history of iron deficiency anemia due to with uterine polyp as well as a mass in the right ovary detected about 2 years ago.  Patient elected not to have it further evaluated or addressed so far.  She also does not show good adherence to the recommended iron replacement therapy based on her verbal recall.  Current presentation with progressive normocytic normochromic anemia with concurrent thrombocytosis of mild severity is consistent with blood loss anemia development and reactive thrombocytosis.  Patient has no symptoms to suggest myeloproliferative neoplasm at this time.  Assessment will be focused on addressing the iron depletion as well as returning patient to the proper follow-up for her ovarian mass.  Plan: - Repeat lab work today as outlined below. -Return to clinic in 1 week for discussion of the results and potential iron infusion. -I have offered gynecological consultation request, but patient prefers to contact gynecology directly on her own.   Voice recognition software was used and creation of this note. Despite my best effort at editing the text, some misspelling/errors may have occurred. Orders Placed This Encounter  Procedures  . CBC with Differential (Cancer Center Only)    Standing Status:   Future    Number of Occurrences:   1    Standing Expiration Date:   01/14/2019  . CMP ([facility] only)    Standing Status:   Future    Number of Occurrences:   1    Standing Expiration Date:   01/14/2019  . Iron and TIBC    Standing Status:   Future    Number of Occurrences:   1    Standing Expiration Date:   01/14/2019  . Ferritin    Standing Status:   Future    Number of Occurrences:   1    Standing Expiration Date:   01/14/2019  . Vitamin D 25 hydroxy    Standing Status:   Future    Number of Occurrences:   1    Standing Expiration Date:   01/13/2019  . Folate, Serum     Standing Status:   Future    Number of Occurrences:   1    Standing Expiration Date:   01/13/2019  . Vitamin B12    Standing Status:   Future    Number of Occurrences:   1    Standing Expiration Date:   01/13/2019  . Methylmalonic acid, serum    Standing Status:   Future    Number of Occurrences:   1    Standing Expiration Date:   01/13/2019    All questions were answered.  . The patient knows to call the clinic with any problems, questions or concerns.  This note was electronically signed.    History of Presenting Illness Lakrista Lobue 47 y.o. presenting to the [facility] for elevated platelets and iron deficiency anemia, referred by Dr Nino Glow McLean-Scocuzza.  Patient's past medical history is significant for anxiety, low-grade cervical dysplasia, and GERD.  Of note, patient was also found to have a right ovarian mass and endometrial polyp in 2017 and was supposed to follow-up with gynecology but never addressed those issues despite repeated recommendations from the primary care providers.  Past family history significant for mother with lung cancer related to tobacco smoking, maternal uncle with esophageal cancer, and maternal grandfather with brain tumor.  Patient is a former smoker but quit in 1992.  She does  not drink alcohol.  Patient complains of fatigue, low energy levels, hair loss and scalp scabs, but denies any fevers, chills, night sweats.  Denies unexpected weight loss or weight gain.  Stable energy level.  No nausea, vomiting, abdominal pain, diarrhea, constipation, melena, or hematochezia.  No dysuria or hematuria.  Patient continues to have heavy menstrual periods.  Denies unusual food cravings, lightheadedness, or dizziness.  Previously was instructed to take slow iron twice daily, but has not been doing that for a variety of reasons.  Oncological/hematological History: --Labs, 12/03/08: Hgb 13.7, MCV 88.2, MCH     ..., MCHC 34.0, RDW 14.0, Plt 275; --Labs, 12/14/12:  Hgb 12.0, MCV 83.3, MCH 27.9, MCHC 33.5, RDW 15.4, Plt 396;  --Labs, 12/06/15: Hgb 10.3, MCV 80.8, MCH 25.0, MCHC 30.9, RDW 11.3, Plt 422; --Labs, 10/31/17: Hgb   9.6, MCV 86.1, MCH     ..., MCHC 33.1, RDW 16.1, Plt 477; Fe 18, FESat 4%, TIBC 469, Ferritin 13; TSH 2.04  Medical History: Past Medical History:  Diagnosis Date  . Anxiety   . ASCUS favoring benign 12/2015   negative high risk HPV  . Chicken pox   . GERD (gastroesophageal reflux disease)    during pregnancy   . 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 Hyperlipidemia   . Hypertension    during pregnancy   . 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 Reflux     Surgical History: Past Surgical History:  Procedure Laterality Date  . COLPOSCOPY    . TONSILLECTOMY AND ADENOIDECTOMY      Family History: Family History  Problem Relation Age of Onset  . Hypertension Mother   . Cancer Mother        LUNG CANCER/SMOKER  . Depression Mother   . Mental illness Mother   . Diabetes Father   . Hypertension Father   . Heart disease Father   . Hyperlipidemia Father   . Stroke Father   . Hypertension Brother   . Diabetes Brother   . Depression Brother   . Hyperlipidemia Brother   . Cancer Maternal Uncle        ESOPHAGEAL CA  . Diabetes Cousin   . Heart disease Cousin   . Cancer Maternal Grandfather        Brain  . Stroke Paternal Grandmother   . Depression Maternal Grandmother   . Mental retardation Maternal Grandmother   . Stroke Paternal Grandfather     Social History: Social History   Socioeconomic History  . Marital status: Divorced    Spouse name: Not on file  . Number of children: 2  . Years of education: Not on file  . Highest education level: Not on file  Occupational History  . Not on file  Social Needs  . Financial resource strain: Not on file  . Food insecurity:    Worry: Not on file    Inability: Not on file  . Transportation needs:    Medical: Not on file     Non-medical: Not on file  Tobacco Use  . Smoking status: Former Smoker    Types: Cigarettes    Last attempt to quit: 09/02/1990    Years since quitting: 27.4  . Smokeless tobacco: Never Used  . Tobacco comment: smoked socially in high school   Substance and Sexual Activity  . Alcohol use: Yes    Alcohol/week: 0.0 oz    Comment: occasionally   . Drug use: No  .  Sexual activity: Not on file    Comment: 1st intercourse 47 yo-More than 5 partners  Lifestyle  . Physical activity:    Days per week: Not on file    Minutes per session: Not on file  . Stress: Not on file  Relationships  . Social connections:    Talks on phone: Not on file    Gets together: Not on file    Attends religious service: Not on file    Active member of club or organization: Not on file    Attends meetings of clubs or organizations: Not on file    Relationship status: Not on file  . Intimate partner violence:    Fear of current or ex partner: Not on file    Emotionally abused: Not on file    Physically abused: Not on file    Forced sexual activity: Not on file  Other Topics Concern  . 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 Codeine     Breathing problems   . Erythromycin     GI upset   . Levofloxacin     Heart racing     Medications:  Current Outpatient Medications  Medication Sig Dispense Refill  . Cholecalciferol 50000 units capsule Take 1 capsule (50,000 Units total) by mouth once a week. 13 capsule 1  . Ferrous Sulfate (IRON SUPPLEMENT PO) Take by mouth.    Marland Kitchen ketoconazole (NIZORAL) 2 % shampoo Apply 1 application topically 2 (two) times a week. Let stand on scalp then rinse after 5 minuts (Patient not taking: Reported on 11/07/2017) 120 mL 1  . penicillin v potassium (VEETID) 500 MG tablet Take 1 tablet (500 mg total) by mouth 4  (four) times daily. 40 tablet 0   No current facility-administered medications for this visit.     Review of Systems: Review of Systems  All other systems reviewed and are negative.    PHYSICAL EXAMINATION Blood pressure (!) 157/75, pulse 62, temperature 98 F (36.7 C), temperature source Oral, resp. rate 17, height '5\' 2"'  (1.575 m), weight 173 lb 6.4 oz (78.7 kg), SpO2 100 %.  ECOG PERFORMANCE STATUS: 0 - Asymptomatic  Physical Exam  Constitutional: She is oriented to person, place, and time. She appears well-developed and well-nourished. No distress.  HENT:  Head: Normocephalic and atraumatic.  Mouth/Throat: Oropharynx is clear and moist. No oropharyngeal exudate.  Eyes: Pupils are equal, round, and reactive to light. Conjunctivae and EOM are normal. No scleral icterus.  Neck: Normal range of motion. Neck supple. No thyromegaly present.  Cardiovascular: Normal rate, regular rhythm, normal heart sounds and intact distal pulses. Exam reveals no gallop and no friction rub.  No murmur heard. Pulmonary/Chest: Effort normal and breath sounds normal. No respiratory distress. She has no wheezes. She exhibits no tenderness.  Abdominal: Soft. Bowel sounds are normal. She exhibits no distension and no mass. There is no tenderness. There is no guarding.  Musculoskeletal: She exhibits no edema.  Lymphadenopathy:    She has no cervical adenopathy.  Neurological: She is alert and oriented to person, place, and time. She displays normal reflexes. No cranial nerve deficit or sensory deficit.  Skin: Skin is warm and dry. No rash noted. She is not diaphoretic. No erythema. There is pallor.  LABORATORY DATA: I have personally reviewed the data as listed: Appointment on 01/13/2018  Component Date Value Ref Range Status  . WBC Count 01/13/2018 4.6  3.9 - 10.3 K/uL Final  . RBC 01/13/2018 3.96  3.70 - 5.45 MIL/uL Final  . Hemoglobin 01/13/2018 10.0* 11.6 - 15.9 g/dL Final  . HCT 01/13/2018  31.6* 34.8 - 46.6 % Final  . MCV 01/13/2018 79.8  79.5 - 101.0 fL Final  . MCH 01/13/2018 25.3  25.1 - 34.0 pg Final  . MCHC 01/13/2018 31.8  31.5 - 36.0 g/dL Final  . RDW 01/13/2018 19.4* 11.2 - 14.5 % Final  . Platelet Count 01/13/2018 444* 145 - 400 K/uL Final  . Neutrophils Relative % 01/13/2018 64  % Final  . Neutro Abs 01/13/2018 3.0  1.5 - 6.5 K/uL Final  . Lymphocytes Relative 01/13/2018 28  % Final  . Lymphs Abs 01/13/2018 1.3  0.9 - 3.3 K/uL Final  . Monocytes Relative 01/13/2018 6  % Final  . Monocytes Absolute 01/13/2018 0.3  0.1 - 0.9 K/uL Final  . Eosinophils Relative 01/13/2018 1  % Final  . Eosinophils Absolute 01/13/2018 0.0  0.0 - 0.5 K/uL Final  . Basophils Relative 01/13/2018 1  % Final  . Basophils Absolute 01/13/2018 0.0  0.0 - 0.1 K/uL Final   Performed at [facility], [place] [address], [place], [place] [address]  . Methylmalonic Acid, Quantitative 01/13/2018 93  0 - 378 nmol/L Final  . Disclaimer: 01/13/2018 Comment   Final   Comment: (NOTE) This test was developed and its performance characteristics determined by [facility]. It has not been cleared or approved by the Food and Drug Administration. Performed At: [facility] [place], [place] [id number] Rush Farmer MD [id number] Performed at [facility], [place] [address], [place], [place] [address]   . Sodium 01/13/2018 139  136 - 145 mmol/L Final  . Potassium 01/13/2018 3.9  3.5 - 5.1 mmol/L Final  . Chloride 01/13/2018 105  98 - 109 mmol/L Final  . CO2 01/13/2018 26  22 - 29 mmol/L Final  . Glucose, Bld 01/13/2018 94  70 - 140 mg/dL Final  . BUN 01/13/2018 14  7 - 26 mg/dL Final  . Creatinine 01/13/2018 0.78  0.60 - 1.10 mg/dL Final  . Calcium 01/13/2018 9.7  8.4 - 10.4 mg/dL Final  . Total Protein 01/13/2018 8.2  6.4 - 8.3 g/dL Final  . Albumin 01/13/2018 3.9  3.5 - 5.0 g/dL Final  . AST 01/13/2018 19  5 - 34 U/L Final  . ALT  01/13/2018 15  0 - 55 U/L Final  . Alkaline Phosphatase 01/13/2018 116  40 - 150 U/L Final  . Total Bilirubin 01/13/2018 0.3  0.2 - 1.2 mg/dL Final  . GFR, Est Non Af Am 01/13/2018 >60  >60 mL/min Final  . GFR, Est AFR Am 01/13/2018 >60  >60 mL/min Final   Comment: (NOTE) The eGFR has been calculated using the CKD EPI equation. This calculation has not been validated in all clinical situations. eGFR's persistently <60 mL/min signify possible Chronic Kidney Disease.   Georgiann Hahn gap 01/13/2018 8  3 - 11 Final   Performed at [facility], [place] [address], [place], [place] [address]  . Iron 01/13/2018 24* 41 - 142 ug/dL Final  . TIBC 01/13/2018 454* 236 - 444 ug/dL Final  . Saturation Ratios 01/13/2018 5* 21 - 57 % Final  . UIBC 01/13/2018 429  ug/dL Final   Performed at [facility], [place] [address] [place], [place] [address]  . Ferritin 01/13/2018 17  9 - 269 ng/mL Final   Performed at [facility], [place] [address], [place], [place] [address]  . Vit D, 25-Hydroxy 01/13/2018 20.6* 30.0 - 100.0 ng/mL Final   Comment: (NOTE) Vitamin D deficiency has been defined by the Armstrong practice guideline as a level of serum 25-OH vitamin D less than 20 ng/mL (1,2). The Endocrine Society went on to further define vitamin D insufficiency as a level between 21 and 29 ng/mL (2). 1. IOM (Institute of Medicine). 2010. Dietary reference   intakes for calcium and D. [place]: The   [employer]. 2. Holick MF, Binkley [place], Bischoff-Ferrari HA, et al.   Evaluation, treatment, and prevention of vitamin D   deficiency: an Endocrine Society clinical practice   guideline. JCEM. 2011 Jul; 96(7):1911-30. Performed At: [facility] [place], [place] [id number] Rush Farmer MD BO:[id number] Performed at [facility], [place] [address]  [place], [place] [address]   . Folate 01/13/2018 13.7  >5.9 ng/mL Final   Performed at [facility], [place] [address] [place], [place] [address]  . Vitamin B-12 01/13/2018 263  180 - 914 pg/mL Final   Comment: (NOTE) This assay is not validated for testing neonatal or myeloproliferative syndrome specimens for Vitamin B12 levels. Performed at [facility], [place] [address] [place], [place] [address]          Ardath Sax, MD

## 2018-01-19 NOTE — Assessment & Plan Note (Signed)
47 y.o. menorrhagia female with previous history of iron deficiency anemia due to with uterine polyp as well as a mass in the right ovary detected about 2 years ago.  Patient elected not to have it further evaluated or addressed so far.  She also does not show good adherence to the recommended iron replacement therapy based on her verbal recall.  Current presentation with progressive normocytic normochromic anemia with concurrent thrombocytosis of mild severity is consistent with blood loss anemia development and reactive thrombocytosis.  Patient has no symptoms to suggest myeloproliferative neoplasm at this time.  Assessment will be focused on addressing the iron depletion as well as returning patient to the proper follow-up for her ovarian mass.  Plan: - Repeat lab work today as outlined below. -Return to clinic in 1 week for discussion of the results and potential iron infusion. -I have offered gynecological consultation request, but patient prefers to contact gynecology directly on her own.

## 2018-01-22 ENCOUNTER — Inpatient Hospital Stay (HOSPITAL_BASED_OUTPATIENT_CLINIC_OR_DEPARTMENT_OTHER): Payer: No Typology Code available for payment source | Admitting: Hematology and Oncology

## 2018-01-22 ENCOUNTER — Encounter: Payer: Self-pay | Admitting: Hematology and Oncology

## 2018-01-22 ENCOUNTER — Telehealth: Payer: Self-pay | Admitting: Hematology and Oncology

## 2018-01-22 VITALS — BP 126/91 | HR 70 | Temp 97.8°F | Resp 18 | Ht 62.0 in | Wt 173.0 lb

## 2018-01-22 DIAGNOSIS — D509 Iron deficiency anemia, unspecified: Secondary | ICD-10-CM

## 2018-01-22 DIAGNOSIS — D508 Other iron deficiency anemias: Secondary | ICD-10-CM

## 2018-01-22 NOTE — Telephone Encounter (Signed)
Appointments scheduled AVS/Calendar printed per 5/23 los °

## 2018-01-23 ENCOUNTER — Inpatient Hospital Stay: Payer: No Typology Code available for payment source

## 2018-01-23 ENCOUNTER — Telehealth: Payer: Self-pay | Admitting: Hematology and Oncology

## 2018-01-23 VITALS — BP 125/84 | HR 73 | Temp 98.1°F | Resp 18

## 2018-01-23 DIAGNOSIS — D508 Other iron deficiency anemias: Secondary | ICD-10-CM

## 2018-01-23 DIAGNOSIS — D509 Iron deficiency anemia, unspecified: Secondary | ICD-10-CM | POA: Diagnosis not present

## 2018-01-23 MED ORDER — SODIUM CHLORIDE 0.9 % IV SOLN
510.0000 mg | Freq: Once | INTRAVENOUS | Status: AC
  Administered 2018-01-23: 510 mg via INTRAVENOUS
  Filled 2018-01-23: qty 17

## 2018-01-23 MED ORDER — SODIUM CHLORIDE 0.9 % IV SOLN
Freq: Once | INTRAVENOUS | Status: AC
  Administered 2018-01-23: 14:00:00 via INTRAVENOUS

## 2018-01-23 NOTE — Telephone Encounter (Signed)
Called patient to reschedule her Feraheme infusion from 5/24 to 6/3 per her request

## 2018-01-23 NOTE — Patient Instructions (Signed)

## 2018-01-28 ENCOUNTER — Other Ambulatory Visit: Payer: Self-pay

## 2018-02-01 NOTE — Progress Notes (Signed)
[name] Follow-up Visit:  Assessment: Iron deficiency anemia 47 y.o. menorrhagia female with previous history of iron deficiency anemia due to with uterine polyp as well as a mass in the right ovary detected about 2 years ago.  Patient elected not to have it further evaluated or addressed so far.  She also does not show good adherence to the recommended iron replacement therapy based on her verbal recall.  Repeat lab evaluation demonstrates persistent microcytic hypochromic anemia with iron deficiency.  Patient with previous poor adherence to oral therapy, I believe parenteral administration of iron supplementation is indicated at this time.  Patient has scheduled follow-up with her gynecology provider to address previously noted and oncological abnormalities.  Plan: - Feraheme 510 mg IV x2 -Return to clinic in 2 months with labs obtained 2 to 3 days prior for hematological monitoring and possible additional IV iron supplementation to keep ferritin over 50    Voice recognition software was used and creation of this note. Despite my best effort at editing the text, some misspelling/errors may have occurred.  Orders Placed This Encounter  Procedures  . CBC with Differential ([facility] Only)    Standing Status:   Future    Standing Expiration Date:   01/23/2019  . CMP ([facility] only)    Standing Status:   Future    Standing Expiration Date:   01/23/2019  . Iron and TIBC    Standing Status:   Future    Standing Expiration Date:   01/23/2019  . Ferritin    Standing Status:   Future    Standing Expiration Date:   01/23/2019    [facility] Staging No matching staging information was found for the patient.  All questions were answered.  . The patient knows to call the clinic with any problems, questions or concerns.  This note was electronically signed.    History of Presenting Illness [name] is a 47 y.o. female followed in the [facility] for  elevated platelets and iron deficiency anemia, referred by Dr Nino Glow McLean-Scocuzza.  Patient's past medical history is significant for anxiety, low-grade cervical dysplasia, and GERD.  Of note, patient was also found to have a right ovarian mass and endometrial polyp in 2017 and was supposed to follow-up with gynecology but never addressed those issues despite repeated recommendations from the primary care providers.  Past family history significant for mother with lung Kent related to tobacco smoking, maternal uncle with esophageal Kent, and maternal grandfather with brain tumor.  Patient is a former smoker but quit in 1992.  She does not drink alcohol.  Patient complains of fatigue, low energy levels, hair loss and scalp scabs, but denies any fevers, chills, night sweats.  Denies unexpected weight loss or weight gain.  Stable energy level.  No nausea, vomiting, abdominal pain, diarrhea, constipation, melena, or hematochezia.  No dysuria or hematuria.  Patient continues to have heavy menstrual periods.  Denies unusual food cravings, lightheadedness, or dizziness.  Previously was instructed to take slow iron twice daily, but has not been doing that for a variety of reasons.  In the interim, patient has contacted her GYN provider and scheduled follow-up appointment as previously instructed.  Oncological/hematological History: --Labs, 12/03/08: Hgb 13.7, MCV 88.2, MCH     ..., MCHC 34.0, RDW 14.0, Plt 275; --Labs, 12/14/12: Hgb 12.0, MCV 83.3, MCH 27.9, MCHC 33.5, RDW 15.4, Plt 396;  --Labs, 12/06/15: Hgb 10.3, MCV 80.8, MCH 25.0, MCHC 30.9, RDW 11.3, Plt 422; --  Labs, 10/31/17: Hgb   9.6, MCV 86.1, MCH     ..., MCHC 33.1, RDW 16.1, Plt 477; Fe 18, FeSat 4%, TIBC 469, Ferritin 13; TSH 2.04 --Labs, 01/13/18: Hgb 10.0, MCV 79.8, MCH 25.3, MCHC 31.8, RDW 19.4, Plt 444; Fe 24, FeSat 5%, TIBC 454, Ferritin 17, Folate 13.7, VitB12 263, MMA 93, VitD3 20.6   No history exists.    Medical History: Past  Medical History:  Diagnosis Date  . Anxiety   . ASCUS favoring benign 12/2015   negative high risk HPV  . Chicken pox   . GERD (gastroesophageal reflux disease)    during pregnancy   . 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 Hyperlipidemia   . Hypertension    during pregnancy   . 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 Reflux     Surgical History: Past Surgical History:  Procedure Laterality Date  . COLPOSCOPY    . TONSILLECTOMY AND ADENOIDECTOMY      Family History: Family History  Problem Relation Age of Onset  . Hypertension Mother   . Kent Mother        LUNG [facility]/SMOKER  . Depression Mother   . Mental illness Mother   . Diabetes Father   . Hypertension Father   . Heart disease Father   . Hyperlipidemia Father   . Stroke Father   . Hypertension Brother   . Diabetes Brother   . Depression Brother   . Hyperlipidemia Brother   . Kent Maternal Uncle        ESOPHAGEAL CA  . Diabetes Cousin   . Heart disease Cousin   . Kent Maternal Grandfather        Brain  . Stroke Paternal Grandmother   . Depression Maternal Grandmother   . Mental retardation Maternal Grandmother   . Stroke Paternal Grandfather     Social History: Social History   Socioeconomic History  . Marital status: Divorced    Spouse name: Not on file  . Number of children: 2  . Years of education: Not on file  . Highest education level: Not on file  Occupational History  . Not on file  Social Needs  . Financial resource strain: Not on file  . Food insecurity:    Worry: Not on file    Inability: Not on file  . Transportation needs:    Medical: Not on file    Non-medical: Not on file  Tobacco Use  . Smoking status: Former Smoker    Types: Cigarettes    Last attempt to quit: 09/02/1990    Years since quitting: 27.4  . Smokeless tobacco: Never Used  . Tobacco comment: smoked socially in high school   Substance and Sexual Activity  . Alcohol use: Yes     Alcohol/week: 0.0 oz    Comment: occasionally   . Drug use: No  . Sexual activity: Not on file    Comment: 1st intercourse 47 yo-More than 5 partners  Lifestyle  . Physical activity:    Days per week: Not on file    Minutes per session: Not on file  . Stress: Not on file  Relationships  . Social connections:    Talks on phone: Not on file    Gets together: Not on file    Attends religious service: Not on file    Active member of club or organization: Not on file    Attends meetings of clubs or organizations:  Not on file    Relationship status: Not on file  . Intimate partner violence:    Fear of current or ex partner: Not on file    Emotionally abused: Not on file    Physically abused: Not on file    Forced sexual activity: Not on file  Other Topics Concern  . 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 Codeine     Breathing problems   . Erythromycin     GI upset   . Levofloxacin     Heart racing     Medications:  Current Outpatient Medications  Medication Sig Dispense Refill  . Cholecalciferol 50000 units capsule Take 1 capsule (50,000 Units total) by mouth once a week. 13 capsule 1  . Ferrous Sulfate (IRON SUPPLEMENT PO) Take by mouth.    Marland Kitchen ketoconazole (NIZORAL) 2 % shampoo Apply 1 application topically 2 (two) times a week. Let stand on scalp then rinse after 5 minuts 120 mL 1   No current facility-administered medications for this visit.     Review of Systems: Review of Systems  All other systems reviewed and are negative.    PHYSICAL EXAMINATION Blood pressure (!) 126/91, pulse 70, temperature 97.8 F (36.6 C), temperature source Oral, resp. rate 18, height _0  (1.575 m), weight 173 lb (78.5 kg), SpO2 100 %.  ECOG PERFORMANCE STATUS: 0 - Asymptomatic  Physical Exam  Constitutional:  She is oriented to person, place, and time. She appears well-developed and well-nourished. No distress.  HENT:  Head: Normocephalic and atraumatic.  Mouth/Throat: Oropharynx is clear and moist. No oropharyngeal exudate.  Eyes: Pupils are equal, round, and reactive to light. Conjunctivae and EOM are normal. No scleral icterus.  Neck: Normal range of motion. Neck supple. No thyromegaly present.  Cardiovascular: Normal rate, regular rhythm, normal heart sounds and intact distal pulses. Exam reveals no gallop and no friction rub.  No murmur heard. Pulmonary/Chest: Effort normal and breath sounds normal. No respiratory distress. She has no wheezes. She exhibits no tenderness.  Abdominal: Soft. Bowel sounds are normal. She exhibits no distension and no mass. There is no tenderness. There is no guarding.  Musculoskeletal: She exhibits no edema.  Lymphadenopathy:    She has no cervical adenopathy.  Neurological: She is alert and oriented to person, place, and time. She displays normal reflexes. No cranial nerve deficit or sensory deficit.  Skin: Skin is warm and dry. No rash noted. She is not diaphoretic. No erythema. There is pallor.     LABORATORY DATA: I have personally reviewed the data as listed: No visits with results within 1 Week(s) from this visit.  Latest known visit with results is:  Appointment on 01/13/2018  Component Date Value Ref Range Status  . WBC Count 01/13/2018 4.6  3.9 - 10.3 K/uL Final  . RBC 01/13/2018 3.96  3.70 - 5.45 MIL/uL Final  . Hemoglobin 01/13/2018 10.0* 11.6 - 15.9 g/dL Final  . HCT 01/13/2018 31.6* 34.8 - 46.6 % Final  . MCV 01/13/2018 79.8  79.5 - 101.0 fL Final  . MCH 01/13/2018 25.3  25.1 - 34.0 pg Final  . MCHC 01/13/2018 31.8  31.5 - 36.0 g/dL Final  . RDW 01/13/2018 19.4* 11.2 - 14.5 % Final  . Platelet  Count 01/13/2018 444* 145 - 400 K/uL Final  . Neutrophils Relative % 01/13/2018 64  % Final  . Neutro Abs 01/13/2018 3.0  1.5 - 6.5 K/uL Final  .  Lymphocytes Relative 01/13/2018 28  % Final  . Lymphs Abs 01/13/2018 1.3  0.9 - 3.3 K/uL Final  . Monocytes Relative 01/13/2018 6  % Final  . Monocytes Absolute 01/13/2018 0.3  0.1 - 0.9 K/uL Final  . Eosinophils Relative 01/13/2018 1  % Final  . Eosinophils Absolute 01/13/2018 0.0  0.0 - 0.5 K/uL Final  . Basophils Relative 01/13/2018 1  % Final  . Basophils Absolute 01/13/2018 0.0  0.0 - 0.1 K/uL Final   Performed at [facility], [place] [address], [place], [place] [address]  . Methylmalonic Acid, Quantitative 01/13/2018 93  0 - 378 nmol/L Final  . Disclaimer: 01/13/2018 Comment   Final   Comment: (NOTE) This test was developed and its performance characteristics determined by [facility]. It has not been cleared or approved by the Food and Drug Administration. Performed At: [facility] [place], [place] [id number] Rush Farmer MD [id number] Performed at [facility], [place] [address], [place], [place] [address]   . Sodium 01/13/2018 139  136 - 145 mmol/L Final  . Potassium 01/13/2018 3.9  3.5 - 5.1 mmol/L Final  . Chloride 01/13/2018 105  98 - 109 mmol/L Final  . CO2 01/13/2018 26  22 - 29 mmol/L Final  . Glucose, Bld 01/13/2018 94  70 - 140 mg/dL Final  . BUN 01/13/2018 14  7 - 26 mg/dL Final  . Creatinine 01/13/2018 0.78  0.60 - 1.10 mg/dL Final  . Calcium 01/13/2018 9.7  8.4 - 10.4 mg/dL Final  . Total Protein 01/13/2018 8.2  6.4 - 8.3 g/dL Final  . Albumin 01/13/2018 3.9  3.5 - 5.0 g/dL Final  . AST 01/13/2018 19  5 - 34 U/L Final  . ALT 01/13/2018 15  0 - 55 U/L Final  . Alkaline Phosphatase 01/13/2018 116  40 - 150 U/L Final  . Total Bilirubin 01/13/2018 0.3  0.2 - 1.2 mg/dL Final  . GFR, Est Non Af Am 01/13/2018 >60  >60 mL/min Final  . GFR, Est AFR Am 01/13/2018 >60  >60 mL/min Final   Comment: (NOTE) The eGFR has been calculated using the CKD EPI equation. This calculation has not been validated  in all clinical situations. eGFR's persistently <60 mL/min signify possible Chronic Kidney Disease.   Georgiann Hahn gap 01/13/2018 8  3 - 11 Final   Performed at [facility], [place] [address] [place], [place] [address]  . Iron 01/13/2018 24* 41 - 142 ug/dL Final  . TIBC 01/13/2018 454* 236 - 444 ug/dL Final  . Saturation Ratios 01/13/2018 5* 21 - 57 % Final  . UIBC 01/13/2018 429  ug/dL Final   Performed at [facility], [place] [address] [place], [place] [address]  . Ferritin 01/13/2018 17  9 - 269 ng/mL Final   Performed at [facility], [place] [address] [place], [place] [address]  . Vit D, 25-Hydroxy 01/13/2018 20.6* 30.0 - 100.0 ng/mL Final   Comment: (NOTE) Vitamin D deficiency has been defined by the [place] practice guideline as a level of serum 25-OH vitamin D less than 20 ng/mL (1,2). The Endocrine Society went on to further define vitamin D insufficiency as a level between 21 and  29 ng/mL (2). 1. IOM (Institute of Medicine). 2010. Dietary reference   intakes for calcium and D. [place]: The   [employer]. 2. Holick MF, Binkley Slaton, Bischoff-Ferrari HA, et al.   Evaluation, treatment, and prevention of vitamin D   deficiency: an Endocrine Society clinical practice   guideline. JCEM. 2011 Jul; 96(7):1911-30. Performed At: [facility] [place], [place] [id number] Rush Farmer MD [id number] Performed at [facility], [place] [address] [place], [place] [address]   . Folate 01/13/2018 13.7  >5.9 ng/mL Final   Performed at [facility], [place] [address] [place], [place] [address]  . Vitamin B-12 01/13/2018 263  180 - 914 pg/mL Final   Comment: (NOTE) This assay is not validated for testing neonatal or myeloproliferative syndrome specimens for Vitamin B12 levels. Performed at [facility],  [place] [address] [place], [place] [address]        Ardath Sax, MD

## 2018-02-01 NOTE — Assessment & Plan Note (Signed)
47 y.o. menorrhagia female with previous history of iron deficiency anemia due to with uterine polyp as well as a mass in the right ovary detected about 2 years ago.  Patient elected not to have it further evaluated or addressed so far.  She also does not show good adherence to the recommended iron replacement therapy based on her verbal recall.  Repeat lab evaluation demonstrates persistent microcytic hypochromic anemia with iron deficiency.  Patient with previous poor adherence to oral therapy, I believe parenteral administration of iron supplementation is indicated at this time.  Patient has scheduled follow-up with her gynecology provider to address previously noted and oncological abnormalities.  Plan: - Feraheme 510 mg IV x2 -Return to clinic in 2 months with labs obtained 2 to 3 days prior for hematological monitoring and possible additional IV iron supplementation to keep ferritin over 50

## 2018-02-02 ENCOUNTER — Ambulatory Visit (HOSPITAL_COMMUNITY): Admission: RE | Admit: 2018-02-02 | Payer: No Typology Code available for payment source | Source: Ambulatory Visit

## 2018-02-03 ENCOUNTER — Ambulatory Visit
Admission: RE | Admit: 2018-02-03 | Discharge: 2018-02-03 | Disposition: A | Discharge status: Home / Self Care | Payer: No Typology Code available for payment source | Source: Ambulatory Visit | Attending: Internal Medicine | Admitting: Internal Medicine

## 2018-02-03 ENCOUNTER — Ambulatory Visit: Payer: Self-pay

## 2018-02-03 DIAGNOSIS — N644 Mastodynia: Secondary | ICD-10-CM

## 2018-02-12 ENCOUNTER — Encounter: Payer: Self-pay | Admitting: Gynecology

## 2018-02-12 ENCOUNTER — Ambulatory Visit (INDEPENDENT_AMBULATORY_CARE_PROVIDER_SITE_OTHER): Payer: No Typology Code available for payment source | Admitting: Gynecology

## 2018-02-12 VITALS — BP 124/80 | Ht 62.5 in | Wt 172.0 lb

## 2018-02-12 DIAGNOSIS — Z01419 Encounter for gynecological examination (general) (routine) without abnormal findings: Secondary | ICD-10-CM | POA: Diagnosis not present

## 2018-02-12 DIAGNOSIS — N924 Excessive bleeding in the premenopausal period: Secondary | ICD-10-CM | POA: Diagnosis not present

## 2018-02-12 DIAGNOSIS — D500 Iron deficiency anemia secondary to blood loss (chronic): Secondary | ICD-10-CM | POA: Diagnosis not present

## 2018-02-12 NOTE — Addendum Note (Signed)
Addended by: Nelva Nay on: 02/12/2018 04:58 PM   Modules accepted: Orders

## 2018-02-12 NOTE — Patient Instructions (Signed)
Follow-up for the ultrasound as scheduled. 

## 2018-02-12 NOTE — Progress Notes (Signed)
    Ninamarie Keel 09-15-1970 [id number]        47 y.o.  [id number] for annual gynecologic exam.  Has not been seen since 2017.  History of heavy menses with some irregular bleeding with ultrasound suggesting endometrial polyp and a solid right ovarian mass mean measurement 37 mm with peripheral flow.  Was planning on proceeding with LAVH right mass removal/RSO but ultimately never followed up for this.  She is been recently seen for anemia with hemoglobin in the 9 range with low iron levels and underwent a iron infusion.  Past medical history,surgical history, problem list, medications, allergies, family history and social history were all reviewed and documented as reviewed in the EPIC chart.  ROS:  Performed with pertinent positives and negatives included in the history, assessment and plan.   Additional significant findings : None   Exam: [name] assistant Vitals:   02/12/18 1616  BP: 124/80  Weight: 172 lb (78 kg)  Height: 5' 2.5" (1.588 m)   Body mass index is 30.96 kg/m.  General appearance:  Normal affect, orientation and appearance. Skin: Grossly normal HEENT: Without gross lesions.  No cervical or supraclavicular adenopathy. Thyroid normal.  Lungs:  Clear without wheezing, rales or rhonchi Cardiac: RR, without RMG Abdominal:  Soft, nontender, without masses, guarding, rebound, organomegaly or hernia Breasts:  Examined lying and sitting without masses, retractions, discharge or axillary adenopathy. Pelvic:  Ext, BUS, Vagina: Normal  Cervix: Normal  Uterus: Anteverted, normal size, shape and contour, midline and mobile nontender   Adnexa: Without masses or tenderness    Anus and perineum: Normal   Rectovaginal: Normal sphincter tone without palpated masses or tenderness.    Assessment/Plan:  47 y.o. [id number] female for annual gynecologic exam with heavy menses, vasectomy birth control.   1. Heavy menses/solid right ovarian mass on prior ultrasound 2 years ago.   Ultrasound suggests endometrial polyp also.  Iron deficient anemia with recent iron infusion.  Recommend starting with sonohysterogram relook at the right ovary as well as the endometrium.  I again discussed options to include hysterectomy with right oophorectomy or mass removal if it is still present.  More conservative options to include hysteroscopy removal of any defects/possible ablation and addressing the ovarian mass laparoscopically also discussed.  Patient will start with the sonohysterogram and follow-up with me afterwards. 2. Pap smear 2017 showed ASCUS negative high risk HPV.  She was to repeat it in a year.  Pap smear done today.  History of Pap smear/HPV 2014 negative.  Did have persistent LGSIL from 2004 through 2006 but then negative Pap smears afterwards. 3. Mammography 01/2018.  Continue with annual mammography when due.  Breast exam normal today. 4. Health maintenance.  No routine lab work done as patient does this elsewhere now.  Follow-up for sonohysterogram as scheduled.   [name] MD, 4:49 PM 02/12/2018

## 2018-02-16 LAB — PAP IG W/ RFLX HPV ASCU

## 2018-03-09 ENCOUNTER — Ambulatory Visit
Admission: RE | Admit: 2018-03-09 | Discharge: 2018-03-09 | Disposition: A | Discharge status: Home / Self Care | Payer: No Typology Code available for payment source | Source: Ambulatory Visit | Attending: Internal Medicine | Admitting: Internal Medicine

## 2018-03-09 DIAGNOSIS — E2839 Other primary ovarian failure: Secondary | ICD-10-CM

## 2018-03-20 ENCOUNTER — Other Ambulatory Visit: Payer: Self-pay

## 2018-03-24 ENCOUNTER — Inpatient Hospital Stay
Payer: No Typology Code available for payment source | Attending: Hematology and Oncology | Admitting: Nurse Practitioner

## 2018-03-24 ENCOUNTER — Telehealth: Payer: Self-pay

## 2018-03-24 NOTE — Progress Notes (Deleted)
[place]  Telephone:(336) [phone] Fax:(336) [phone]  Clinic Follow up Note   Patient Care Team: McLean-Scocuzza, Nino Glow, MD as PCP - General (Internal Medicine) 03/24/2018  DIAGNOSIS: Iron deficiency anemia  INTERVAL HISTORY: Please see below for problem oriented charting.  REVIEW OF SYSTEMS:   Constitutional: Denies fevers, chills or abnormal weight loss Eyes: Denies blurriness of vision Ears, nose, mouth, throat, and face: Denies mucositis or sore throat Respiratory: Denies cough, dyspnea or wheezes Cardiovascular: Denies palpitation, chest discomfort or lower extremity swelling Gastrointestinal:  Denies nausea, heartburn or change in bowel habits Skin: Denies abnormal skin rashes Lymphatics: Denies new lymphadenopathy or easy bruising Neurological:Denies numbness, tingling or new weaknesses Behavioral/Psych: Mood is stable, no new changes  All other systems were reviewed with the patient and are negative.  MEDICAL HISTORY:  Past Medical History:  Diagnosis Date  . Anemia   . Anxiety   . ASCUS favoring benign 12/2015   negative high risk HPV  . Chicken pox   . GERD (gastroesophageal reflux disease)    during pregnancy   . 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 Hyperlipidemia   . Hypertension    during pregnancy   . 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 Reflux     SURGICAL HISTORY: Past Surgical History:  Procedure Laterality Date  . COLPOSCOPY    . TONSILLECTOMY AND ADENOIDECTOMY      I have reviewed the social history and family history with the patient and they are unchanged from previous note.  ALLERGIES:  is allergic to codeine; erythromycin; and levofloxacin.  MEDICATIONS:  Current Outpatient Medications  Medication Sig Dispense Refill  . Calcium Carb-Cholecalciferol (CALCIUM 1000 + D PO) Take by mouth.    . Cholecalciferol 50000 units capsule Take 1 capsule (50,000 Units total) by mouth once a  week. 13 capsule 1  . Ferrous Sulfate (IRON SUPPLEMENT PO) Take by mouth.    Marland Kitchen ketoconazole (NIZORAL) 2 % shampoo Apply 1 application topically 2 (two) times a week. Let stand on scalp then rinse after 5 minuts (Patient not taking: Reported on 02/12/2018) 120 mL 1   No current facility-administered medications for this visit.     PHYSICAL EXAMINATION: ECOG PERFORMANCE STATUS: {CHL ONC ECOG [phone]}  There were no vitals filed for this visit. There were no vitals filed for this visit.  GENERAL:alert, no distress and comfortable SKIN: skin color, texture, turgor are normal, no rashes or significant lesions EYES: normal, Conjunctiva are pink and non-injected, sclera clear OROPHARYNX:no exudate, no erythema and lips, buccal mucosa, and tongue normal  NECK: supple, thyroid normal size, non-tender, without nodularity LYMPH:  no palpable lymphadenopathy in the cervical, axillary or inguinal LUNGS: clear to auscultation and percussion with normal breathing effort HEART: regular rate & rhythm and no murmurs and no lower extremity edema ABDOMEN:abdomen soft, non-tender and normal bowel sounds Musculoskeletal:no cyanosis of digits and no clubbing  NEURO: alert & oriented x 3 with fluent speech, no focal motor/sensory deficits  LABORATORY DATA:  I have reviewed the data as listed CBC Latest Ref Rng & Units 01/13/2018 10/31/2017 12/06/2015  WBC 3.9 - 10.3 K/uL 4.6 4.7 4.6  Hemoglobin 11.6 - 15.9 g/dL 10.0(L) 9.6(L) 10.3(L)  Hematocrit 34.8 - 46.6 % 31.6(L) 28.9(L) 33.3(L)  Platelets 145 - 400 K/uL 444(H) 477.0(H) 422(H)     CMP Latest Ref Rng & Units 01/13/2018 10/31/2017 12/06/2015  Glucose 70 - 140 mg/dL 94 92 84  BUN  7 - 26 mg/dL 14 14 11   Creatinine 0.60 - 1.10 mg/dL 0.78 0.59 0.57  Sodium 136 - 145 mmol/L 139 136 135  Potassium 3.5 - 5.1 mmol/L 3.9 3.6 4.6  Chloride 98 - 109 mmol/L 105 104 100  CO2 22 - 29 mmol/L 26 26 27   Calcium 8.4 - 10.4 mg/dL 9.7 9.0 9.0  Total Protein 6.4 - 8.3  g/dL 8.2 7.7 7.2  Total Bilirubin 0.2 - 1.2 mg/dL 0.3 0.5 0.6  Alkaline Phos 40 - 150 U/L 116 89 89  AST 5 - 34 U/L 19 16 17   ALT 0 - 55 U/L 15 13 11       RADIOGRAPHIC STUDIES: I have personally reviewed the radiological images as listed and agreed with the findings in the report. No results found.   ASSESSMENT & PLAN:  No problem-specific Assessment & Plan notes found for this encounter.   No orders of the defined types were placed in this encounter.  All questions were answered. The patient knows to call the clinic with any problems, questions or concerns. No barriers to learning was detected. I spent {CHL ONC TIME VISIT - FIEPP:[id number]} counseling the patient face to face. The total time spent in the appointment was {CHL ONC TIME VISIT - AYTKZ:[id number]} and more than 50% was on counseling and review of test results     Alla Feeling, NP 03/24/18

## 2018-03-24 NOTE — Telephone Encounter (Signed)
Patient had attempted to cancel today's appointment all day yesterday without success.  She had to cancel iron infusion for tomorrow.  Patient to call back on Monday and speak with scheduling when she is able to know what days she can come.

## 2018-03-25 ENCOUNTER — Ambulatory Visit: Payer: Self-pay

## 2018-04-01 ENCOUNTER — Telehealth: Payer: Self-pay | Admitting: Hematology

## 2018-04-01 ENCOUNTER — Telehealth: Payer: Self-pay

## 2018-04-01 NOTE — Telephone Encounter (Signed)
Spoke with patient regarding rescheduling her appointments and she requested to split up appointments/ per 7/23 sch msg

## 2018-04-01 NOTE — Telephone Encounter (Signed)
Patient calls stating she will have lab work on Friday morning, requesting we run a UA concerned about infection, wanted kidney function checked, I explained to her that our routine labs check for this. Also concerned about her blood glucose, explained she can come in fasting and that is part of our routine orders as well.  Patient verbalized an understanding.

## 2018-04-02 ENCOUNTER — Other Ambulatory Visit: Payer: Self-pay | Admitting: Gynecology

## 2018-04-02 ENCOUNTER — Telehealth: Payer: Self-pay

## 2018-04-02 DIAGNOSIS — N939 Abnormal uterine and vaginal bleeding, unspecified: Secondary | ICD-10-CM

## 2018-04-02 NOTE — Telephone Encounter (Signed)
Copied from [place] [id number]. Topic: Referral - Request >> Apr 02, 2018 12:48 PM Bea Graff, NT wrote: Reason for CRM: Pt is needing a new referral to her orthopedic, dermatologist, gynecologist, hematologist for her [employer]. She has appointments coming up next week with most of these drs and she needs a referral to them so her insurance will cover. She states her new hematologist will be Dr. Bella Kennedy.

## 2018-04-03 ENCOUNTER — Inpatient Hospital Stay: Payer: No Typology Code available for payment source | Attending: Hematology and Oncology

## 2018-04-03 ENCOUNTER — Other Ambulatory Visit: Payer: Self-pay | Admitting: *Deleted

## 2018-04-03 ENCOUNTER — Other Ambulatory Visit: Payer: Self-pay | Admitting: Nurse Practitioner

## 2018-04-03 DIAGNOSIS — N840 Polyp of corpus uteri: Secondary | ICD-10-CM | POA: Insufficient documentation

## 2018-04-03 DIAGNOSIS — D508 Other iron deficiency anemias: Secondary | ICD-10-CM | POA: Diagnosis present

## 2018-04-03 DIAGNOSIS — N920 Excessive and frequent menstruation with regular cycle: Secondary | ICD-10-CM | POA: Diagnosis present

## 2018-04-03 DIAGNOSIS — E559 Vitamin D deficiency, unspecified: Secondary | ICD-10-CM | POA: Diagnosis not present

## 2018-04-03 DIAGNOSIS — R8290 Unspecified abnormal findings in urine: Secondary | ICD-10-CM

## 2018-04-03 DIAGNOSIS — R109 Unspecified abdominal pain: Secondary | ICD-10-CM | POA: Diagnosis not present

## 2018-04-03 LAB — CMP (CANCER CENTER ONLY)
ALBUMIN: 3.7 g/dL (ref 3.5–5.0)
ALK PHOS: 94 U/L (ref 38–126)
ALT: 12 U/L (ref 0–44)
ANION GAP: 10 (ref 5–15)
AST: 15 U/L (ref 15–41)
BUN: 10 mg/dL (ref 6–20)
CALCIUM: 8.9 mg/dL (ref 8.9–10.3)
CO2: 23 mmol/L (ref 22–32)
Chloride: 102 mmol/L (ref 98–111)
Creatinine: 0.76 mg/dL (ref 0.44–1.00)
GFR, Estimated: >60 mL/min (ref >60)
GLUCOSE: 97 mg/dL (ref 70–99)
POTASSIUM: 4.4 mmol/L (ref 3.5–5.1)
SODIUM: 135 mmol/L (ref 135–145)
Total Bilirubin: 0.5 mg/dL (ref 0.3–1.2)
Total Protein: 7.5 g/dL (ref 6.5–8.1)

## 2018-04-03 LAB — IRON AND TIBC
IRON: 73 ug/dL (ref 41–142)
Saturation Ratios: 19 % — ABNORMAL LOW (ref 21–57)
TIBC: 376 ug/dL (ref 236–444)
UIBC: 303 ug/dL

## 2018-04-03 LAB — CBC WITH DIFFERENTIAL (CANCER CENTER ONLY)
BASOS ABS: 0 10*3/uL (ref 0.0–0.1)
BASOS PCT: 1 %
Eosinophils Absolute: 0 10*3/uL (ref 0.0–0.5)
Eosinophils Relative: 1 %
HEMATOCRIT: 37.6 % (ref 34.8–46.6)
HEMOGLOBIN: 12.4 g/dL (ref 11.6–15.9)
LYMPHS PCT: 22 %
Lymphs Abs: 1.3 10*3/uL (ref 0.9–3.3)
MCH: 29.3 pg (ref 25.1–34.0)
MCHC: 33.1 g/dL (ref 31.5–36.0)
MCV: 88.7 fL (ref 79.5–101.0)
MONO ABS: 0.4 10*3/uL (ref 0.1–0.9)
MONOS PCT: 7 %
NEUTROS ABS: 4 10*3/uL (ref 1.5–6.5)
NEUTROS PCT: 69 %
Platelet Count: 327 10*3/uL (ref 145–400)
RBC: 4.23 MIL/uL (ref 3.70–5.45)
RDW: 19.8 % — ABNORMAL HIGH (ref 11.2–14.5)
WBC Count: 5.7 10*3/uL (ref 3.9–10.3)

## 2018-04-03 LAB — URINALYSIS, COMPLETE (UACMP) WITH MICROSCOPIC
BILIRUBIN URINE: NEGATIVE
Glucose, UA: NEGATIVE mg/dL
HGB URINE DIPSTICK: NEGATIVE
KETONES UR: NEGATIVE mg/dL
LEUKOCYTES UA: NEGATIVE
NITRITE: NEGATIVE
PROTEIN: NEGATIVE mg/dL
Specific Gravity, Urine: 1.004 — ABNORMAL LOW (ref 1.005–1.030)
pH: 6 (ref 5.0–8.0)

## 2018-04-03 LAB — FERRITIN: FERRITIN: 23 ng/mL (ref 11–307)

## 2018-04-04 LAB — URINE CULTURE: CULTURE: NO GROWTH

## 2018-04-05 NOTE — Progress Notes (Addendum)
[name]  [phone] [phone]  Clinic Follow up Note   Patient Care Team: [name], Renee Glow, MD as PCP - General (Internal Medicine) 04/06/2018  DIAGNOSIS: iron deficiency anemia due to chronic blood loss   CURRENT THERAPY: IV Feraheme x1 01/23/18   INTERVAL HISTORY: [name] was previously seen by Dr. Lebron Kent in 12/2017 for iron deficiency anemia in the setting of menorrhagia; hgb 10.0 at that time. Folate, B12 and MMA were normal. Iron studies revealed low serum iron and saturation ratio and elevated TIBC; ferritin was 17, consistent with iron deficiency.  Previous GYN work up in 2017 revealed uterine polyp and right ovarian mass. It was recommended she undergo mass removal/RSO but did not follow up. At that time she had a normocytic, hypochromic anemia with Hgb 10.3 with thrombocytosis. She has been nonadherent to oral iron regimen; she has busy work schedule and felt dietary guidelines for oral iron intake were restricting. She tolerates slow-Fe better than ferrous sulfate, as it causes less nausea and constipation. She received 1 dose IV Feraheme per Dr. Lebron Kent on 01/23/18. Repeat iron studies on 04/03/18 indicated improvement in iron studies, ferritin 23, serum iron 73, normal TIBC, and 19% saturation. Her anemia and thrombocytosis resolved. Since last visit she has been trying to increase oral iron intake, takes 0-2 per day.   She reports menorrhagia for "years." LMP 02/11/18, lasting 5-6 day every month. She has heavy flow with clots for 2-4 days, requiring product change q2 hours. She has US/SHG coming up 8/6 per Dr. Phineas Kent to monitor uterine polyp and ovarian mass. She will then f/u with Gyn for recommendations pending imaging results. Denies any other sources of bleeding.  Today she feels well without much difference after receiving IV Feraheme. She tolerated the infusion without difficulty. Few days after her infusion she noticed increased leg  weakness and felt her heart was racing. She has occasional tightness in her chest not associated with cough, dyspnea, or activity. She can't tell if it's related to meals or acid reflux. Unclear if anxiety component. None currently. She does note stress with her job. 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 She drives 220 miles per day and frequently loads/unloads her car. She developed intermittent right flank pain with cloudy urine over the last week. No fever or chills.   REVIEW OF SYSTEMS:   Constitutional: Denies fevers, chills or abnormal weight loss (+) energy level at baseline  Ears, nose, mouth, throat, and face: Denies mucositis or sore throat Respiratory: Denies cough, dyspnea or wheezes Cardiovascular: Denies lower extremity swelling (+) palpitation after IV Feraheme (+) intermittent chest tightness after Feraheme  Gastrointestinal:  Denies nausea, vomiting, constipation, diarrhea, hematochezia, heartburn or change in bowel habits GYN: (+) heavy monthly menses lasting 5-6 days (+) intermittent right flank pain   Skin: Denies abnormal skin rashes Lymphatics: Denies new lymphadenopathy or easy bruising Neurological: Denies headaches (+) leg weakness after Feraheme   Behavioral/Psych: Mood is stable, no new changes  All other systems were reviewed with the patient and are negative.  MEDICAL HISTORY:  Past Medical History:  Diagnosis Date  . Anemia   . Anxiety   . ASCUS favoring benign 12/2015   negative high risk HPV  . Chicken pox   . GERD (gastroesophageal reflux disease)    during pregnancy   . 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 Hyperlipidemia   . Hypertension    during pregnancy   . LGSIL (low  grade squamous intraepithelial dysplasia)    2004-2006  . Reflux     SURGICAL HISTORY: Past Surgical History:  Procedure Laterality Date  . COLPOSCOPY    . TONSILLECTOMY AND ADENOIDECTOMY      I have reviewed the social history and family history with the patient and  they are unchanged from previous note.  ALLERGIES:  is allergic to codeine; erythromycin; and levofloxacin.  MEDICATIONS:  Current Outpatient Medications  Medication Sig Dispense Refill  . Calcium Carb-Cholecalciferol (CALCIUM 1000 + D PO) Take by mouth.    . Cholecalciferol 50000 units capsule Take 1 capsule (50,000 Units total) by mouth once a week. 13 capsule 1  . Ferrous Sulfate (IRON SUPPLEMENT PO) Take by mouth.    Marland Kitchen ketoconazole (NIZORAL) 2 % shampoo Apply 1 application topically 2 (two) times a week. Let stand on scalp then rinse after 5 minuts 120 mL 1   No current facility-administered medications for this visit.     PHYSICAL EXAMINATION: ECOG PERFORMANCE STATUS: 1 - Symptomatic but completely ambulatory  Vitals:   04/06/18 0916  BP: 137/87  Pulse: 71  Resp: 17  Temp: 98.5 F (36.9 C)  SpO2: 98%   Filed Weights   04/06/18 0916  Weight: 168 lb 9.6 oz (76.5 kg)    GENERAL:alert, no distress and comfortable SKIN: no rashes or significant lesions EYES:  sclera clear OROPHARYNX:no thrush or ulcers   LYMPH:  no palpable cervical or supraclavicular lymphadenopathy  LUNGS: clear to auscultation with normal breathing effort HEART: regular rate & rhythm and no murmurs and no lower extremity edema ABDOMEN:abdomen soft, non-tender and normal bowel sounds. No CVAT  Musculoskeletal: no cyanosis of digits and no clubbing  NEURO: alert & oriented x 3 with fluent speech, no focal motor/sensory deficits  LABORATORY DATA:  I have reviewed the data as listed CBC Latest Ref Rng & Units 04/03/2018 01/13/2018 10/31/2017  WBC 3.9 - 10.3 K/uL 5.7 4.6 4.7  Hemoglobin 11.6 - 15.9 g/dL 12.4 10.0(L) 9.6(L)  Hematocrit 34.8 - 46.6 % 37.6 31.6(L) 28.9(L)  Platelets 145 - 400 K/uL 327 444(H) 477.0(H)     CMP Latest Ref Rng & Units 04/03/2018 01/13/2018 10/31/2017  Glucose 70 - 99 mg/dL 97 94 92  BUN 6 - 20 mg/dL 10 14 14   Creatinine 0.44 - 1.00 mg/dL 0.76 0.78 0.59  Sodium 135 - 145 mmol/L  135 139 136  Potassium 3.5 - 5.1 mmol/L 4.4 3.9 3.6  Chloride 98 - 111 mmol/L 102 105 104  CO2 22 - 32 mmol/L 23 26 26   Calcium 8.9 - 10.3 mg/dL 8.9 9.7 9.0  Total Protein 6.5 - 8.1 g/dL 7.5 8.2 7.7  Total Bilirubin 0.3 - 1.2 mg/dL 0.5 0.3 0.5  Alkaline Phos 38 - 126 U/L 94 116 89  AST 15 - 41 U/L 15 19 16   ALT 0 - 44 U/L 12 15 13       RADIOGRAPHIC STUDIES: I have personally reviewed the radiological images as listed and agreed with the findings in the report. No results found.   ASSESSMENT & PLAN: Ms. [name] is a pleasant 47 year old white female   1. Iron deficiency anemia secondary to chronic blood loss  -We reviewed her medical record in detail with the patient. Her anemia is consistent with iron deficiency secondary to chronic blood loss related to menorrhagia and uterine polyp. She is s/p IV Feraheme x1 in 12/2017. Her iron studies improved, anemia and thrombocytosis resolved. No need for additional anemia work up  at this time.  -Given her iron studies improved but remain in low-normal range, and she continues to have heavy monthly menses, she will proceed with another dose of IV Feraheme today.  -She had some symptoms of leg weakness, palpitation, and intermittent chest tightness that developed few days after her infusion, unclear if related. She tolerated the infusion itself without difficulty. Will monitor carefully.  -In the meantime, I recommend she continue oral iron BID  -continue GYN work up for uterine polyp as scheduled. [place] pending 8/6 -will follow with lab and f/u in 2-3 months   2. Right flank pain, cloudy urine  -she developed intermittent symptoms after her IV Feraheme infusion, I do not feel this is related to IV iron. UA and culture is negative for UTI.  -I recommend she maintain adequate oral hydration. Can try tylenol PRN for flank pain.   3. Vitamin D deficiency -Previous vit D level 20 in 12/2017. She has been on oral vitamin D supplement 50,000 IU  weekly. Plan to recheck level in a few months     PLAN: -Labs reviewed -Proceed with IV Feraheme x1 today -Lab and f/u in 2-3 months -Continue f/u and evaluation per GYN as scheduled    Orders Placed This Encounter  Procedures  . CBC with Differential (Cancer Center Only)    Standing Status:   Standing    Number of Occurrences:   50    Standing Expiration Date:   04/06/2024  . CMP ([facility] only)    Standing Status:   Standing    Number of Occurrences:   50    Standing Expiration Date:   04/06/2024  . Ferritin    Standing Status:   Standing    Number of Occurrences:   50    Standing Expiration Date:   04/06/2024  . Iron and TIBC    Standing Status:   Standing    Number of Occurrences:   50    Standing Expiration Date:   04/06/2024  . Vitamin D 25 hydroxy    Standing Status:   Future    Standing Expiration Date:   04/06/2019   All questions were answered. The patient knows to call the clinic with any problems, questions or concerns. No barriers to learning was detected.     Alla Feeling, NP 04/06/18   Addendum  I have seen the patient, examined her. I agree with the assessment and and plan and have edited the notes.   I have reviewed Ms Labree's lab results, which is consistent with IDA.  Her anemia has resolved after one doses of IV iron.  She tolerated very well.  I think her episodes of right flank pain was probably not related to the IV iron infusion.  Her iron level has improved, but ferritin was still on the low end, giving her upcoming surgery, I recommend her to have the second dose of IV Feraheme.  She agrees.  We plan to monitor her CBC and iron level periodically, and consider IV Feraheme if needed (ferritin <30 or low iron level).   Truitt Merle  04/06/2018

## 2018-04-06 ENCOUNTER — Encounter: Payer: Self-pay | Admitting: Nurse Practitioner

## 2018-04-06 ENCOUNTER — Inpatient Hospital Stay: Payer: No Typology Code available for payment source

## 2018-04-06 ENCOUNTER — Inpatient Hospital Stay (HOSPITAL_BASED_OUTPATIENT_CLINIC_OR_DEPARTMENT_OTHER): Payer: No Typology Code available for payment source | Admitting: Nurse Practitioner

## 2018-04-06 VITALS — BP 137/87 | HR 71 | Temp 98.5°F | Resp 17 | Ht 62.5 in | Wt 168.6 lb

## 2018-04-06 DIAGNOSIS — N920 Excessive and frequent menstruation with regular cycle: Secondary | ICD-10-CM

## 2018-04-06 DIAGNOSIS — N840 Polyp of corpus uteri: Secondary | ICD-10-CM | POA: Diagnosis not present

## 2018-04-06 DIAGNOSIS — D508 Other iron deficiency anemias: Secondary | ICD-10-CM

## 2018-04-06 DIAGNOSIS — R109 Unspecified abdominal pain: Secondary | ICD-10-CM | POA: Diagnosis not present

## 2018-04-06 DIAGNOSIS — E559 Vitamin D deficiency, unspecified: Secondary | ICD-10-CM

## 2018-04-06 MED ORDER — FERUMOXYTOL INJECTION 510 MG/17 ML
510.0000 mg | Freq: Once | INTRAVENOUS | Status: AC
  Administered 2018-04-06: 510 mg via INTRAVENOUS
  Filled 2018-04-06: qty 17

## 2018-04-06 MED ORDER — SODIUM CHLORIDE 0.9 % IV SOLN
Freq: Once | INTRAVENOUS | Status: AC
  Administered 2018-04-06: 11:00:00 via INTRAVENOUS
  Filled 2018-04-06: qty 250

## 2018-04-06 NOTE — Patient Instructions (Signed)

## 2018-04-07 ENCOUNTER — Telehealth: Payer: Self-pay | Admitting: Hematology

## 2018-04-07 ENCOUNTER — Encounter: Payer: Self-pay | Admitting: Gynecology

## 2018-04-07 ENCOUNTER — Ambulatory Visit (INDEPENDENT_AMBULATORY_CARE_PROVIDER_SITE_OTHER): Payer: No Typology Code available for payment source

## 2018-04-07 ENCOUNTER — Ambulatory Visit (INDEPENDENT_AMBULATORY_CARE_PROVIDER_SITE_OTHER): Payer: No Typology Code available for payment source | Admitting: Gynecology

## 2018-04-07 ENCOUNTER — Other Ambulatory Visit: Payer: Self-pay | Admitting: Gynecology

## 2018-04-07 VITALS — BP 124/82

## 2018-04-07 DIAGNOSIS — N839 Noninflammatory disorder of ovary, fallopian tube and broad ligament, unspecified: Secondary | ICD-10-CM

## 2018-04-07 DIAGNOSIS — D500 Iron deficiency anemia secondary to blood loss (chronic): Secondary | ICD-10-CM | POA: Diagnosis not present

## 2018-04-07 DIAGNOSIS — N921 Excessive and frequent menstruation with irregular cycle: Secondary | ICD-10-CM

## 2018-04-07 DIAGNOSIS — N939 Abnormal uterine and vaginal bleeding, unspecified: Secondary | ICD-10-CM

## 2018-04-07 DIAGNOSIS — N924 Excessive bleeding in the premenopausal period: Secondary | ICD-10-CM | POA: Diagnosis not present

## 2018-04-07 DIAGNOSIS — N838 Other noninflammatory disorders of ovary, fallopian tube and broad ligament: Secondary | ICD-10-CM

## 2018-04-07 DIAGNOSIS — N83201 Unspecified ovarian cyst, right side: Secondary | ICD-10-CM

## 2018-04-07 DIAGNOSIS — N83202 Unspecified ovarian cyst, left side: Secondary | ICD-10-CM

## 2018-04-07 NOTE — Patient Instructions (Signed)
Office will call you with biopsy results.  We discussed many options and you will decide what course of action you want to pursue as far as surgery and follow-up

## 2018-04-07 NOTE — Progress Notes (Signed)
    [name] [facility] 25-Nov-1970 [id number]        47 y.o.  [id number] presents for sonohysterogram.  History of heavy menses with some irregular bleeding.  Recently underwent a iron transfusion to help with her hemoglobin which was 9.  Also has a history of a right ovarian solid mass 37 mm on ultrasound 2017.  Vasectomy birth control.  Past medical history,surgical history, problem list, medications, allergies, family history and social history were all reviewed and documented in the EPIC chart.  Directed ROS with pertinent positives and negatives documented in the history of present illness/assessment and plan.  Exam: [name] assistant Vitals:   04/07/18 1543  BP: 124/82   General appearance:  Normal Abdomen soft nontender without masses guarding rebound Pelvic external BUS vagina normal.  Cervix normal.  Uterus grossly normal midline mobile nontender.  Adnexa without masses or tenderness.  Ultrasound transvaginal and transabdominal shows uterus overall normal in size and echotexture.  Endometrial echo 15 mm.  Right ovary with echogenic mass 46 x 35 x 41 mm with a 41 mm mean.  Color flow Doppler peripheral flow noted.  Left ovary with a solid/cystic area 27 x 22 x 23 mm with color flow Doppler to the wall.  Cul-de-sac negative.  Sonohysterogram performed, sterile technique, easy catheter introduction, good distention with no abnormality seen.  Endometrial biopsy taken.  Assessment/Plan:  47 y.o. [name] with heavy irregular menses and iron deficiency anemia.  We have talked about hysterectomy in the past multiple times.  I discussed again options for her bleeding assuming biopsy okay to include hormonal manipulation, Mirena IUD, endometrial ablation.  Upper issue is echogenic right ovarian mass at 37 mm 2 years ago and now 41 mm.  I think overall is relatively stable and chance of malignancy extremely low.  Appears more like a dermoid.  Second cyst on the left smaller not noted previously.   Options for cystic management include laparoscopic resection with retention of the ovaries versus unilateral or bilateral salpingo-oophorectomies.  The issues of ovarian conservation for ongoing hormonal production versus the risks of ovarian disease in the future both benign and malignant reviewed.  Proceeding with LAVH BSO/cystectomy possibilities also discussed.  Patient wants to think of all of her options and she will follow-up for the endometrial biopsy results.    [name] MD, 5:00 PM 04/07/2018

## 2018-04-07 NOTE — Telephone Encounter (Signed)
Appointments scheduled LMVM for patient with Date/Time/ letter/calendar also mailed per 8/5 los

## 2018-04-10 ENCOUNTER — Telehealth: Payer: Self-pay

## 2018-04-10 NOTE — Telephone Encounter (Signed)
Spoke with patient and informed her about biopsy result. She wants to proceed with scheduling hysterectomy but would like me to check insurance and let her know about costs first. She thinks she would like to remove one ovary. She was a bit ambivalent about it.    Need surgery order to check ins benefits.

## 2018-04-10 NOTE — Telephone Encounter (Signed)
-----   Message from [name], MD sent at 04/10/2018  8:15 AM EDT ----- Tell patient biopsy from the ultrasound was benign.  We discussed a lot of options at her office visit.  She needs to make up her mind as to how she wants to proceed.

## 2018-04-13 ENCOUNTER — Telehealth: Payer: Self-pay

## 2018-04-13 NOTE — Telephone Encounter (Signed)
Called patient and left voice mail per [facility] access note on file. I told her I checked ins benefits. Provided is covered at 100%.  Has a hospital copymt on $500.  I gave her next available date and asked her to call if any questions.

## 2018-04-15 MED FILL — VIT D2 1.25 MG (50,000 UNIT: 1.25 MG | 28 days supply | Qty: 8 | Fill #1

## 2018-06-17 ENCOUNTER — Telehealth: Payer: Self-pay

## 2018-06-17 NOTE — Telephone Encounter (Signed)
Patient called stating she is ready to schedule surgery. We had reviewed ins benefits back in August. We discussed available dates and she will check with her job and let me know asap when she would like to schedule.

## 2018-06-19 ENCOUNTER — Other Ambulatory Visit: Payer: Self-pay | Admitting: Gynecology

## 2018-06-19 ENCOUNTER — Telehealth: Payer: Self-pay

## 2018-06-19 DIAGNOSIS — D500 Iron deficiency anemia secondary to blood loss (chronic): Secondary | ICD-10-CM

## 2018-06-19 NOTE — Telephone Encounter (Signed)
Received this staff message from Dr. Loetta Rough " would have her come in now to check a CBC as she has required iron infusions and I want to make sure should her blood count is good going into the surgery. Verify that she would accept transfusion if needed. Also emphasized to her to continue on oral iron daily. "  I called patient and read her his note. She will come this Monday for lab appt for CBC checked.   She said she is fine with a blood transfusion if needed. She said whatever Dr. Loetta Rough needs to do.  Also, will start daily iron and stay on it until surgery.

## 2018-06-22 ENCOUNTER — Other Ambulatory Visit: Payer: No Typology Code available for payment source

## 2018-06-22 DIAGNOSIS — D500 Iron deficiency anemia secondary to blood loss (chronic): Secondary | ICD-10-CM

## 2018-06-22 LAB — CBC WITH DIFFERENTIAL/PLATELET
BASOS PCT: 0.5 %
Basophils Absolute: 29 cells/uL (ref 0–200)
EOS ABS: 51 {cells}/uL (ref 15–500)
Eosinophils Relative: 0.9 %
HCT: 38.5 % (ref 35.0–45.0)
Hemoglobin: 13 g/dL (ref 11.7–15.5)
Lymphs Abs: 1328 cells/uL (ref 850–3900)
MCH: 29.9 pg (ref 27.0–33.0)
MCHC: 33.8 g/dL (ref 32.0–36.0)
MCV: 88.5 fL (ref 80.0–100.0)
MPV: 10.1 fL (ref 7.5–12.5)
Monocytes Relative: 7.9 %
Neutro Abs: 3842 cells/uL (ref 1500–7800)
Neutrophils Relative %: 67.4 %
Platelets: 331 10*3/uL (ref 140–400)
RBC: 4.35 10*6/uL (ref 3.80–5.10)
RDW: 12.6 % (ref 11.0–15.0)
Total Lymphocyte: 23.3 %
WBC: 5.7 10*3/uL (ref 3.8–10.8)
WBCMIX: 450 {cells}/uL (ref 200–950)

## 2018-07-08 ENCOUNTER — Inpatient Hospital Stay: Payer: No Typology Code available for payment source | Admitting: Hematology

## 2018-07-08 ENCOUNTER — Inpatient Hospital Stay: Payer: No Typology Code available for payment source | Attending: Hematology and Oncology

## 2018-07-15 ENCOUNTER — Ambulatory Visit: Payer: Self-pay | Admitting: Internal Medicine

## 2018-07-15 ENCOUNTER — Encounter: Payer: Self-pay | Admitting: Family Medicine

## 2018-07-15 ENCOUNTER — Ambulatory Visit (INDEPENDENT_AMBULATORY_CARE_PROVIDER_SITE_OTHER): Payer: Self-pay | Admitting: Family Medicine

## 2018-07-15 VITALS — BP 122/88 | HR 61 | Temp 98.0°F | Wt 167.0 lb

## 2018-07-15 DIAGNOSIS — K047 Periapical abscess without sinus: Secondary | ICD-10-CM

## 2018-07-15 MED ORDER — AMOXICILLIN 875 MG PO TABS: 875.0000 mg | ORAL_TABLET | Freq: Two times a day (BID) | ORAL | 0 refills | Status: DC

## 2018-07-15 NOTE — Patient Instructions (Signed)
Dental Abscess A dental abscess is a collection of pus in or around a tooth. What are the causes? This condition is caused by a bacterial infection around the root of the tooth that involves the inner part of the tooth (pulp). It may result from:  Severe tooth decay.  Trauma to the tooth that allows bacteria to enter into the pulp, such as a broken or chipped tooth.  Severe gum disease around a tooth.  What are the signs or symptoms? Symptoms of this condition include:  Severe pain in and around the infected tooth.  Swelling and redness around the infected tooth, in the mouth, or in the face.  Tenderness.  Pus drainage.  Bad breath.  Bitter taste in the mouth.  Difficulty swallowing.  Difficulty opening the mouth.  Nausea.  Vomiting.  Chills.  Swollen neck glands.  Fever.  How is this diagnosed? This condition is diagnosed with examination of the infected tooth. During the exam, your dentist may tap on the infected tooth. Your dentist will also ask about your medical and dental history and may order X-rays. How is this treated? This condition is treated by eliminating the infection. This may be done with:  Antibiotic medicine.  A root canal. This may be performed to save the tooth.  Pulling (extracting) the tooth. This may also involve draining the abscess. This is done if the tooth cannot be saved.  Follow these instructions at home:  Take medicines only as directed by your dentist.  If you were prescribed antibiotic medicine, finish all of it even if you start to feel better.  Rinse your mouth (gargle) often with salt water to relieve pain or swelling.  Do not drive or operate heavy machinery while taking pain medicine.  Do not apply heat to the outside of your mouth.  Keep all follow-up visits as directed by your dentist. This is important. Contact a health care provider if:  Your pain is worse and is not helped by medicine. Get help right away  if:  You have a fever or chills.  Your symptoms suddenly get worse.  You have a very bad headache.  You have problems breathing or swallowing.  You have trouble opening your mouth.  You have swelling in your neck or around your eye. This information is not intended to replace advice given to you by your health care provider. Make sure you discuss any questions you have with your health care provider. Document Released: 08/19/2005 Document Revised: 12/28/2015 Document Reviewed: 08/16/2014 Elsevier Interactive Patient Education  2018 Elsevier Inc.  

## 2018-07-15 NOTE — Progress Notes (Signed)
[name] is a 47 y.o. female who presents today with concerns of tooth and facial pain on the left hand side. She reports that she does not have a dentist at this time but is scheduled for a hysterectomy on 08/10/2018 and was instructed by surgeon's office to have this tooth infection resolved or her surgery would be cancelled. She reports that it is important for her to have this surgery this year and has put off the procedure for the past few months. She denies any current systemic symptoms of fever or chills. She has not attempted to treat this condition with any medications up to this point.  Review of Systems  Constitutional: Negative for chills, fever and malaise/fatigue.  HENT: Negative for congestion, ear discharge, ear pain, sinus pain and sore throat.        Tooth pain and left sided facial pain  Eyes: Negative.   Respiratory: Negative for cough, sputum production and shortness of breath.   Cardiovascular: Negative.  Negative for chest pain.  Gastrointestinal: Negative for abdominal pain, diarrhea, nausea and vomiting.  Genitourinary: Negative for dysuria, frequency, hematuria and urgency.  Musculoskeletal: Negative for myalgias.  Skin: Negative.   Neurological: Negative for headaches.  Endo/Heme/Allergies: Negative.   Psychiatric/Behavioral: Negative.     O: Vitals:   07/15/18 1937  BP: 122/88  Pulse: 61  Temp: 98 F (36.7 C)  SpO2: 100%     Physical Exam  Constitutional: She is oriented to person, place, and time. Vital signs are normal. She appears well-developed and well-nourished. She is active.  Non-toxic appearance. She does not have a sickly appearance.  HENT:  Head: Normocephalic.  Right Ear: Hearing, tympanic membrane, external ear and ear canal normal.  Left Ear: Hearing, tympanic membrane, external ear and ear canal normal.  Nose: Nose normal.  Mouth/Throat: Uvula is midline, oropharynx is clear and moist and mucous membranes are normal. Abnormal  dentition. Dental abscesses present. No tonsillar exudate.  Left most distal molar is partially cracked on the anterior side   Neck: Normal range of motion. Neck supple.  Cardiovascular: Normal rate, regular rhythm, normal heart sounds and normal pulses.  Pulmonary/Chest: Effort normal and breath sounds normal.  Abdominal: Soft. Bowel sounds are normal.  Musculoskeletal: Normal range of motion.  Lymphadenopathy:       Head (right side): Tonsillar adenopathy present. No submental and no submandibular adenopathy present.       Head (left side): Tonsillar adenopathy present. No submental and no submandibular adenopathy present.    She has no cervical adenopathy.  Neurological: She is alert and oriented to person, place, and time.  Psychiatric: She has a normal mood and affect.  Vitals reviewed.  A: 1. Dental abscess    P: Discussed exam findings, diagnosis etiology and medication use and indications reviewed with patient. Follow- Up and discharge instructions provided. No emergent/urgent issues found on exam.  Patient verbalized understanding of information provided and agrees with plan of care (POC), all questions answered.  Discussed the need for evaluation by dentist for comprehensive treatment.  1. Dental abscess - amoxicillin (AMOXIL) 875 MG tablet; Take 1 tablet (875 mg total) by mouth 2 (two) times daily.

## 2018-07-28 DIAGNOSIS — Z0289 Encounter for other administrative examinations: Secondary | ICD-10-CM

## 2018-07-29 ENCOUNTER — Encounter: Payer: Self-pay | Admitting: Gynecology

## 2018-07-29 ENCOUNTER — Telehealth: Payer: Self-pay

## 2018-07-29 NOTE — Telephone Encounter (Signed)
Patient has abcessed back molar tooth and is scheduled to have it extracted on Friday 07/31/18.  She wants to make sure that you think it will be okay for her to do it now in light of upcoming surgery. She wanted you to advise what kind of anesthesia she should use for dental surgery since she will be under general on 12/9.

## 2018-07-29 NOTE — Telephone Encounter (Signed)
Patient advised.

## 2018-07-29 NOTE — Patient Instructions (Addendum)
[name]  07/29/2018      Your procedure is scheduled on 08-10-18    Report to [facility]  at 5:30  A.M.  Call this number if you have problems the morning of surgery: Goodnight WITH [facility].   Remember:  Do not eat food or drink liquids after midnight.   Take these medicines the morning of surgery with A SIP OF WATER: None   Do not wear jewelry, make-up or nail polish.  Do not wear lotions, powders, or perfumes, or deoderant.  Do not shave 48 hours prior to surgery.    Do not bring valuables to the hospital.  [facility] is not responsible for any belongings or valuables.  Contacts, dentures or bridgework may not be worn into surgery.  Leave your suitcase in the car.  After surgery it may be brought to your room.  For patients admitted to the hospital, discharge time will be determined by your treatment team.  Patients discharged the day of surgery will not be allowed to drive home.   Special instructions:  Please bring your medication in their original pill bottles.  Please read over the following fact sheets that you were given:       [facility] - Preparing for Surgery Before surgery, you can play an important role.  Because skin is not sterile, your skin needs to be as free of germs as possible.  You can reduce the number of germs on your skin by washing with CHG (chlorahexidine gluconate) soap before surgery.  CHG is an antiseptic cleaner which kills germs and bonds with the skin to continue killing germs even after washing. Please DO NOT use if you have an allergy to CHG or antibacterial soaps.  If your skin becomes reddened/irritated stop using the CHG and inform your nurse when you arrive at Short Stay. Do not shave (including legs and underarms) for at least 48 hours prior to the first CHG shower.  You may shave your face/neck. Please follow these instructions  carefully:  1.  Shower with CHG Soap the night before surgery and the  morning of Surgery.  2.  If you choose to wash your hair, wash your hair first as usual with your  normal  shampoo.  3.  After you shampoo, rinse your hair and body thoroughly to remove the  shampoo.                           4.  Use CHG as you would any other liquid soap.  You can apply chg directly  to the skin and wash                       Gently with a scrungie or clean washcloth.  5.  Apply the CHG Soap to your body ONLY FROM THE NECK DOWN.   Do not use on face/ open                           Wound or open sores. Avoid contact with eyes, ears mouth and genitals (private parts).                       Wash face,  Genitals (private parts) with your normal soap.  6.  Wash thoroughly, paying special attention to the area where your surgery  will be performed.  7.  Thoroughly rinse your body with warm water from the neck down.  8.  DO NOT shower/wash with your normal soap after using and rinsing off  the CHG Soap.                9.  Pat yourself dry with a clean towel.            10.  Wear clean pajamas.            11.  Place clean sheets on your bed the night of your first shower and do not  sleep with pets. Day of Surgery : Do not apply any lotions/deodorants the morning of surgery.  Please wear clean clothes to the hospital/surgery center.  FAILURE TO FOLLOW THESE INSTRUCTIONS MAY RESULT IN THE CANCELLATION OF YOUR SURGERY PATIENT SIGNATURE_________________________________  NURSE SIGNATURE__________________________________  ________________________________________________________________________

## 2018-07-29 NOTE — Telephone Encounter (Signed)
1.  She needs to do with the recommend as far as her tooth 2.  She needs to use whatever anesthesia they recommend and should not have an impact on her subsequent surgery.

## 2018-08-03 ENCOUNTER — Other Ambulatory Visit: Payer: Self-pay

## 2018-08-03 ENCOUNTER — Encounter (HOSPITAL_COMMUNITY)
Admission: RE | Admit: 2018-08-03 | Discharge: 2018-08-03 | Disposition: A | Discharge status: Home / Self Care | Payer: No Typology Code available for payment source | Source: Ambulatory Visit | Attending: Gynecology | Admitting: Gynecology

## 2018-08-03 ENCOUNTER — Encounter (HOSPITAL_COMMUNITY): Payer: Self-pay

## 2018-08-03 DIAGNOSIS — R9431 Abnormal electrocardiogram [ECG] [EKG]: Secondary | ICD-10-CM | POA: Diagnosis not present

## 2018-08-03 DIAGNOSIS — N83209 Unspecified ovarian cyst, unspecified side: Secondary | ICD-10-CM | POA: Insufficient documentation

## 2018-08-03 DIAGNOSIS — I10 Essential (primary) hypertension: Secondary | ICD-10-CM | POA: Insufficient documentation

## 2018-08-03 DIAGNOSIS — Z01818 Encounter for other preprocedural examination: Secondary | ICD-10-CM | POA: Insufficient documentation

## 2018-08-03 DIAGNOSIS — N939 Abnormal uterine and vaginal bleeding, unspecified: Secondary | ICD-10-CM | POA: Diagnosis not present

## 2018-08-03 LAB — COMPREHENSIVE METABOLIC PANEL
ALK PHOS: 81 U/L (ref 38–126)
ALT: 20 U/L (ref 0–44)
ANION GAP: 7 (ref 5–15)
AST: 24 U/L (ref 15–41)
Albumin: 4 g/dL (ref 3.5–5.0)
BILIRUBIN TOTAL: 0.8 mg/dL (ref 0.3–1.2)
BUN: 10 mg/dL (ref 6–20)
CALCIUM: 9 mg/dL (ref 8.9–10.3)
CO2: 27 mmol/L (ref 22–32)
CREATININE: 0.57 mg/dL (ref 0.44–1.00)
Chloride: 104 mmol/L (ref 98–111)
GFR calc non Af Amer: >60 mL/min (ref >60)
Glucose, Bld: 92 mg/dL (ref 70–99)
Potassium: 3.9 mmol/L (ref 3.5–5.1)
Sodium: 138 mmol/L (ref 135–145)
TOTAL PROTEIN: 7.8 g/dL (ref 6.5–8.1)

## 2018-08-03 LAB — CBC
HEMATOCRIT: 42.5 % (ref 36.0–46.0)
HEMOGLOBIN: 13.5 g/dL (ref 12.0–15.0)
MCH: 30 pg (ref 26.0–34.0)
MCHC: 31.8 g/dL (ref 30.0–36.0)
MCV: 94.4 fL (ref 80.0–100.0)
Platelets: 271 10*3/uL (ref 150–400)
RBC: 4.5 MIL/uL (ref 3.87–5.11)
RDW: 13.7 % (ref 11.5–15.5)
WBC: 5.5 10*3/uL (ref 4.0–10.5)
nRBC: 0 % (ref 0.0–0.2)

## 2018-08-03 LAB — HCG, SERUM, QUALITATIVE: PREG SERUM: NEGATIVE

## 2018-08-05 ENCOUNTER — Encounter: Payer: Self-pay | Admitting: Anesthesiology

## 2018-08-06 ENCOUNTER — Telehealth: Payer: Self-pay

## 2018-08-06 ENCOUNTER — Encounter: Payer: Self-pay | Admitting: Gynecology

## 2018-08-06 ENCOUNTER — Ambulatory Visit: Payer: No Typology Code available for payment source | Admitting: Gynecology

## 2018-08-06 VITALS — BP 138/80

## 2018-08-06 DIAGNOSIS — N838 Other noninflammatory disorders of ovary, fallopian tube and broad ligament: Secondary | ICD-10-CM

## 2018-08-06 DIAGNOSIS — N921 Excessive and frequent menstruation with irregular cycle: Secondary | ICD-10-CM

## 2018-08-06 MED FILL — LIDOCAINE 2% VISCOUS SOLN: 2 | 7 days supply | Qty: 100 | Fill #0

## 2018-08-06 NOTE — Progress Notes (Signed)
[facility] March 03, 1971 [id number]        47 y.o.  [id number] presents for her preoperative visit for upcoming hysterectomy.  The patient has a long history of menorrhagia and irregular bleeding to include bleedthrough episodes and iron deficient anemia requiring iron infusions.  She also has a persistent right ovarian solid mass in the 3 to 4 cm range that has been followed over the past several years.  Recent sonohysterogram showed a normal-appearing uterus and negative endometrial cavity with endometrial biopsy showing atrophic endometrium.  She has persistence of the right ovarian echogenic mass measuring 46 x 35 x 41 mm and the left ovary showing a 27 x 22 x 23 mm solid/cystic area with color flow Doppler.  Recent Pap smear this year was negative.  Past medical history,surgical history, problem list, medications, allergies, family history and social history were all reviewed and documented in the EPIC chart.  Directed ROS with pertinent positives and negatives documented in the history of present illness/assessment and plan.  Exam: [name] assistant Vitals:   08/06/18 1019  BP: 138/80   General appearance:  Normal HEENT normal Lungs clear Cardiac regular rate no rubs murmurs or gallops Abdomen soft nontender without masses guarding rebound Pelvic external BUS vagina normal.  Cervix normal.  Uterus grossly normal size midline mobile nontender.  Adnexa without masses or tenderness  Assessment/Plan:  47 y.o. [id number] with years history of irregular heavy menses to include bleedthrough episodes, iron deficiency anemia requiring iron infusions.  Also with a persistent right ovarian solid mass.  Endometrial biopsy was negative with sonohysterogram showing an empty endometrial cavity.  Options for management were reviewed to include hormonal manipulation, Mirena IUD and endometrial ablation for management of her heavy menses.  Options for her ovarian mass to include continued expectant  management or laparoscopic removal was also discussed.  Ultimately the patient has decided to proceed with LAVH with BSO.  We discussed options of leaving her left ovary after addressing the cystic solid area visualized on ultrasound for ongoing hormonal production excepting the risks of ovarian disease in the future to include reoperation as well as ovarian cancer.  Options to remove both ovaries was also discussed and the issues of lack of estrogen postoperatively with possible accelerated cardiovascular risk and bone risk as well as development of symptoms such as hot flushes and sweats.  After lengthy discussion as to the pros and cons of each approach the patient desires definitive surgery to include removal of both ovaries and fallopian tubes along with her uterus and will plan on starting on a low-dose estrogen replacement postoperatively.  The risks of ERT were reviewed to include increased risk of thrombosis such as stroke heart attack DVT in the breast cancer issue was discussed.  We discussed absolute irreversible sterility with hysterectomy recognizing she is using vasectomy birth control now.  We also discussed sexuality following hysterectomy and the risks of persistent orgasmic dysfunction and persistent dyspareunia was discussed.  The expected intraoperative and postoperative courses as well as the recovery period were reviewed. The risks of infection, prolonged antibiotics, reoperation for abscess or hematoma formation was discussed. The risks of hemorrhage necessitating transfusion and the risks of transfusion reaction, hepatitis, HIV, mad cow disease and other unknown entities was also discussed. Incisional complications to include opening and draining of incisions and closure by secondary intention, dehiscence and long-term issues of keloid/cosmetics and hernia formation were reviewed. The risk of inadvertent injury to internal organs including bowel, bladder, ureters, vessels, nerves  either  immediately recognized or delay recognized necessitating major exploratory reparative surgeries and future reparative surgeries including bowel resection, ostomy formation, bladder repair, ureteral damage repair was discussed with her. The patient's questions were answered to her satisfaction and she is ready to proceed with surgery.     [name] MD, 11:30 AM 08/06/2018

## 2018-08-06 NOTE — Patient Instructions (Signed)
Followup for surgery as scheduled. 

## 2018-08-06 NOTE — Telephone Encounter (Signed)
I received the following staff message from Dr. Loetta Rough: "Can you call over and tell them the permit should read laparoscopy with removal of uterus, fallopian tubes and both ovaries. I cannot see how I can change it in epic."  I called the OR and spoke with [name] and changed the surgery procedure to this.  I spoke with Langley Gauss at [facility] and staffed Dr. Loetta Rough back about op permit.    I called the [facility] [employer] ins co and spoke with [facility] and let him know procedure had change to BSO from [facility] but  CPT and ICD10 codes stayed the same.  He reviewed and told me if CPT and ICD10 remain the same no need to do anything.  (I had previously Prior [place].)

## 2018-08-06 NOTE — H&P (Signed)
[name] 1970/12/17 [id number]   History and Physical  Chief complaint: Menorrhagia, iron deficiency anemia, persistent right ovarian mass  History of present illness: 47 y.o. G2P2002 with a long history of menorrhagia and irregular bleeding to include bleedthrough episodes and iron deficient anemia requiring iron infusions.  She also has a persistent right ovarian solid mass in the 3 to 4 cm range that has been followed over the past several years.  Recent sonohysterogram showed a normal-appearing uterus and negative endometrial cavity with endometrial biopsy showing atrophic endometrium.  She has persistence of the right ovarian echogenic mass measuring 46 x 35 x 41 mm and the left ovary showing a 27 x 22 x 23 mm solid/cystic area with color flow Doppler.  Recent Pap smear this year was negative.  Options for management were reviewed with the patient and ultimately she has decided to proceed with LAVH BSO.  Past Medical History:  Diagnosis Date  . Anemia   . Anxiety   . ASCUS favoring benign 12/2015   negative high risk HPV  . Chicken pox   . GERD (gastroesophageal reflux disease)    during pregnancy   . 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 Hyperlipidemia   . Hypertension    during pregnancy   . 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 Reflux     Past Surgical History:  Procedure Laterality Date  . COLPOSCOPY    . TONSILLECTOMY AND ADENOIDECTOMY      Family History  Problem Relation Age of Onset  . Hypertension Mother   . Cancer Mother        LUNG CANCER/SMOKER  . Depression Mother   . Mental illness Mother   . Diabetes Father   . Hypertension Father   . Heart disease Father   . Hyperlipidemia Father   . Stroke Father   . Hypertension Brother   . Diabetes Brother   . Depression Brother   . Hyperlipidemia Brother   . Cancer Maternal Uncle        ESOPHAGEAL CA  . Diabetes Cousin   . Heart disease Cousin   . Cancer Maternal  Grandfather        Brain  . Stroke Paternal Grandmother   . Depression Maternal Grandmother   . Mental retardation Maternal Grandmother   . Stroke Paternal Grandfather     Social History:  reports that she quit smoking about 27 years ago. Her smoking use included cigarettes. She has never used smokeless tobacco. She reports that she drinks alcohol. She reports that she does not use drugs.  Allergies:  Allergies  Allergen Reactions  . Codeine Shortness Of Breath    Breathing problems  - liquid med   . Erythromycin     GI upset   . Levofloxacin     Heart racing     Medications: See epic for most current list  ROS:  Was performed and pertinent positives and negatives are included in the history of present illness.  Exam:  [name] assistant    Vitals:   08/06/18 1019  BP: 138/80   General appearance:  Normal HEENT normal Lungs clear Cardiac regular rate no rubs murmurs or gallops Abdomen soft nontender without masses guarding rebound Pelvic external BUS vagina normal.  Cervix normal.  Uterus grossly normal size midline mobile nontender.  Adnexa without masses or tenderness    Assessment/Plan:  47 y.o. [id number] with years history of irregular heavy menses to  include bleedthrough episodes, iron deficiency anemia requiring iron infusions.  Also with a persistent right ovarian solid mass.  Endometrial biopsy was negative with sonohysterogram showing an empty endometrial cavity.  Options for management were reviewed to include hormonal manipulation, Mirena IUD and endometrial ablation for management of her heavy menses.  Options for her ovarian mass to include continued expectant management or laparoscopic removal was also discussed.  Ultimately the patient has decided to proceed with LAVH with BSO.  We discussed options of leaving her left ovary after addressing the cystic solid area visualized on ultrasound for ongoing hormonal production accepting the risks of ovarian disease  in the future to include reoperation as well as ovarian cancer.  Options to remove both ovaries was also discussed and the issues of lack of estrogen postoperatively with possible accelerated cardiovascular risk and bone risk as well as development of symptoms such as hot flushes and sweats.  After lengthy discussion as to the pros and cons of each approach the patient desires definitive surgery to include removal of both ovaries and fallopian tubes along with her uterus and will plan on starting on a low-dose estrogen replacement postoperatively.  The risks of ERT were reviewed to include increased risk of thrombosis such as stroke heart attack DVT in the breast cancer issue was discussed.  We discussed absolute irreversible sterility with hysterectomy recognizing she is using vasectomy birth control now.  We also discussed sexuality following hysterectomy and the risks of persistent orgasmic dysfunction and persistent dyspareunia was discussed.  The expected intraoperative and postoperative courses as well as the recovery period were reviewed. The risks of infection, prolonged antibiotics, reoperation for abscess or hematoma formation was discussed. The risks of hemorrhage necessitating transfusion and the risks of transfusion reaction, hepatitis, HIV, mad cow disease and other unknown entities was also discussed. Incisional complications to include opening and draining of incisions and closure by secondary intention, dehiscence and long-term issues of keloid/cosmetics and hernia formation were reviewed. The risk of inadvertent injury to internal organs including bowel, bladder, ureters, vessels, nerves either immediately recognized or delay recognized necessitating major exploratory reparative surgeries and future reparative surgeries including bowel resection, ostomy formation, bladder repair, ureteral damage repair was discussed with her. The patient's questions were answered to her satisfaction and she is  ready to proceed with surgery.    [name] MD, 11:41 AM 08/06/2018

## 2018-08-09 NOTE — Anesthesia Preprocedure Evaluation (Addendum)
Anesthesia Evaluation  Patient identified by MRN, date of birth, ID band Patient awake    Reviewed: Allergy & Precautions, NPO status , Patient's Chart, lab work & pertinent test results  History of Anesthesia Complications Negative for: history of anesthetic complications  Airway Mallampati: II  TM Distance: >3 FB Neck ROM: Full    Dental  (+) Dental Advisory Given, Teeth Intact   Pulmonary former smoker,    breath sounds clear to auscultation       Cardiovascular hypertension,  Rhythm:Regular Rate:Normal     Neuro/Psych  Headaches, PSYCHIATRIC DISORDERS Anxiety Depression    GI/Hepatic Neg liver ROS, GERD  ,  Endo/Other   Obesity   Renal/GU negative Renal ROS     Musculoskeletal negative musculoskeletal ROS (+)   Abdominal   Peds  Hematology negative hematology ROS (+)   Anesthesia Other Findings   Reproductive/Obstetrics                            Anesthesia Physical Anesthesia Plan  ASA: II  Anesthesia Plan: General   Post-op Pain Management:    Induction: Intravenous  PONV Risk Score and Plan: 4 or greater and Treatment may vary due to age or medical condition, Ondansetron, Scopolamine patch - Pre-op, Midazolam and Dexamethasone  Airway Management Planned: Oral ETT  Additional Equipment: None  Intra-op Plan:   Post-operative Plan: Extubation in OR  Informed Consent: I have reviewed the patients History and Physical, chart, labs and discussed the procedure including the risks, benefits and alternatives for the proposed anesthesia with the patient or authorized representative who has indicated his/her understanding and acceptance.   Dental advisory given  Plan Discussed with: CRNA and Anesthesiologist  Anesthesia Plan Comments:        Anesthesia Quick Evaluation

## 2018-08-10 ENCOUNTER — Encounter (HOSPITAL_BASED_OUTPATIENT_CLINIC_OR_DEPARTMENT_OTHER): Payer: Self-pay

## 2018-08-10 ENCOUNTER — Ambulatory Visit (HOSPITAL_BASED_OUTPATIENT_CLINIC_OR_DEPARTMENT_OTHER)
Admission: RE | Admit: 2018-08-10 | Discharge: 2018-08-11 | Disposition: A | Discharge status: Home / Self Care | Payer: No Typology Code available for payment source | Source: Ambulatory Visit | Attending: Gynecology | Admitting: Gynecology

## 2018-08-10 ENCOUNTER — Ambulatory Visit (HOSPITAL_BASED_OUTPATIENT_CLINIC_OR_DEPARTMENT_OTHER): Payer: No Typology Code available for payment source | Admitting: Anesthesiology

## 2018-08-10 ENCOUNTER — Encounter (HOSPITAL_COMMUNITY)
Admission: RE | Disposition: A | Discharge status: Home / Self Care | Payer: Self-pay | Source: Ambulatory Visit | Attending: Gynecology

## 2018-08-10 DIAGNOSIS — E785 Hyperlipidemia, unspecified: Secondary | ICD-10-CM | POA: Insufficient documentation

## 2018-08-10 DIAGNOSIS — Z79899 Other long term (current) drug therapy: Secondary | ICD-10-CM | POA: Diagnosis not present

## 2018-08-10 DIAGNOSIS — N920 Excessive and frequent menstruation with regular cycle: Secondary | ICD-10-CM | POA: Diagnosis not present

## 2018-08-10 DIAGNOSIS — N801 Endometriosis of ovary: Secondary | ICD-10-CM

## 2018-08-10 DIAGNOSIS — R51 Headache: Secondary | ICD-10-CM | POA: Insufficient documentation

## 2018-08-10 DIAGNOSIS — N83201 Unspecified ovarian cyst, right side: Secondary | ICD-10-CM | POA: Diagnosis not present

## 2018-08-10 DIAGNOSIS — K219 Gastro-esophageal reflux disease without esophagitis: Secondary | ICD-10-CM | POA: Diagnosis not present

## 2018-08-10 DIAGNOSIS — N921 Excessive and frequent menstruation with irregular cycle: Secondary | ICD-10-CM

## 2018-08-10 DIAGNOSIS — N802 Endometriosis of fallopian tube: Secondary | ICD-10-CM

## 2018-08-10 DIAGNOSIS — I10 Essential (primary) hypertension: Secondary | ICD-10-CM | POA: Insufficient documentation

## 2018-08-10 DIAGNOSIS — Z87891 Personal history of nicotine dependence: Secondary | ICD-10-CM | POA: Insufficient documentation

## 2018-08-10 DIAGNOSIS — N72 Inflammatory disease of cervix uteri: Secondary | ICD-10-CM | POA: Insufficient documentation

## 2018-08-10 DIAGNOSIS — D279 Benign neoplasm of unspecified ovary: Secondary | ICD-10-CM

## 2018-08-10 DIAGNOSIS — F419 Anxiety disorder, unspecified: Secondary | ICD-10-CM | POA: Diagnosis not present

## 2018-08-10 DIAGNOSIS — N83202 Unspecified ovarian cyst, left side: Secondary | ICD-10-CM | POA: Insufficient documentation

## 2018-08-10 DIAGNOSIS — D259 Leiomyoma of uterus, unspecified: Secondary | ICD-10-CM | POA: Insufficient documentation

## 2018-08-10 DIAGNOSIS — Z881 Allergy status to other antibiotic agents status: Secondary | ICD-10-CM | POA: Diagnosis not present

## 2018-08-10 DIAGNOSIS — D509 Iron deficiency anemia, unspecified: Secondary | ICD-10-CM | POA: Insufficient documentation

## 2018-08-10 HISTORY — PX: LAPAROSCOPIC VAGINAL HYSTERECTOMY WITH SALPINGO OOPHORECTOMY: SHX6681

## 2018-08-10 SURGERY — HYSTERECTOMY, VAGINAL, LAPAROSCOPY-ASSISTED, WITH SALPINGO-OOPHORECTOMY
Anesthesia: General | Site: Abdomen | Laterality: Bilateral

## 2018-08-10 MED ORDER — PROMETHAZINE HCL 25 MG/ML IJ SOLN
6.2500 mg | INTRAMUSCULAR | Status: DC | PRN
  Filled 2018-08-10: qty 1

## 2018-08-10 MED ORDER — PROPOFOL 10 MG/ML IV BOLUS
INTRAVENOUS | Status: DC | PRN
  Administered 2018-08-10: 180 mg via INTRAVENOUS

## 2018-08-10 MED ORDER — HEMOSTATIC AGENTS (NO CHARGE) OPTIME
TOPICAL | Status: DC | PRN
  Administered 2018-08-10: 1 via TOPICAL

## 2018-08-10 MED ORDER — ROCURONIUM BROMIDE 10 MG/ML (PF) SYRINGE
PREFILLED_SYRINGE | INTRAVENOUS | Status: AC
  Filled 2018-08-10: qty 10

## 2018-08-10 MED ORDER — LIDOCAINE-EPINEPHRINE (PF) 1 %-1:200000 IJ SOLN
INTRAMUSCULAR | Status: DC | PRN
  Administered 2018-08-10: 10 mL

## 2018-08-10 MED ORDER — FENTANYL CITRATE (PF) 100 MCG/2ML IJ SOLN
INTRAMUSCULAR | Status: AC
  Filled 2018-08-10: qty 2

## 2018-08-10 MED ORDER — METOCLOPRAMIDE HCL 5 MG/ML IJ SOLN
INTRAMUSCULAR | Status: AC
  Filled 2018-08-10: qty 2

## 2018-08-10 MED ORDER — LACTATED RINGERS IV SOLN
INTRAVENOUS | Status: DC
  Administered 2018-08-10 (×2): via INTRAVENOUS
  Administered 2018-08-10: 1000 mL via INTRAVENOUS
  Administered 2018-08-10: 09:00:00 via INTRAVENOUS
  Filled 2018-08-10: qty 1000

## 2018-08-10 MED ORDER — OXYCODONE HCL 5 MG PO TABS
5.0000 mg | ORAL_TABLET | Freq: Once | ORAL | Status: DC | PRN
  Filled 2018-08-10: qty 1

## 2018-08-10 MED ORDER — SODIUM CHLORIDE 0.9 % IR SOLN
Status: DC | PRN
  Administered 2018-08-10: 3000 mL

## 2018-08-10 MED ORDER — DIPHENHYDRAMINE HCL 25 MG PO CAPS: 50.0000 mg | ORAL_CAPSULE | Freq: Four times a day (QID) | ORAL | Status: DC | PRN

## 2018-08-10 MED ORDER — KETOROLAC TROMETHAMINE 30 MG/ML IJ SOLN
INTRAMUSCULAR | Status: DC | PRN
  Administered 2018-08-10: 30 mg via INTRAVENOUS

## 2018-08-10 MED ORDER — PROPOFOL 10 MG/ML IV BOLUS
INTRAVENOUS | Status: AC
  Filled 2018-08-10: qty 40

## 2018-08-10 MED ORDER — DEXAMETHASONE SODIUM PHOSPHATE 10 MG/ML IJ SOLN
INTRAMUSCULAR | Status: AC
  Filled 2018-08-10: qty 1

## 2018-08-10 MED ORDER — MENTHOL 3 MG MT LOZG: 1.0000 | LOZENGE | OROMUCOSAL | Status: DC | PRN

## 2018-08-10 MED ORDER — BUPIVACAINE HCL (PF) 0.25 % IJ SOLN
INTRAMUSCULAR | Status: DC | PRN
  Administered 2018-08-10: 10 mL

## 2018-08-10 MED ORDER — ONDANSETRON HCL 4 MG PO TABS: 4.0000 mg | ORAL_TABLET | Freq: Four times a day (QID) | ORAL | Status: DC | PRN

## 2018-08-10 MED ORDER — FENTANYL CITRATE (PF) 100 MCG/2ML IJ SOLN
25.0000 ug | INTRAMUSCULAR | Status: DC | PRN
  Filled 2018-08-10: qty 1

## 2018-08-10 MED ORDER — METOCLOPRAMIDE HCL 5 MG/ML IJ SOLN
INTRAMUSCULAR | Status: DC | PRN
  Administered 2018-08-10: 10 mg via INTRAVENOUS

## 2018-08-10 MED ORDER — KETOROLAC TROMETHAMINE 30 MG/ML IJ SOLN
30.0000 mg | Freq: Four times a day (QID) | INTRAMUSCULAR | Status: DC
  Administered 2018-08-10 – 2018-08-11 (×3): 30 mg via INTRAVENOUS
  Filled 2018-08-10 (×3): qty 1

## 2018-08-10 MED ORDER — SCOPOLAMINE 1 MG/3DAYS TD PT72
MEDICATED_PATCH | TRANSDERMAL | Status: AC
  Filled 2018-08-10: qty 1

## 2018-08-10 MED ORDER — KETOROLAC TROMETHAMINE 30 MG/ML IJ SOLN
INTRAMUSCULAR | Status: AC
  Filled 2018-08-10: qty 1

## 2018-08-10 MED ORDER — FENTANYL CITRATE (PF) 250 MCG/5ML IJ SOLN
INTRAMUSCULAR | Status: AC
  Filled 2018-08-10: qty 5

## 2018-08-10 MED ORDER — ONDANSETRON HCL 4 MG/2ML IJ SOLN
INTRAMUSCULAR | Status: DC | PRN
  Administered 2018-08-10: 4 mg via INTRAVENOUS

## 2018-08-10 MED ORDER — FENTANYL CITRATE (PF) 100 MCG/2ML IJ SOLN
INTRAMUSCULAR | Status: DC | PRN
  Administered 2018-08-10: 25 ug via INTRAVENOUS
  Administered 2018-08-10 (×2): 50 ug via INTRAVENOUS
  Administered 2018-08-10: 25 ug via INTRAVENOUS
  Administered 2018-08-10: 50 ug via INTRAVENOUS
  Administered 2018-08-10 (×2): 25 ug via INTRAVENOUS
  Administered 2018-08-10: 100 ug via INTRAVENOUS

## 2018-08-10 MED ORDER — ROCURONIUM BROMIDE 100 MG/10ML IV SOLN
INTRAVENOUS | Status: DC | PRN
  Administered 2018-08-10: 10 mg via INTRAVENOUS
  Administered 2018-08-10: 50 mg via INTRAVENOUS
  Administered 2018-08-10: 10 mg via INTRAVENOUS

## 2018-08-10 MED ORDER — SODIUM CHLORIDE 0.9 % IV SOLN
2.0000 g | INTRAVENOUS | Status: AC
  Administered 2018-08-10: 2 g via INTRAVENOUS
  Filled 2018-08-10: qty 2

## 2018-08-10 MED ORDER — DEXTROSE-NACL 5-0.9 % IV SOLN
INTRAVENOUS | Status: DC
  Administered 2018-08-10 (×2): via INTRAVENOUS

## 2018-08-10 MED ORDER — SODIUM CHLORIDE 0.9 % IV SOLN
INTRAVENOUS | Status: AC
  Filled 2018-08-10: qty 2

## 2018-08-10 MED ORDER — OXYCODONE HCL 5 MG/5ML PO SOLN
5.0000 mg | Freq: Once | ORAL | Status: DC | PRN
  Filled 2018-08-10: qty 5

## 2018-08-10 MED ORDER — KETOROLAC TROMETHAMINE 30 MG/ML IJ SOLN
30.0000 mg | Freq: Four times a day (QID) | INTRAMUSCULAR | Status: DC
  Filled 2018-08-10: qty 1

## 2018-08-10 MED ORDER — SUGAMMADEX SODIUM 200 MG/2ML IV SOLN
INTRAVENOUS | Status: DC | PRN
  Administered 2018-08-10: 200 mg via INTRAVENOUS

## 2018-08-10 MED ORDER — ONDANSETRON HCL 4 MG/2ML IJ SOLN
INTRAMUSCULAR | Status: AC
  Filled 2018-08-10: qty 2

## 2018-08-10 MED ORDER — SCOPOLAMINE 1 MG/3DAYS TD PT72
MEDICATED_PATCH | TRANSDERMAL | Status: DC | PRN
  Administered 2018-08-10: 1 via TRANSDERMAL

## 2018-08-10 MED ORDER — ONDANSETRON HCL 4 MG/2ML IJ SOLN: 4.0000 mg | Freq: Four times a day (QID) | INTRAMUSCULAR | Status: DC | PRN

## 2018-08-10 MED ORDER — MIDAZOLAM HCL 2 MG/2ML IJ SOLN
INTRAMUSCULAR | Status: AC
  Filled 2018-08-10: qty 2

## 2018-08-10 MED ORDER — LIDOCAINE 2% (20 MG/ML) 5 ML SYRINGE
INTRAMUSCULAR | Status: AC
  Filled 2018-08-10: qty 5

## 2018-08-10 MED ORDER — VITAMINS A & D EX OINT
TOPICAL_OINTMENT | CUTANEOUS | Status: AC
  Filled 2018-08-10: qty 5

## 2018-08-10 MED ORDER — DEXAMETHASONE SODIUM PHOSPHATE 4 MG/ML IJ SOLN
INTRAMUSCULAR | Status: DC | PRN
  Administered 2018-08-10: 10 mg via INTRAVENOUS

## 2018-08-10 MED ORDER — SUGAMMADEX SODIUM 200 MG/2ML IV SOLN
INTRAVENOUS | Status: AC
  Filled 2018-08-10: qty 2

## 2018-08-10 MED ORDER — MIDAZOLAM HCL 5 MG/5ML IJ SOLN
INTRAMUSCULAR | Status: DC | PRN
  Administered 2018-08-10: 2 mg via INTRAVENOUS

## 2018-08-10 MED ORDER — LIDOCAINE HCL (CARDIAC) PF 100 MG/5ML IV SOSY
PREFILLED_SYRINGE | INTRAVENOUS | Status: DC | PRN
  Administered 2018-08-10: 100 mg via INTRAVENOUS

## 2018-08-10 SURGICAL SUPPLY — 73 items
ADH SKN CLS APL DERMABOND .7 (GAUZE/BANDAGES/DRESSINGS) ×1
APL SRG 38 LTWT LNG FL B (MISCELLANEOUS) ×1
APPLICATOR ARISTA FLEXITIP XL (MISCELLANEOUS) ×1 IMPLANT
BAG RETRIEVAL 10 (BASKET)
BARRIER ADHS 3X4 INTERCEED (GAUZE/BANDAGES/DRESSINGS) IMPLANT
BLADE CLIPPER SURG (BLADE) ×1 IMPLANT
BRR ADH 4X3 ABS CNTRL BYND (GAUZE/BANDAGES/DRESSINGS)
CANISTER SUCT 3000ML PPV (MISCELLANEOUS) IMPLANT
COVER BACK TABLE 60X90IN (DRAPES) ×2 IMPLANT
COVER MAYO STAND STRL (DRAPES) ×5 IMPLANT
COVER WAND RF STERILE (DRAPES) ×2 IMPLANT
DECANTER SPIKE VIAL GLASS SM (MISCELLANEOUS) ×2 IMPLANT
DERMABOND ADVANCED (GAUZE/BANDAGES/DRESSINGS) ×1
DERMABOND ADVANCED .7 DNX12 (GAUZE/BANDAGES/DRESSINGS) IMPLANT
DRAPE SHEET LG 3/4 BI-LAMINATE (DRAPES) ×1 IMPLANT
DRSG COVADERM PLUS 2X2 (GAUZE/BANDAGES/DRESSINGS) ×2 IMPLANT
DRSG OPSITE POSTOP 3X4 (GAUZE/BANDAGES/DRESSINGS) ×1 IMPLANT
DURAPREP 26ML APPLICATOR (WOUND CARE) ×2 IMPLANT
ELECT REM PT RETURN 9FT ADLT (ELECTROSURGICAL) ×2
ELECTRODE REM PT RTRN 9FT ADLT (ELECTROSURGICAL) ×1 IMPLANT
FILTER SMOKE EVAC LAPAROSHD (FILTER) ×2 IMPLANT
GAUZE 4X4 16PLY RFD (DISPOSABLE) ×2 IMPLANT
GAUZE PACKING 2X5 YD STRL (GAUZE/BANDAGES/DRESSINGS) IMPLANT
GAUZE PETROLATUM 1 X8 (GAUZE/BANDAGES/DRESSINGS) ×1 IMPLANT
GLOVE BIO SURGEON STRL SZ 6.5 (GLOVE) ×5 IMPLANT
GLOVE BIO SURGEON STRL SZ7 (GLOVE) ×1 IMPLANT
GLOVE BIO SURGEON STRL SZ7.5 (GLOVE) ×6 IMPLANT
GLOVE BIOGEL PI IND STRL 6.5 (GLOVE) IMPLANT
GLOVE BIOGEL PI IND STRL 7.0 (GLOVE) ×2 IMPLANT
GLOVE BIOGEL PI IND STRL 7.5 (GLOVE) IMPLANT
GLOVE BIOGEL PI INDICATOR 6.5 (GLOVE) ×1
GLOVE BIOGEL PI INDICATOR 7.0 (GLOVE) ×3
GLOVE BIOGEL PI INDICATOR 7.5 (GLOVE) ×1
GLOVE LITE  25/BX (GLOVE) IMPLANT
GOWN STRL REUS W/TWL LRG LVL3 (GOWN DISPOSABLE) ×8 IMPLANT
GOWN STRL REUS W/TWL XL LVL3 (GOWN DISPOSABLE) ×4 IMPLANT
HEMOSTAT ARISTA ABSORB 3G PWDR (MISCELLANEOUS) ×2 IMPLANT
HOLDER FOLEY CATH W/STRAP (MISCELLANEOUS) ×2 IMPLANT
KIT TURNOVER CYSTO (KITS) ×2 IMPLANT
NS IRRIG 500ML POUR BTL (IV SOLUTION) ×2 IMPLANT
PACK LAVH (CUSTOM PROCEDURE TRAY) ×2 IMPLANT
PACK TRENDGUARD 450 HYBRID PRO (MISCELLANEOUS) IMPLANT
PAD OB MATERNITY 4.3X12.25 (PERSONAL CARE ITEMS) ×2 IMPLANT
PAD PREP 24X48 CUFFED NSTRL (MISCELLANEOUS) ×2 IMPLANT
PROTECTOR NERVE ULNAR (MISCELLANEOUS) ×2 IMPLANT
SCISSORS LAP 5X35 DISP (ENDOMECHANICALS) IMPLANT
SET IRRIG TUBING LAPAROSCOPIC (IRRIGATION / IRRIGATOR) ×2 IMPLANT
SET IRRIG Y TYPE TUR BLADDER L (SET/KITS/TRAYS/PACK) ×1 IMPLANT
SHEARS HARMONIC ACE PLUS 36CM (ENDOMECHANICALS) ×2 IMPLANT
SHEARS HARMONIC ACE PLUS 45CM (MISCELLANEOUS) IMPLANT
SOLUTION ELECTROLUBE (MISCELLANEOUS) ×2 IMPLANT
SUT MNCRL AB 4-0 PS2 18 (SUTURE) ×4 IMPLANT
SUT PLAIN 4 0 FS 2 27 (SUTURE) IMPLANT
SUT VIC AB 0 CT1 18XCR BRD8 (SUTURE) ×1 IMPLANT
SUT VIC AB 0 CT1 36 (SUTURE) ×2 IMPLANT
SUT VIC AB 0 CT1 8-18 (SUTURE) ×4
SUT VIC AB 2-0 SH 27 (SUTURE) ×2
SUT VIC AB 2-0 SH 27XBRD (SUTURE) ×1 IMPLANT
SUT VIC AB 4-0 PS2 27 (SUTURE) ×4 IMPLANT
SUT VICRYL 0 TIES 12 18 (SUTURE) ×2 IMPLANT
SUT VICRYL 0 UR6 27IN ABS (SUTURE) ×2 IMPLANT
SYR 3ML 23GX1 SAFETY (SYRINGE) IMPLANT
SYR BULB IRRIGATION 50ML (SYRINGE) ×2 IMPLANT
SYS BAG RETRIEVAL 10MM (BASKET)
SYSTEM BAG RETRIEVAL 10MM (BASKET) IMPLANT
TOWEL OR 17X24 6PK STRL BLUE (TOWEL DISPOSABLE) ×4 IMPLANT
TRAY FOLEY W/BAG SLVR 14FR (SET/KITS/TRAYS/PACK) ×2 IMPLANT
TRENDGUARD 450 HYBRID PRO PACK (MISCELLANEOUS) ×2
TROCAR XCEL NON-BLD 11X100MML (ENDOMECHANICALS) IMPLANT
TROCAR XCEL NON-BLD 5MMX100MML (ENDOMECHANICALS) ×3 IMPLANT
TUBING INSUF HEATED (TUBING) ×2 IMPLANT
WARMER LAPAROSCOPE (MISCELLANEOUS) ×2 IMPLANT
WATER STERILE IRR 500ML POUR (IV SOLUTION) ×2 IMPLANT

## 2018-08-10 NOTE — Progress Notes (Signed)
[name] 06/13/1971 [id number]   Day of Surgery s/p Procedure(s): LAPAROSCOPIC ASSISTED VAGINAL HYSTERECTOMY WITH BILATERAL SALPINGO OOPHORECTOMY  Subjective: Patient reports feels well, no acute distress, pain severity reported mild, Yes.   taking PO, foley catheter out, Yes.   voiding, Yes.   ambulating, No. passing flatus  Objective: Vital signs in last 24 hours: Temp:  [97.4 F (36.3 C)-98.7 F (37.1 C)] 98.6 F (37 C) (12/09 1653) Pulse Rate:  [53-86] 80 (12/09 1653) Resp:  [11-20] 20 (12/09 1653) BP: (85-131)/(43-92) 130/71 (12/09 1653) SpO2:  [91 %-100 %] 100 % (12/09 1653) Weight:  [73.5 kg] 73.5 kg (12/09 0600)    EXAM General: awake, alert and no distress Resp: clear to auscultation bilaterally Cardio: regular rate and rhythm GI: soft, nontender, bowel sounds present, incisions dry Lower Extremities: Without swelling or tenderness Vaginal Bleeding: reported scant   Assessment: s/p Procedure(s): LAPAROSCOPIC ASSISTED VAGINAL HYSTERECTOMY WITH BILATERAL SALPINGO OOPHORECTOMY: stable, progressing well and tolerating diet.  Good pain relief with IV Toradol  Plan: Continue routine post operative care.  Plan discharge in a.m.  LOS: 0 days    [name] MD, 5:29 PM 08/10/2018

## 2018-08-10 NOTE — Transfer of Care (Signed)
Immediate Anesthesia Transfer of Care Note  Patient: [name]  Procedure(s) Performed: Procedure(s) (LRB): LAPAROSCOPIC ASSISTED VAGINAL HYSTERECTOMY WITH BILATERAL SALPINGO OOPHORECTOMY (Bilateral)  Patient Location: PACU  Anesthesia Type: General  Level of Consciousness: awake, sedated, patient cooperative and responds to stimulation  Airway & Oxygen Therapy: Patient Spontanous Breathing and Patient connected to [place] oxygen  Post-op Assessment: Report given to PACU RN, Post -op Vital signs reviewed and stable and Patient moving all extremities  Post vital signs: Reviewed and stable  Complications: No apparent anesthesia complications

## 2018-08-10 NOTE — Op Note (Signed)
[facility] 1970/10/27 [id number]   Post Operative Note   Date of surgery:  08/10/2018  Pre Op Dx: Menorrhagia, iron deficiency anemia, persistent right ovarian mass  Post Op Dx: Menorrhagia, iron deficiency anemia, right ovarian mass, endometriosis  Procedure: Laparoscopic assisted vaginal hysterectomy with bilateral salpingo-oophorectomy  Surgeon:  [name]  Assistant: Dellis Filbert, ML  Anesthesia:  General  EBL: 017 cc  Complications:  None  Specimen: #1 opening cell washing #2 uterus #3 right and left fallopian tubes #4 right and left ovaries to pathology  Findings: EUA: External BUS vagina normal.  Cervix normal.  Uterus retroverted grossly normal size midline, mobile   Operative: Anterior cul-de-sac normal.  Posterior cul-de-sac normal.  Uterus normal size, shape and contour.  Right and left fallopian tubes normal length, caliber and fimbriated ends.  Left ovary grossly normal adherent to the left pelvic sidewall at the level of the uterosacral ligament.  Right ovary enlarged at 4 cm, firmly adherent to the right pelvic sidewall with evidence of superficial endometriosis.  Distal sigmoid colon was "hitched" to the left pelvic sidewall below the attachment of the ovary, left undisturbed.  Upper abdominal exam shows liver smooth with no abnormalities.  Gallbladder grossly normal.  Appendix free and mobile.  No upper abdominal adhesions.  Procedure: The patient was taken to the operating room, placed in the low dorsal lithotomy position, underwent general anesthesia and received an abdominal preparation per nursing personnel.  The timeout was performed by the surgical team.  The vagina and perineum were cleansed with Betadine and a Hulka tenaculum was placed without difficulty.  The patient was draped in the usual fashion.  A vertical infraumbilical incision was made and the 10 mm direct entry laparoscopic trocar was placed under direct visualization without difficulty.   Examination of the pelvic organs and upper abdominal exam was carried out with findings noted above.  Right and left 5 mm suprapubic ports were then placed under direct visualization after transillumination for the vessels without difficulty.  An opening cell washing was obtained.  The right ovary was freed from the right pelvic sidewall using the harmonic scalpel tracing the course of the ureter throughout the procedure.  Subsequently the right infundibulopelvic ligament and vessels were transected with the harmonic scalpel and the peritoneal reflections of the broad ligament were incised to the level of the round ligament.  This was also incised with harmonic scalpel and the anterior vesicouterine peritoneal fold was incised to the midline using the harmonic scalpel.  The left ovary was also adherent to the left pelvic sidewall at the level of the uterosacral ligament although no active endometriosis was visualized.  This was freed with attention to the course of the ureter on the left as well as the sigmoid colon which was adherent below the attachment of the ovary.  The left infundibulopelvic ligament and vessels were transected using the harmonic scalpel and the uterus was freed in a similar manner to the right meeting the vesicouterine peritoneal incision in the midline.  Attention was then turned to the vaginal portion of the procedure and the patient was placed in the high dorsal lithotomy position, the cervix visualized with a weighted speculum, grasped with a tenaculum and the cervical mucosa was circumferentially injected using 1% lidocaine with epinephrine mixture at 1-200,000.  The paracervical mucosa was then incised using the electrocautery and the anterior vesico-uterine space was sharply developed and the abdomen was ultimately entered without difficulty.  The posterior cul-de-sac was also sharply entered without  difficulty and a long weighted speculum was placed.  The right and left uterosacral  sacral ligaments were identified, clamped, cut and ligated using 0 Vicryl suture and tagged for future reference.  The uterus was then freed from its attachments through progressive clamping, cutting and ligating using 0 Vicryl suture of the paracervical and parametrial tissues identifying and ligating the uterine vessels bilaterally.  To aid in visualization the cervix was cored from the specimen and the uterus was incised in several passes to collapse the remaining specimen and allow for clamping, cutting and ligating of the remaining attachments using 0 Vicryl suture.  The long weighted speculum was replaced with a shorter weighted speculum and the posterior vaginal cuff was run from uterosacral ligament to uterosacral ligament using 0 Vicryl suture in a running interlocking stitch.  The vagina was closed anterior to posterior using 0 Vicryl suture in interrupted figure-of-eight stitches.  The vagina was irrigated showing adequate hemostasis and attention was then turned to the laparoscopic portion of the procedure.  The surgical team regloved and regowned, the abdomen was reinsufflated and copiously irrigated and no active bleeding sites were identified.  Prophylactic Arista was placed into the cul-de-sac.  The area where the sigmoid colon was attached to the left pelvic sidewall was reinspected and found to be away from suture and harmonic scalpel incision lines.  The instruments were removed, the ports removed after evacuating the gas and all skin incisions were injected using 0.25% Marcaine.  The infraumbilical port was closed with a 0 Vicryl subcutaneous fascial stitch and the right suprapubic port was closed using 4-0 Vicryl and the left suprapubic port closed using 4-0 Monocryl.  The infraumbilical skin was reapproximated using 4-0 Monocryl in a running subcuticular stitch.  Dermabond skin adhesive was applied all incisions.  The sponge, needle and instrument counts were verified correct and the patient  was wanded with the sponge detection device.  The specimens were identified to the nursing personnel for pathology.  The patient received intraoperative Toradol and was awakened without difficulty and taken to the recovery room in good condition having tolerated the procedure well.   [name] MD, 10:18 AM 08/10/2018

## 2018-08-10 NOTE — Anesthesia Procedure Notes (Signed)
Procedure Name: Intubation Date/Time: 08/10/2018 7:43 AM Performed by: Justice Rocher, CRNA Pre-anesthesia Checklist: Patient identified, Emergency Drugs available, Suction available and Patient being monitored Patient Re-evaluated:Patient Re-evaluated prior to induction Oxygen Delivery Method: Circle system utilized Preoxygenation: Pre-oxygenation with 100% oxygen Induction Type: IV induction Ventilation: Mask ventilation without difficulty Laryngoscope Size: Mac and 3 Grade View: Grade II Tube type: Oral Tube size: 7.0 mm Number of attempts: 1 Airway Equipment and Method: Stylet and Oral airway Placement Confirmation: ETT inserted through vocal cords under direct vision,  positive ETCO2 and breath sounds checked- equal and bilateral Secured at: 22 cm Tube secured with: Tape Dental Injury: Teeth and Oropharynx as per pre-operative assessment

## 2018-08-10 NOTE — H&P (Signed)
The patient was examined.  I reviewed the proposed surgery and consent form with the patient.  The patient re-signed her consent form as the original stated possibly remove ovaries and the patient definitely wants her ovaries removed as we discussed at her preoperative visit and I again confirmed that this morning.  The dictated history and physical is current and accurate and all questions were answered. The patient is ready to proceed with surgery and has a realistic understanding and expectation for the outcome.   [name] MD, 7:10 AM 08/10/2018

## 2018-08-10 NOTE — Anesthesia Postprocedure Evaluation (Signed)
Anesthesia Post Note  Patient: Joelene Millin Solomon  Procedure(s) Performed: LAPAROSCOPIC ASSISTED VAGINAL HYSTERECTOMY WITH BILATERAL SALPINGO OOPHORECTOMY (Bilateral Abdomen)     Patient location during evaluation: PACU Anesthesia Type: General Level of consciousness: awake and alert Pain management: pain level controlled Vital Signs Assessment: post-procedure vital signs reviewed and stable Respiratory status: spontaneous breathing, nonlabored ventilation and respiratory function stable Cardiovascular status: blood pressure returned to baseline and stable Postop Assessment: no apparent nausea or vomiting Anesthetic complications: no    Last Vitals:  Vitals:   08/10/18 1142 08/10/18 1208  BP: 114/77 116/67  Pulse: 64 (!) 58  Resp: 16 16  Temp: 36.7 C 36.6 C  SpO2: 100% 99%    Last Pain:  Vitals:   08/10/18 1142  TempSrc:   PainSc: [place] Brock

## 2018-08-11 ENCOUNTER — Encounter (HOSPITAL_BASED_OUTPATIENT_CLINIC_OR_DEPARTMENT_OTHER): Payer: Self-pay | Admitting: Gynecology

## 2018-08-11 DIAGNOSIS — N920 Excessive and frequent menstruation with regular cycle: Secondary | ICD-10-CM | POA: Diagnosis not present

## 2018-08-11 MED ORDER — IBUPROFEN 800 MG PO TABS: 800.0000 mg | ORAL_TABLET | Freq: Three times a day (TID) | ORAL | 1 refills | Status: DC | PRN

## 2018-08-11 MED FILL — VIT D2 1.25 MG (50,000 UNIT: 1.25 MG | 28 days supply | Qty: 8 | Fill #0

## 2018-08-11 NOTE — Discharge Instructions (Signed)
Laparoscopically Assisted Vaginal Hysterectomy, Care After Refer to this sheet in the next few weeks. These instructions provide you with information on caring for yourself after your procedure. Your health care provider may also give you more specific instructions. Your treatment has been planned according to current medical practices, but problems sometimes occur. Call your health care provider if you have any problems or questions after your procedure. What can I expect after the procedure? After your procedure, it is typical to have the following:  Abdominal pain. You will be given pain medicine to control it.  Sore throat from the breathing tube that was inserted during surgery.  Follow these instructions at home:  Only take over-the-counter or prescription medicines for pain, discomfort, or fever as directed by your health care provider.  Do not take aspirin. It can cause bleeding.  Do not drive when taking pain medicine.  Follow your health care provider's advice regarding diet, exercise, lifting, driving, and general activities.  Resume your usual diet as directed and allowed.  Get plenty of rest and sleep.  Do not douche, use tampons, or have sexual intercourse for at least 6 weeks, or until your health care provider gives you permission.  Change your bandages (dressings) as directed by your health care provider.  Monitor your temperature and notify your health care provider of a fever.  Take showers instead of baths for 2-3 weeks.  Do not drink alcohol until your health care provider gives you permission.  If you develop constipation, you may take a mild laxative with your health care provider's permission. Bran foods may help with constipation problems. Drinking enough fluids to keep your urine clear or pale yellow may help as well.  Try to have someone home with you for 1-2 weeks to help around the house.  Keep all of your follow-up appointments as directed by your  health care provider. Contact a health care provider if:  You have swelling, redness, or increasing pain around your incision sites.  You have pus coming from your incision.  You notice a bad smell coming from your incision.  Your incision breaks open.  You feel dizzy or lightheaded.  You have pain or bleeding when you urinate.  You have persistent diarrhea.  You have persistent nausea and vomiting.  You have abnormal vaginal discharge.  You have a rash.  You have any type of abnormal reaction or develop an allergy to your medicine.  You have poor pain control with your prescribed medicine. Get help right away if:  You have a fever.  You have severe abdominal pain.  You have chest pain.  You have shortness of breath.  You faint.  You have pain, swelling, or redness in your leg.  You have heavy vaginal bleeding with blood clots. This information is not intended to replace advice given to you by your health care provider. Make sure you discuss any questions you have with your health care provider. Document Released: 08/08/2011 Document Revised: 01/25/2016 Document Reviewed: 03/04/2013 Elsevier Interactive Patient Education  2017 [employer].

## 2018-08-13 ENCOUNTER — Encounter: Payer: Self-pay | Admitting: Gynecology

## 2018-08-19 ENCOUNTER — Encounter: Payer: Self-pay | Admitting: Gynecology

## 2018-08-24 ENCOUNTER — Encounter: Payer: Self-pay | Admitting: Gynecology

## 2018-08-24 ENCOUNTER — Ambulatory Visit (INDEPENDENT_AMBULATORY_CARE_PROVIDER_SITE_OTHER): Payer: No Typology Code available for payment source | Admitting: Gynecology

## 2018-08-24 VITALS — BP 124/82

## 2018-08-24 DIAGNOSIS — Z9889 Other specified postprocedural states: Secondary | ICD-10-CM

## 2018-08-24 NOTE — Patient Instructions (Signed)
Follow up for your next postop visit in 2 weeks

## 2018-08-24 NOTE — Progress Notes (Signed)
    [facility] [birth year] [id number]        47 y.o.  [id number] presents for her first postoperative visit status post LAVH BSO.  Doing well without complaints.  Past medical history,surgical history, problem list, medications, allergies, family history and social history were all reviewed and documented in the EPIC chart.  Directed ROS with pertinent positives and negatives documented in the history of present illness/assessment and plan.  Exam: Wandra Scot assistant Vitals:   08/24/18 0842  BP: 124/82   General appearance:  Normal Abdomen soft nontender without masses guarding rebound.  Incisions healing nicely. Pelvic external BUS vagina with cuff intact healing nicely.  Bimanual without masses or tenderness.  Assessment/Plan:  47 y.o. [id number] status post LAVH BSO.  Final pathology showed leiomyoma, endometriosis and Brenner's tumor.  Not having menopausal symptoms.  We discussed options for HRT.  Risks versus benefits to include increased risk of thrombosis in the breast cancer issue versus benefits to include symptom relief as well as possible cardiovascular and bone health when started early.  At this point the patient does not want to start HRT.  She would prefer to monitor.  If she would develop menopausal symptoms then she would consider starting HRT.  She will slowly resume normal activities with the exception of continued pelvic rest.  Will follow-up in 2 weeks for her next postoperative visit.    [name] MD, 9:29 AM 08/24/2018

## 2018-09-07 ENCOUNTER — Telehealth: Payer: Self-pay

## 2018-09-07 NOTE — Telephone Encounter (Signed)
Patient called and said [employer] told her to have me fax them her office visit 08/24/18. She said I was out when she can in so she called today.  I faxed the note to [facility].

## 2018-09-15 ENCOUNTER — Ambulatory Visit (INDEPENDENT_AMBULATORY_CARE_PROVIDER_SITE_OTHER): Payer: No Typology Code available for payment source | Admitting: Gynecology

## 2018-09-15 ENCOUNTER — Encounter: Payer: Self-pay | Admitting: Gynecology

## 2018-09-15 VITALS — BP 118/76

## 2018-09-15 DIAGNOSIS — Z4889 Encounter for other specified surgical aftercare: Secondary | ICD-10-CM

## 2018-09-15 NOTE — Patient Instructions (Signed)
Follow-up in June when due for annual exam, sooner as needed.  Slowly resume all activities.  Continue with nothing in the vagina until 8 weeks postop.

## 2018-09-15 NOTE — Progress Notes (Signed)
    [name] 02/22/1971 [id number]        48 y.o.  [name] presents for 4-week postoperative visit status post LAVH BSO.  Doing well without complaints.  Elected not to take HRT and is not having significant symptoms.  Past medical history,surgical history, problem list, medications, allergies, family history and social history were all reviewed and documented in the EPIC chart.  Directed ROS with pertinent positives and negatives documented in the history of present illness/assessment and plan.  Exam: [name] assistant Vitals:   09/15/18 1139  BP: 118/76   General appearance:  Normal Abdomen soft nontender without masses guarding rebound.  Incisions healing nicely. Pelvic external BUS vagina with cuff intact.  Healing nicely without masses or tenderness.  Assessment/Plan:  48 y.o. [name] with normal postoperative visit.  We will slowly resume normal activities.  Continue pelvic rest until 8 weeks postop.  Follow-up in June when due for annual exam, sooner if any issues.    [name] MD, 12:19 PM 09/15/2018

## 2018-09-17 ENCOUNTER — Encounter: Payer: Self-pay | Admitting: Gynecology

## 2018-09-26 ENCOUNTER — Emergency Department (HOSPITAL_COMMUNITY)
Admission: EM | Admit: 2018-09-26 | Discharge: 2018-09-26 | Disposition: A | Discharge status: Home / Self Care | Payer: No Typology Code available for payment source | Attending: Emergency Medicine | Admitting: Emergency Medicine

## 2018-09-26 ENCOUNTER — Emergency Department (HOSPITAL_COMMUNITY): Payer: No Typology Code available for payment source

## 2018-09-26 ENCOUNTER — Other Ambulatory Visit: Payer: Self-pay

## 2018-09-26 ENCOUNTER — Emergency Department (HOSPITAL_BASED_OUTPATIENT_CLINIC_OR_DEPARTMENT_OTHER): Payer: No Typology Code available for payment source

## 2018-09-26 DIAGNOSIS — R002 Palpitations: Secondary | ICD-10-CM | POA: Insufficient documentation

## 2018-09-26 DIAGNOSIS — I10 Essential (primary) hypertension: Secondary | ICD-10-CM | POA: Insufficient documentation

## 2018-09-26 DIAGNOSIS — R0602 Shortness of breath: Secondary | ICD-10-CM | POA: Diagnosis present

## 2018-09-26 DIAGNOSIS — R072 Precordial pain: Secondary | ICD-10-CM | POA: Diagnosis not present

## 2018-09-26 DIAGNOSIS — Z79899 Other long term (current) drug therapy: Secondary | ICD-10-CM | POA: Diagnosis not present

## 2018-09-26 DIAGNOSIS — Z87891 Personal history of nicotine dependence: Secondary | ICD-10-CM | POA: Diagnosis not present

## 2018-09-26 DIAGNOSIS — R609 Edema, unspecified: Secondary | ICD-10-CM

## 2018-09-26 DIAGNOSIS — R2243 Localized swelling, mass and lump, lower limb, bilateral: Secondary | ICD-10-CM | POA: Insufficient documentation

## 2018-09-26 LAB — CBC WITH DIFFERENTIAL/PLATELET
Abs Immature Granulocytes: 0.02 10*3/uL (ref 0.00–0.07)
Basophils Absolute: 0 10*3/uL (ref 0.0–0.1)
Basophils Relative: 0 %
Eosinophils Absolute: 0.1 10*3/uL (ref 0.0–0.5)
Eosinophils Relative: 1 %
HEMATOCRIT: 38 % (ref 36.0–46.0)
Hemoglobin: 11.8 g/dL — ABNORMAL LOW (ref 12.0–15.0)
Immature Granulocytes: 0 %
Lymphocytes Relative: 40 %
Lymphs Abs: 2.5 10*3/uL (ref 0.7–4.0)
MCH: 27.5 pg (ref 26.0–34.0)
MCHC: 31.1 g/dL (ref 30.0–36.0)
MCV: 88.6 fL (ref 80.0–100.0)
Monocytes Absolute: 0.5 10*3/uL (ref 0.1–1.0)
Monocytes Relative: 7 %
Neutro Abs: 3.2 10*3/uL (ref 1.7–7.7)
Neutrophils Relative %: 52 %
Platelets: 292 10*3/uL (ref 150–400)
RBC: 4.29 MIL/uL (ref 3.87–5.11)
RDW: 13.6 % (ref 11.5–15.5)
WBC: 6.2 10*3/uL (ref 4.0–10.5)
nRBC: 0 % (ref 0.0–0.2)

## 2018-09-26 LAB — COMPREHENSIVE METABOLIC PANEL
ALT: 18 U/L (ref 0–44)
AST: 23 U/L (ref 15–41)
Albumin: 3.7 g/dL (ref 3.5–5.0)
Alkaline Phosphatase: 96 U/L (ref 38–126)
Anion gap: 9 (ref 5–15)
BUN: 15 mg/dL (ref 6–20)
CHLORIDE: 106 mmol/L (ref 98–111)
CO2: 24 mmol/L (ref 22–32)
Calcium: 9.3 mg/dL (ref 8.9–10.3)
Creatinine, Ser: 0.75 mg/dL (ref 0.44–1.00)
GFR calc Af Amer: >60 mL/min (ref >60)
Glucose, Bld: 109 mg/dL — ABNORMAL HIGH (ref 70–99)
Potassium: 4 mmol/L (ref 3.5–5.1)
Sodium: 139 mmol/L (ref 135–145)
Total Bilirubin: 0.5 mg/dL (ref 0.3–1.2)
Total Protein: 7.6 g/dL (ref 6.5–8.1)

## 2018-09-26 LAB — I-STAT TROPONIN, ED
Troponin i, poc: 0 ng/mL (ref 0.00–0.08)
Troponin i, poc: 0.01 ng/mL (ref 0.00–0.08)

## 2018-09-26 LAB — MAGNESIUM: Magnesium: 1.9 mg/dL (ref 1.7–2.4)

## 2018-09-26 LAB — LIPASE, BLOOD: Lipase: 37 U/L (ref 11–51)

## 2018-09-26 MED ORDER — IOPAMIDOL (ISOVUE-370) INJECTION 76%
INTRAVENOUS | Status: AC
  Filled 2018-09-26: qty 100

## 2018-09-26 MED ORDER — IOPAMIDOL (ISOVUE-370) INJECTION 76%
100.0000 mL | Freq: Once | INTRAVENOUS | Status: AC | PRN
  Administered 2018-09-26: 100 mL via INTRAVENOUS

## 2018-09-26 NOTE — ED Notes (Signed)
Patient verbalizes understanding of discharge instructions. Opportunity for questioning and answers were provided. Armband removed by staff, pt discharged from ED ambulatory.   

## 2018-09-26 NOTE — Discharge Instructions (Signed)
Your work-up today was overall reassuring.  We did not find any evidence of a cardiac or lung cause of your symptoms.  There was no evidence of blood clot in your legs or your lungs.  We did not see any evidence of arrhythmias or tachycardia (fast heart rate) while you were here in the emergency department.  Please stay hydrated and follow-up with both a primary doctor as well as a cardiologist.  If any symptoms change or worsen, please return to the nearest emergency department.

## 2018-09-26 NOTE — Progress Notes (Signed)
LE venous duplex       has been completed. Preliminary results can be found under CV proc through chart review. Lular Letson, BS, RDMS, RVT   

## 2018-09-26 NOTE — ED Triage Notes (Signed)
Pt recently had hysterectomy dec 9th ; states " I wasn't moving as much as I should have " pt c/o sob with excertion  Along with palpitations x 2-3 days ; pt denies any chest pain at this time

## 2018-09-26 NOTE — ED Provider Notes (Signed)
[facility] EMERGENCY DEPARTMENT Provider Note   CSN: [id number] Arrival date & time: 09/26/18  1817     History   Chief Complaint Chief Complaint  Patient presents with  . Shortness of Breath    HPI [name] is a 48 y.o. female.  The history is provided by the patient and medical records. No language interpreter was used.  Chest Pain  Pain location:  Substernal area Pain quality: aching   Pain radiates to:  Does not radiate Pain severity:  Moderate Onset quality:  Gradual Duration:  3 days Timing:  Intermittent Progression:  Waxing and waning Chronicity:  New Relieved by:  Nothing Worsened by:  Exertion and deep breathing Ineffective treatments:  None tried Associated symptoms: palpitations and shortness of breath   Associated symptoms: no back pain, no cough, no diaphoresis, no fatigue, no fever, no headache, no nausea and no vomiting   Risk factors: surgery   Risk factors: no prior DVT/PE     Past Medical History:  Diagnosis Date  . Anemia   . Anxiety   . ASCUS favoring benign 12/2015   negative high risk HPV  . Chicken pox   . GERD (gastroesophageal reflux disease)    during pregnancy   . 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 Hyperlipidemia   . Hypertension    during pregnancy   . 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 Reflux     Patient Active Problem List   Diagnosis Date Noted  . Menorrhagia 08/10/2018  . Vitamin D deficiency 12/17/2017  . Iron deficiency anemia 11/13/2017  . Alopecia 11/03/2017  . Seborrheic dermatitis of scalp 11/03/2017  . Cyst of ovary 11/03/2017  . Atypical squamous cells of undetermined significance on cytologic smear of cervix (ASC-US) 11/03/2017  . Right foot pain 11/03/2017  . Thrombocytosis ([place]) 10/31/2017  . Leg edema, left 06/11/2013  . Petechiae 06/11/2013  . LGSIL (low grade squamous intraepithelial dysplasia)   . Reflux   . Hyperlipidemia 04/23/2007    . GAD (generalized anxiety disorder) 04/23/2007  . DEPRESSION 04/23/2007  . GERD 04/23/2007  . HYPERTENSION, GESTATIONAL 04/23/2007  . GESTATIONAL DIABETES 04/23/2007  . DE QUERVAIN'S TENOSYNOVITIS 04/23/2007    Past Surgical History:  Procedure Laterality Date  . COLPOSCOPY    . LAPAROSCOPIC VAGINAL HYSTERECTOMY WITH SALPINGO OOPHORECTOMY Bilateral 08/10/2018   Procedure: LAPAROSCOPIC ASSISTED VAGINAL HYSTERECTOMY WITH BILATERAL SALPINGO OOPHORECTOMY;  Surgeon: [name], MD;  Location: [facility];  Service: Gynecology;  Laterality: Bilateral;  . TONSILLECTOMY AND ADENOIDECTOMY       OB History    Gravida  2   Para  2   Term  2   Preterm      AB      Living  2     SAB      TAB      Ectopic      Multiple      Live Births               Home Medications    Prior to Admission medications   Medication Sig Start Date End Date Taking? Authorizing Provider  Calcium Carb-Cholecalciferol (CALCIUM 1000 + D PO) Take 1 tablet by mouth daily.     [provider]  chlorhexidine (PERIDEX) 0.12 % solution Use as directed 15 mLs in the mouth or throat 2 (two) times daily.    [provider]  Cholecalciferol 50000 units  capsule Take 1 capsule (50,000 Units total) by mouth once a week. 12/17/17   McLean-Scocuzza, Nino Glow, MD  ferrous sulfate (SLOW RELEASE IRON) 160 (50 Fe) MG TBCR SR tablet Take 1 tablet by mouth 2 (two) times daily.    [provider]  ibuprofen (ADVIL,MOTRIN) 800 MG tablet Take 1 tablet (800 mg total) by mouth every 8 (eight) hours as needed. 08/11/18   Fontaine, Belinda Block, MD    Family History Family History  Problem Relation Age of Onset  . Hypertension Mother   . Cancer Mother        LUNG CANCER/SMOKER  . Depression Mother   . Mental illness Mother   . Diabetes Father   . Hypertension Father   . Heart disease Father   . Hyperlipidemia Father   . Stroke Father   . Hypertension Brother   .  Diabetes Brother   . Depression Brother   . Hyperlipidemia Brother   . Cancer Maternal Uncle        ESOPHAGEAL CA  . Diabetes Cousin   . Heart disease Cousin   . Cancer Maternal Grandfather        Brain  . Stroke Paternal Grandmother   . Depression Maternal Grandmother   . Mental retardation Maternal Grandmother   . Stroke Paternal Grandfather     Social History Social History   Tobacco Use  . Smoking status: Former Smoker    Types: Cigarettes    Last attempt to quit: 09/02/1990    Years since quitting: 28.0  . Smokeless tobacco: Never Used  . Tobacco comment: smoked socially in high school   Substance Use Topics  . Alcohol use: Yes    Alcohol/week: 0.0 standard drinks    Comment: occasionally   . Drug use: No     Allergies   Codeine; Erythromycin; and Levofloxacin   Review of Systems Review of Systems  Constitutional: Negative for chills, diaphoresis, fatigue and fever.  HENT: Negative for congestion.   Eyes: Negative for visual disturbance.  Respiratory: Positive for chest tightness and shortness of breath. Negative for cough and wheezing.   Cardiovascular: Positive for chest pain, palpitations and leg swelling.  Gastrointestinal: Negative for constipation, diarrhea, nausea and vomiting.  Genitourinary: Negative for dysuria.  Musculoskeletal: Negative for back pain, neck pain and neck stiffness.  Skin: Negative for rash and wound.  Neurological: Negative for light-headedness and headaches.  Psychiatric/Behavioral: Negative for agitation.  All other systems reviewed and are negative.    Physical Exam Updated Vital Signs BP (!) 152/89   Pulse 80   Temp 97.9 F (36.6 C) (Oral)   Resp 18   Ht 5\' 2"  (1.575 m)   Wt 71.7 kg   LMP 08/02/2018 (Exact Date)   SpO2 99%   BMI 28.90 kg/m   Physical Exam Vitals signs and nursing note reviewed.  Constitutional:      General: She is not in acute distress.    Appearance: She is well-developed. She is not  toxic-appearing or diaphoretic.  HENT:     Head: Normocephalic and atraumatic.  Eyes:     Conjunctiva/sclera: Conjunctivae normal.     Pupils: Pupils are equal, round, and reactive to light.  Neck:     Musculoskeletal: Neck supple.  Cardiovascular:     Rate and Rhythm: Normal rate and regular rhythm.     Heart sounds: No murmur.  Pulmonary:     Effort: Pulmonary effort is normal. No respiratory distress.     Breath sounds: Normal  breath sounds. No decreased breath sounds, wheezing, rhonchi or rales.  Chest:     Chest wall: No mass, deformity or tenderness.  Abdominal:     Palpations: Abdomen is soft.     Tenderness: There is no abdominal tenderness.  Musculoskeletal: Normal range of motion.     Right lower leg: She exhibits no tenderness. No edema.     Left lower leg: She exhibits no tenderness. No edema.  Skin:    General: Skin is warm and dry.     Capillary Refill: Capillary refill takes less than 2 seconds.     Findings: No rash.  Neurological:     General: No focal deficit present.     Mental Status: She is alert and oriented to person, place, and time.     Cranial Nerves: No cranial nerve deficit.      ED Treatments / Results  Labs (all labs ordered are listed, but only abnormal results are displayed) Labs Reviewed  CBC WITH DIFFERENTIAL/PLATELET - Abnormal; Notable for the following components:      Result Value   Hemoglobin 11.8 (*)    All other components within normal limits  COMPREHENSIVE METABOLIC PANEL - Abnormal; Notable for the following components:   Glucose, Bld 109 (*)    All other components within normal limits  LIPASE, BLOOD  MAGNESIUM  I-STAT TROPONIN, ED  I-STAT TROPONIN, ED    EKG EKG Interpretation  Date/Time:  Saturday September 26 2018 18:39:04 EST Ventricular Rate:  74 PR Interval:    QRS Duration: 113 QT Interval:  389 QTC Calculation: 432 R Axis:   50 Text Interpretation:  Sinus rhythm Borderline short PR interval Borderline  intraventricular conduction delay Borderline repolarization abnormality When compared to prior, no signifiacnt changes seen.  No STEMI Confirmed by Antony Blackbird [phone]) on 09/26/2018 6:58:25 PM   Radiology Ct Angio Chest Pe W And/or Wo Contrast  Result Date: 09/26/2018 CLINICAL DATA:  Shortness of breath on exertion. History of hysterectomy on August 10, 2018. EXAM: CT ANGIOGRAPHY CHEST WITH CONTRAST TECHNIQUE: Multidetector CT imaging of the chest was performed using the standard protocol during bolus administration of intravenous contrast. Multiplanar CT image reconstructions and MIPs were obtained to evaluate the vascular anatomy. CONTRAST:  166mL ISOVUE-370 IOPAMIDOL (ISOVUE-370) INJECTION 76% COMPARISON:  Chest radiograph 12/15/2013 FINDINGS: Cardiovascular: Satisfactory opacification of the pulmonary arteries to the segmental level. No evidence of pulmonary embolism. Normal heart size. No pericardial effusion. Mediastinum/Nodes: No enlarged mediastinal, hilar, or axillary lymph nodes. Thyroid gland, trachea, and esophagus demonstrate no significant findings. Small hiatal hernia. Lungs/Pleura: Lungs are clear. No pleural effusion or pneumothorax. Upper Abdomen: No acute abnormality. Musculoskeletal: No chest wall abnormality. No acute or significant osseous findings. Review of the MIP images confirms the above findings. IMPRESSION: No evidence of pulmonary embolus or other vascular abnormality within the thorax. Small hiatal hernia. Electronically Signed   By: Fidela Salisbury M.D.   On: 09/26/2018 20:39   Vas [place] Lower Extremity Venous (dvt) (only [facility])  Result Date: 09/26/2018  Lower Venous Study Indications: Ankle edema.  Performing Technologist: June Leap RDMS, RVT  Examination Guidelines: A complete evaluation includes B-mode imaging, spectral Doppler, color Doppler, and power Doppler as needed of all accessible portions of each vessel. Bilateral testing is considered an integral part of  a complete examination. Limited examinations for reoccurring indications may be performed as noted.  Right Venous Findings: +---------+---------------+---------+-----------+----------+-------+          CompressibilityPhasicitySpontaneityPropertiesSummary +---------+---------------+---------+-----------+----------+-------+ CFV  Full           Yes      Yes                          +---------+---------------+---------+-----------+----------+-------+ SFJ      Full                                                 +---------+---------------+---------+-----------+----------+-------+ FV Prox  Full                                                 +---------+---------------+---------+-----------+----------+-------+ FV Mid   Full                                                 +---------+---------------+---------+-----------+----------+-------+ FV DistalFull                                                 +---------+---------------+---------+-----------+----------+-------+ PFV      Full                                                 +---------+---------------+---------+-----------+----------+-------+ POP      Full           Yes      Yes                          +---------+---------------+---------+-----------+----------+-------+ PTV      Full                                                 +---------+---------------+---------+-----------+----------+-------+ PERO     Full                                                 +---------+---------------+---------+-----------+----------+-------+  Left Venous Findings: +---------+---------------+---------+-----------+----------+-------+          CompressibilityPhasicitySpontaneityPropertiesSummary +---------+---------------+---------+-----------+----------+-------+ CFV      Full           Yes      Yes                          +---------+---------------+---------+-----------+----------+-------+ SFJ       Full                                                 +---------+---------------+---------+-----------+----------+-------+ FV Prox  Full                                                 +---------+---------------+---------+-----------+----------+-------+ FV Mid   Full                                                 +---------+---------------+---------+-----------+----------+-------+ FV DistalFull                                                 +---------+---------------+---------+-----------+----------+-------+ PFV      Full                                                 +---------+---------------+---------+-----------+----------+-------+ POP      Full           Yes      Yes                          +---------+---------------+---------+-----------+----------+-------+ PTV      Full                                                 +---------+---------------+---------+-----------+----------+-------+ PERO     Full                                                 +---------+---------------+---------+-----------+----------+-------+    Summary: Right: There is no evidence of deep vein thrombosis in the lower extremity. No cystic structure found in the popliteal fossa. Left: There is no evidence of deep vein thrombosis in the lower extremity. No cystic structure found in the popliteal fossa.  *See table(s) above for measurements and observations.    Preliminary     Procedures Procedures (including critical care time)  Medications Ordered in ED Medications  iopamidol (ISOVUE-370) 76 % injection (has no administration in time range)  iopamidol (ISOVUE-370) 76 % injection 100 mL (100 mLs Intravenous Contrast Given 09/26/18 2010)     Initial Impression / Assessment and Plan / ED Course  I have reviewed the triage vital signs and the nursing notes.  Pertinent labs & imaging results that were available during my care of the patient were reviewed by me and  considered in my medical decision making (see chart for details).     [name] is a 48 y.o. female with a past medical history significant for hypertension, hyperlipidemia, generalized anxiety disorder, GERD, depression, and recent hysterectomy who presents with concern for chest discomfort and ruling out pulmonary embolism.  Patient reports that she has a strong family history of DVT and PE.  She reports that for the last 2 or 3 days she has been having exertional chest  pain and shortness of breath.  She reports that when she has been exerting herself or going up and down stairs her symptoms have worsened.  She reports the pain is moderate to severe.  She reports palpitations and feeling her heart racing.  He reports when she spoke with family and friends, they were very concerned that blood clot for her.  She says that recently she has noticed some veins bulging in both of her ankles with some mild swelling.  She is concerned this may be indicative of DVTs bilaterally.  She reports no significant pain in her legs however.  She denies any fevers, chills, congestion, or cough.  She denies any urinary symptoms.  No abdominal pain.  No syncopal episodes.  On arrival, vital signs are reassuring and patient is maintaining oxygen saturations on room air.  Mild hypertension seen.  Patient is extremely anxious.  On exam, lungs are clear and chest is nontender.  Abdomen is nontender.  No murmur.  Legs have normal sensation and pulses.  No significant swelling or abnormality discovered.  Patient reported transient pain and swelling of her left middle finger which is resolved with no tenderness.  Unclear etiology of this.  Exam otherwise unremarkable.  Due to the high concern for PE with her recent surgery, strong family history, exertional and pleuritic chest pain and shortness of breath, and palpitations, patient had work-up including a CT PE study, bilateral leg ultrasounds, and labs.  If work-up is  reassuring, anticipate discharge home.  Patient's bilateral leg ultrasounds were negative for DVT.  CT scan showed no evidence of PE.  Laboratory testing reassuring.  Patient had no episodes of arrhythmia or tachycardia during her ED stay.  Low suspicion for cardiac or pulmonary cause of symptoms.  Patient is extremely anxious.  Suspect anxiety playing a role in her symptoms.  Patient will follow-up with PCP as well as cardiology.  Patient was reassured about her work-up and understood follow-up instructions.  Patient understood return precautions.  Patient other worsens or concerns and was discharged in good condition.   Final Clinical Impressions(s) / ED Diagnoses   Final diagnoses:  Precordial pain  Palpitations    ED Discharge Orders    None     Clinical Impression: 1. Precordial pain   2. Palpitations     Disposition: Discharge  Condition: Good  I have discussed the results, Dx and Tx plan with the pt(& family if present). He/she/they expressed understanding and agree(s) with the plan. Discharge instructions discussed at great length. Strict return precautions discussed and pt &/or family have verbalized understanding of the instructions. No further questions at time of discharge.    New Prescriptions   No medications on file    Follow Up: McLean-Scocuzza, Nino Glow, MD [place] [address] [phone]     [place] [address] [phone] Schedule an appointment as soon as possible for a visit    [place] [address] [id number] mc [place]       Tegeler, Gwenyth Allegra, MD 09/26/18 [phone]

## 2018-11-06 ENCOUNTER — Ambulatory Visit: Payer: Self-pay | Admitting: Internal Medicine

## 2018-11-06 NOTE — Telephone Encounter (Signed)
Chest pain started around 1:45 today.  Patient states blood pressure is 160/92 about 30 minutes ago.  Patient does not take medication on a regular basis. / Was pumping gas and then started having chest pain.  It was radiating upward.  After several moments, the pain subsided. / [id number] on 08-10-18.  Developed heaviness in legs after sugery / Patient states she does get pain in between her shoulder blades and shoulder when she takes a deep breath in  / Patient was seen in ED in January to r/o blood clots / Patient is not short of breath or in any pain right now, NAD.  Patient is still working on her courier route.  Patient states she is worried because a family member had a massive heart attack at 102.  Reassured patient and provided signs to look out for.  Advised if she experienced trouble breathing, sweating, chest pain, to go to the ED. / PCP is out of office next week.  Appointment made with Philis Nettle for Monday 3-9 @ 9:00am.  Reason for Disposition . [1] Intermittent chest pain from "angina" AND [2] NO increase in severity or frequency  Answer Assessment - Initial Assessment Questions 1. LOCATION: "Where does it hurt?"       Center, left chest 2. RADIATION: "Does the pain go anywhere else?" (e.g., into neck, jaw, arms, back) Pain was radiating upward. Patient states when she takes a deep breathe in she has pain in between her shoulder blades and in her shoulder 3. ONSET: "When did the chest pain begin?" (Minutes, hours or days)      Began around 1:45 4. PATTERN "Does the pain come and go, or has it been constant since it started?"  "Does it get worse with exertion?"    Constant for about 5 minutes then went away 5. DURATION: "How long does it last" (e.g., seconds, minutes, hours) 5 minutes 6. SEVERITY: "How bad is the pain?"  (e.g., Scale 1-10; mild, moderate, or severe)    About a 5 7. CARDIAC RISK FACTORS: "Do you have any history of heart problems or risk factors for  heart disease?" (e.g., prior heart attack, angina; high blood pressure, diabetes, being overweight, high cholesterol, smoking, or strong family history of heart disease)     High cholesterol, gestational diabetes,  8. PULMONARY RISK FACTORS: "Do you have any history of lung disease?"  (e.g., blood clots in lung, asthma, emphysema, birth control pills)    No. Recently checked for blood clots in ED in January 9. CAUSE: "What do you think is causing the chest pain?" unsure 10. OTHER SYMPTOMS: "Do you have any other symptoms?" (e.g., dizziness, nausea, vomiting, sweating, fever, difficulty breathing, cough)     no 11. PREGNANCY: "Is there any chance you are pregnant?" "When was your last menstrual period?" hysterectomy  Protocols used: CHEST PAIN-A-AH

## 2018-11-09 ENCOUNTER — Telehealth: Payer: Self-pay

## 2018-11-09 ENCOUNTER — Ambulatory Visit: Payer: Self-pay | Admitting: Family Medicine

## 2018-11-09 NOTE — Telephone Encounter (Signed)
Copied from [place] [id number]. Topic: Quick Communication - Appointment Cancellation >> Nov 09, 2018  8:53 AM Margot Ables wrote: Patient called to cancel appointment scheduled for today 9:00am [facility]. Patient has rescheduled their appointment.  Pt called stating she is running late this morning, RS for 3:00pm with Lauren  Route to department's PEC pool.

## 2018-11-10 ENCOUNTER — Encounter: Payer: Self-pay | Admitting: Family Medicine

## 2018-11-10 ENCOUNTER — Ambulatory Visit (INDEPENDENT_AMBULATORY_CARE_PROVIDER_SITE_OTHER): Payer: No Typology Code available for payment source

## 2018-11-10 ENCOUNTER — Ambulatory Visit (INDEPENDENT_AMBULATORY_CARE_PROVIDER_SITE_OTHER): Payer: No Typology Code available for payment source | Admitting: Family Medicine

## 2018-11-10 VITALS — BP 138/98 | HR 72 | Temp 98.0°F | Resp 18 | Ht 62.5 in | Wt 163.8 lb

## 2018-11-10 DIAGNOSIS — R030 Elevated blood-pressure reading, without diagnosis of hypertension: Secondary | ICD-10-CM | POA: Diagnosis not present

## 2018-11-10 DIAGNOSIS — R079 Chest pain, unspecified: Secondary | ICD-10-CM | POA: Diagnosis not present

## 2018-11-10 DIAGNOSIS — Z8249 Family history of ischemic heart disease and other diseases of the circulatory system: Secondary | ICD-10-CM

## 2018-11-10 DIAGNOSIS — R29898 Other symptoms and signs involving the musculoskeletal system: Secondary | ICD-10-CM

## 2018-11-10 DIAGNOSIS — R002 Palpitations: Secondary | ICD-10-CM | POA: Diagnosis not present

## 2018-11-10 LAB — VITAMIN D 25 HYDROXY (VIT D DEFICIENCY, FRACTURES): VITD: 21.4 ng/mL — ABNORMAL LOW (ref 30.00–100.00)

## 2018-11-10 LAB — COMPREHENSIVE METABOLIC PANEL
ALT: 15 U/L (ref 0–35)
AST: 20 U/L (ref 0–37)
Albumin: 4.5 g/dL (ref 3.5–5.2)
Alkaline Phosphatase: 100 U/L (ref 39–117)
BUN: 11 mg/dL (ref 6–23)
CALCIUM: 10.1 mg/dL (ref 8.4–10.5)
CO2: 27 mEq/L (ref 19–32)
Chloride: 101 mEq/L (ref 96–112)
Creatinine, Ser: 0.65 mg/dL (ref 0.40–1.20)
GFR: 97.67 mL/min (ref >60.00)
Glucose, Bld: 92 mg/dL (ref 70–99)
Potassium: 3.8 mEq/L (ref 3.5–5.1)
Sodium: 138 mEq/L (ref 135–145)
Total Bilirubin: 1 mg/dL (ref 0.2–1.2)
Total Protein: 8.4 g/dL — ABNORMAL HIGH (ref 6.0–8.3)

## 2018-11-10 LAB — LIPID PANEL
Cholesterol: 223 mg/dL — ABNORMAL HIGH (ref 0–200)
HDL: 68 mg/dL (ref >39.00)
LDL Cholesterol: 127 mg/dL — ABNORMAL HIGH (ref 0–99)
NonHDL: 154.62
Total CHOL/HDL Ratio: 3
Triglycerides: 140 mg/dL (ref 0.0–149.0)
VLDL: 28 mg/dL (ref 0.0–40.0)

## 2018-11-10 LAB — CBC
HCT: 40.6 % (ref 36.0–46.0)
Hemoglobin: 13.7 g/dL (ref 12.0–15.0)
MCHC: 33.8 g/dL (ref 30.0–36.0)
MCV: 84 fl (ref 78.0–100.0)
PLATELETS: 352 10*3/uL (ref 150.0–400.0)
RBC: 4.84 Mil/uL (ref 3.87–5.11)
RDW: 15.1 % (ref 11.5–15.5)
WBC: 5.7 10*3/uL (ref 4.0–10.5)

## 2018-11-10 LAB — B12 AND FOLATE PANEL
Folate: 22.1 ng/mL (ref >5.9)
Vitamin B-12: 320 pg/mL (ref 211–911)

## 2018-11-10 MED ORDER — ASPIRIN EC 81 MG PO TBEC: 81.0000 mg | DELAYED_RELEASE_TABLET | Freq: Every day | ORAL | 1 refills | Status: DC

## 2018-11-10 MED ORDER — CARVEDILOL 3.125 MG PO TABS: 3.1250 mg | ORAL_TABLET | Freq: Two times a day (BID) | ORAL | 3 refills | Status: DC

## 2018-11-10 NOTE — Progress Notes (Signed)
Subjective:    Patient ID: [name], female    DOB: 1970-09-25, 48 y.o.   MRN: [id number]  HPI   Patient presents to clinic complaining of an episode of chest pain that occurred on Friday, 11/06/2018.  States he began with a left-sided chest pain, left shoulder pain and pain radiating up into left jaw.  Episode lasted for possibly 2 to 3 minutes, then slowly went away on its own.  Patient states she is worried because she had a family member die of a massive heart attack in their 46s, has strong family history of heart disease in parents and grandparents.  Checked her blood pressure on 11/06/2018 and got a reading of 160/90.  Patient has also had palpitations off and on since January 2020, occur at random times, can be with exertion or when sitting.  Patient also complains of a sensation of heaviness and fatigue in her legs is been present for over a year.  Patient was seen in the emergency room for chest pain in January 2020, she had had a hysterectomy in December 2018, so blood clots were ruled out.  According to patient, she was told to follow-up with her PCP and cardiology after ER visit in January 2020, but she never followed up.  Patient Active Problem List   Diagnosis Date Noted  . Menorrhagia 08/10/2018  . Vitamin D deficiency 12/17/2017  . Iron deficiency anemia 11/13/2017  . Alopecia 11/03/2017  . Seborrheic dermatitis of scalp 11/03/2017  . Cyst of ovary 11/03/2017  . Atypical squamous cells of undetermined significance on cytologic smear of cervix (ASC-US) 11/03/2017  . Right foot pain 11/03/2017  . Thrombocytosis ([place]) 10/31/2017  . Leg edema, left 06/11/2013  . Petechiae 06/11/2013  . LGSIL (low grade squamous intraepithelial dysplasia)   . Reflux   . Hyperlipidemia 04/23/2007  . GAD (generalized anxiety disorder) 04/23/2007  . DEPRESSION 04/23/2007  . GERD 04/23/2007  . HYPERTENSION, GESTATIONAL 04/23/2007  . GESTATIONAL DIABETES 04/23/2007  . DE QUERVAIN'S  TENOSYNOVITIS 04/23/2007   Social History   Tobacco Use  . Smoking status: Former Smoker    Types: Cigarettes    Last attempt to quit: 09/02/1990    Years since quitting: 28.2  . Smokeless tobacco: Never Used  . Tobacco comment: smoked socially in high school   Substance Use Topics  . Alcohol use: Yes    Alcohol/week: 0.0 standard drinks    Comment: occasionally    Review of Systems  Constitutional: Negative for chills, fatigue and fever.  HENT: Negative for congestion, ear pain, sinus pain and sore throat.   Eyes: Negative.   Respiratory: Negative for cough, shortness of breath and wheezing.   Cardiovascular: +chest pain, palpations. Elevated BP. Gastrointestinal: Negative for abdominal pain, diarrhea, nausea and vomiting.  Genitourinary: Negative for dysuria, frequency and urgency.  Musculoskeletal: Negative for arthralgias and myalgias.  Skin: Negative for color change, pallor and rash.  Neurological: Negative for syncope, light-headedness and headaches.  Psychiatric/Behavioral: The patient is not nervous/anxious.       Objective:   Physical Exam Vitals signs and nursing note reviewed.  Constitutional:      Appearance: She is well-developed. She is not ill-appearing or diaphoretic.  HENT:     Head: Normocephalic and atraumatic.     Right Ear: Tympanic membrane, ear canal and external ear normal.     Left Ear: Tympanic membrane, ear canal and external ear normal.     Nose: Nose normal.     Mouth/Throat:  Mouth: Mucous membranes are moist.  Eyes:     General: No scleral icterus.       Right eye: No discharge.        Left eye: No discharge.     Extraocular Movements: Extraocular movements intact.     Conjunctiva/sclera: Conjunctivae normal.     Pupils: Pupils are equal, round, and reactive to light.  Neck:     Musculoskeletal: Normal range of motion and neck supple.     Vascular: No carotid bruit.     Trachea: No tracheal deviation.  Cardiovascular:     Rate  and Rhythm: Normal rate and regular rhythm.     Heart sounds: Normal heart sounds. No murmur. No friction rub. No gallop.   Pulmonary:     Effort: Pulmonary effort is normal. No respiratory distress.     Breath sounds: Normal breath sounds. No wheezing, rhonchi or rales.  Chest:     Chest wall: No tenderness.  Abdominal:     General: Bowel sounds are normal. There is no distension.     Palpations: Abdomen is soft.     Tenderness: There is no abdominal tenderness. There is no guarding or rebound.  Musculoskeletal: Normal range of motion.        General: No deformity.  Lymphadenopathy:     Cervical: No cervical adenopathy.  Skin:    General: Skin is warm and dry.     Capillary Refill: Capillary refill takes less than 2 seconds.     Coloration: Skin is not pale.     Findings: No erythema.  Neurological:     General: No focal deficit present.     Mental Status: She is alert and oriented to person, place, and time.     Cranial Nerves: No cranial nerve deficit.     Sensory: No sensory deficit.     Gait: Gait normal.  Psychiatric:        Behavior: Behavior normal.        Thought Content: Thought content normal.    Vitals:   11/10/18 1129  BP: (!) 138/98  Pulse: 72  Resp: 18  Temp: 98 F (36.7 C)  SpO2: 99%      Assessment & Plan:    A total of 40  minutes were spent face-to-face with the patient during this encounter and over half of that time was spent on counseling and coordination of care. The patient was counseled on her EKG, discussion of blood pressure, reasons why she should not wait to get checked out with having chest pain, our plan for x-ray and blood work today in office and new medication explanation.  Chest pain, palpitations, elevated blood pressure readings, family history of MI- EKG performed in clinic and reviewed by me.  Shows sinus rhythm, no acute abnormalities noted on EKG in clinic today.  Due to patient's elevated BP readings and palpitations, we will  trial low-dose Coreg 3.125 mg twice daily to see if this helps control BP, palpitations.  She will also begin 81 mg aspirin daily.  Cardiology referral placed for patient.  Patient also advised that if she experiences episode of chest pain again, to go to emergency room for evaluation rather than waiting a few days to come into clinic and be checked.  Educated patient, chest pain does not have to be extremely traumatic for it to be something concerning, and it is always a good idea to get checked out sooner rather than later.  We will also do chest x-ray  in clinic.  Sensation of heaviness in extremities-unclear reason for this.  This symptom has been present for over a year.  Blood work collected in clinic today.  Patient aware son will contact her in regards to cardiology referral.  Referral has been placed as urgent to get patient in as soon as possible.

## 2018-11-11 LAB — THYROID PANEL WITH TSH
Free Thyroxine Index: 2 (ref 1.4–3.8)
T3 Uptake: 28 % (ref 22–35)
T4, Total: 7.3 ug/dL (ref 5.1–11.9)
TSH: 1.98 m[IU]/L

## 2018-11-13 ENCOUNTER — Telehealth: Payer: Self-pay

## 2018-11-13 ENCOUNTER — Encounter: Payer: Self-pay | Admitting: Physician Assistant

## 2018-11-13 ENCOUNTER — Ambulatory Visit (INDEPENDENT_AMBULATORY_CARE_PROVIDER_SITE_OTHER): Payer: No Typology Code available for payment source | Admitting: Physician Assistant

## 2018-11-13 ENCOUNTER — Other Ambulatory Visit: Payer: Self-pay

## 2018-11-13 ENCOUNTER — Other Ambulatory Visit: Payer: Self-pay | Admitting: Physician Assistant

## 2018-11-13 VITALS — BP 130/90 | HR 65 | Ht 62.0 in | Wt 160.4 lb

## 2018-11-13 DIAGNOSIS — R079 Chest pain, unspecified: Secondary | ICD-10-CM | POA: Diagnosis not present

## 2018-11-13 DIAGNOSIS — R29898 Other symptoms and signs involving the musculoskeletal system: Secondary | ICD-10-CM

## 2018-11-13 DIAGNOSIS — R072 Precordial pain: Secondary | ICD-10-CM | POA: Diagnosis not present

## 2018-11-13 DIAGNOSIS — Z01811 Encounter for preprocedural respiratory examination: Secondary | ICD-10-CM

## 2018-11-13 DIAGNOSIS — E785 Hyperlipidemia, unspecified: Secondary | ICD-10-CM

## 2018-11-13 DIAGNOSIS — I10 Essential (primary) hypertension: Secondary | ICD-10-CM

## 2018-11-13 DIAGNOSIS — F419 Anxiety disorder, unspecified: Secondary | ICD-10-CM

## 2018-11-13 MED ORDER — METOPROLOL TARTRATE 50 MG PO TABS: ORAL_TABLET | ORAL | 0 refills | Status: DC

## 2018-11-13 MED FILL — METOPROLOL TARTRATE 50 MG T: 50 | 1 days supply | Qty: 1 | Fill #0

## 2018-11-13 NOTE — Progress Notes (Signed)
Cardiology Office Note    Date:  11/15/2018   ID:  Allure Greaser, DOB [birth year], MRN [id number]  PCP:  McLean-Scocuzza, Nino Glow, MD  Cardiologist: Dr. Debara Pickett (last visit 2014)  Chief Complaint  Patient presents with   Follow-up    seen for Dr. Debara Pickett.     History of Present Illness:  Shequita Peplinski is a 48 y.o. female with past medical history of anxiety, GERD, hypertension, hyperlipidemia and low-grade squamous intraepithelial dysplasia.  She works as a Forensic scientist at [employer].  She previously saw Dr. Verl Blalock with cardiology service in 2007 for palpitation and underwent a stress test which was apparently normal.  Patient was last seen by Dr. Debara Pickett on 06/11/2013 at which time she had left leg pain and could hardly walk.  Visible exam does not support probable DVT.  Lower extremity venous Doppler was negative.  Patient presented to the ED on 09/25/2018 for evaluation of chest pain and concerning of PE.  More recently, she had a lower extremity venous Doppler on 09/26/2018 that was negative.  She was complaining of exertional chest tightness and shortness of breath.  CT angiogram of the chest was negative for PE.  Since the ED visit, her PCP has placed her on low-dose carvedilol.  Patient presents today for an urgent cardiology office visit for evaluation of chest discomfort and upper back pain along with lower extremity weakness.  She has been having this chest discomfort and upper back discomfort with exertion for the past several months now.  She appears to be very anxious during today's interview.  I think we need a more definitive evaluation to make sure there is no significant coronary artery disease.  I will order a coronary CT.  She also complaining of lower extremity weakness as well.  Her work involves a lot of exertion and climbing up and down stairs.  Will order a lower extremity ABI to rule out significant arterial disease as well.  She has very significant family history of early CAD.   Past  Medical History:  Diagnosis Date   Anemia    Anxiety    ASCUS favoring benign 12/2015   negative high risk HPV   Chicken pox    GERD (gastroesophageal reflux disease)    during pregnancy    Headache    frequent after MCA 2018    Hyperlipidemia    Hypertension    during pregnancy    LGSIL (low grade squamous intraepithelial dysplasia)    2004-2006   Reflux     Past Surgical History:  Procedure Laterality Date   COLPOSCOPY     LAPAROSCOPIC VAGINAL HYSTERECTOMY WITH SALPINGO OOPHORECTOMY Bilateral 08/10/2018   Procedure: LAPAROSCOPIC ASSISTED VAGINAL HYSTERECTOMY WITH BILATERAL SALPINGO OOPHORECTOMY;  Surgeon: [name], MD;  Location: [place];  Service: Gynecology;  Laterality: Bilateral;   TONSILLECTOMY AND ADENOIDECTOMY      Current Medications: Outpatient Medications Prior to Visit  Medication Sig Dispense Refill   aspirin EC 81 MG tablet Take 1 tablet (81 mg total) by mouth daily. 90 tablet 1   Calcium Carb-Cholecalciferol (CALCIUM 1000 + D PO) Take 1 tablet by mouth daily.      carvedilol (COREG) 3.125 MG tablet Take 1 tablet (3.125 mg total) by mouth 2 (two) times daily with a meal. 60 tablet 3   Cholecalciferol 50000 units capsule Take 1 capsule (50,000 Units total) by mouth once a week. (Patient taking differently: Take 2,000 Units by mouth daily. ) 13 capsule 1  ibuprofen (ADVIL,MOTRIN) 800 MG tablet Take 1 tablet (800 mg total) by mouth every 8 (eight) hours as needed. (Patient not taking: Reported on 11/13/2018) 30 tablet 1   No facility-administered medications prior to visit.      Allergies:   Codeine; Erythromycin; and Levofloxacin   Social History   Socioeconomic History   Marital status: Divorced    Spouse name: Not on file   Number of children: 2   Years of education: Not on file   Highest education level: Not on file  Occupational History   Not on file  Social Needs   Financial resource strain: Not  on file   Food insecurity:    Worry: Not on file    Inability: Not on file   Transportation needs:    Medical: Not on file    Non-medical: Not on file  Tobacco Use   Smoking status: Former Smoker    Types: Cigarettes    Last attempt to quit: 09/02/1990    Years since quitting: 28.2   Smokeless tobacco: Never Used   Tobacco comment: smoked socially in high school   Substance and Sexual Activity   Alcohol use: Yes    Alcohol/week: 0.0 standard drinks    Comment: occasionally    Drug use: No   Sexual activity: Yes    Birth control/protection: None    Comment: 1st intercourse 48 yo-More than 5 partners-Vasectomy  Lifestyle   Physical activity:    Days per week: Not on file    Minutes per session: Not on file   Stress: Not on file  Relationships   Social connections:    Talks on phone: Not on file    Gets together: Not on file    Attends religious service: Not on file    Active member of club or organization: Not on file    Attends meetings of clubs or organizations: Not on file    Relationship status: Not on file  Other Topics Concern   Not on file  Social History Narrative   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[employer]    12 grade education    Wears seat belt    Safe in relationship   No guns    Divorced      Family History:  The patient's family history includes Cancer in her maternal grandfather, maternal uncle, and mother; Depression in her brother, maternal grandmother, and mother; Diabetes in her brother, cousin, and father; Heart disease in her cousin and father; Hyperlipidemia in her brother and father; Hypertension in her brother, father, and mother; Mental illness in her mother; Mental retardation in her maternal grandmother; Stroke in her father, paternal grandfather, and paternal grandmother.   ROS:   Please see the history of present illness.    ROS All other systems reviewed and are negative.   PHYSICAL EXAM:   VS:  BP 130/90    Pulse 65    Ht 5\' 2"  (1.575 m)     Wt 160 lb 6.4 oz (72.8 kg)    LMP 08/02/2018 (Exact Date)    BMI 29.34 kg/m    GEN: Well nourished, well developed, in no acute distress  HEENT: normal  Neck: no JVD, carotid bruits, or masses Cardiac: RRR; no murmurs, rubs, or gallops,no edema  Respiratory:  clear to auscultation bilaterally, normal work of breathing GI: soft, nontender, nondistended, + BS MS: no deformity or atrophy  Skin: warm and dry, no rash Neuro:  Alert and Oriented x 3, Strength and sensation are intact  Psych: euthymic mood, full affect  Wt Readings from Last 3 Encounters:  11/13/18 160 lb 6.4 oz (72.8 kg)  11/10/18 163 lb 12.8 oz (74.3 kg)  09/26/18 158 lb (71.7 kg)      Studies/Labs Reviewed:   EKG:  EKG is ordered today.  The ekg ordered today demonstrates NSR without significant ST-T wave changes  Recent Labs: 09/26/2018: Magnesium 1.9 11/10/2018: ALT 15; BUN 11; Creatinine, Ser 0.65; Hemoglobin 13.7; Platelets 352.0; Potassium 3.8; Sodium 138; TSH 1.98   Lipid Panel    Component Value Date/Time   CHOL 223 (H) 11/10/2018 1146   TRIG 140.0 11/10/2018 1146   HDL 68.00 11/10/2018 1146   CHOLHDL 3 11/10/2018 1146   VLDL 28.0 11/10/2018 1146   LDLCALC 127 (H) 11/10/2018 1146    Additional studies/ records that were reviewed today include:   N/A   ASSESSMENT:    1. Precordial chest pain   2. Chest pain, unspecified type   3. Pre-op chest exam   4. Leg weakness, bilateral   5. Anxiety   6. Essential hypertension   7. Hyperlipidemia, unspecified hyperlipidemia type      PLAN:  In order of problems listed above:  1. Precordial chest pain: Over the years, she has had multiple work-ups for recurrent DVT due to concern family history.  She has been having precordial chest pain for the past several month.  She is highly concerned given her family history.  Although her symptom is somewhat atypical and she says sometimes she feel her pain is worse with deep inspiration, I recommended a more  definitive work-up using coronary CT.  She appears to be very nervous during the entire interview and has a history of significant anxiety.  2. Hypertension: Blood pressure stable  3. Hyperlipidemia: Recent lab work shows LDL of 127, total cholesterol 223.  Continue diet and exercise.   Medication Adjustments/Labs and Tests Ordered: Current medicines are reviewed at length with the patient today.  Concerns regarding medicines are outlined above.  Medication changes, Labs and Tests ordered today are listed in the Patient Instructions below. Patient Instructions  Medication Instructions:  Your physician recommends that you continue on your current medications as directed. Please refer to the Current Medication list given to you today.  If you need a refill on your cardiac medications before your next appointment, please call your pharmacy.   Lab work: YOU WILL NEED TO RETURN TO OUR OFFICE FOR LABS (BLOOD WORK) TO BE DRAWN 1 WEEK PRIOR TO YOUR PROCEDURE  If you have labs (blood work) drawn today and your tests are completely normal, you will receive your results only by:  [place] (if you have MyChart) OR  A paper copy in the mail If you have any lab test that is abnormal or we need to change your treatment, we will call you to review the results.  Testing/Procedures: Your physician has requested that you have cardiac CT. Cardiac computed tomography (CT) is a painless test that uses an x-ray machine to take clear, detailed pictures of your heart. For further information please visit [facility]. Please follow instruction sheet as given.  Your physician has requested that you have an ankle brachial index (ABI). During this test an ultrasound and blood pressure cuff are used to evaluate the arteries that supply the arms and legs with blood. Allow thirty minutes for this exam. There are no restrictions or special instructions.   Follow-Up:  Your physician recommends that you  KEEP scheduled appointment with Dr.  Gwenlyn Found   Any Other Special Instructions Will Be Listed Below (If Applicable).     Please arrive at the [facility] main entrance of [facility] at xx:xx AM (30-45 minutes prior to test start time)  [facility] [place], [place] [address] [phone]  Proceed to the [facility] Radiology Department (First Floor).  Please follow these instructions carefully (unless otherwise directed):   On the Night Before the Test:  Be sure to Drink plenty of water.  Do not consume any caffeinated/decaffeinated beverages or chocolate 12 hours prior to your test.  Do not take any antihistamines 12 hours prior to your test.  If you take Metformin do not take 24 hours prior to test.  If the patient has contrast allergy: ? Patient will need a prescription for Prednisone and very clear instructions (as follows): 1. Prednisone 50 mg - take 13 hours prior to test 2. Take another Prednisone 50 mg 7 hours prior to test 3. Take another Prednisone 50 mg 1 hour prior to test 4. Take Benadryl 50 mg 1 hour prior to test  Patient must complete all four doses of above prophylactic medications.  Patient will need a ride after test due to Benadryl.  On the Day of the Test:  Drink plenty of water. Do not drink any water within one hour of the test.  Do not eat any food 4 hours prior to the test.  You may take your regular medications prior to the test.   Take metoprolol (Lopressor) two hours prior to test.  HOLD Furosemide/Hydrochlorothiazide morning of the test.          -Drink plenty of water       -Hold Furosemide/hydrochlorothiazide morning of the test       -Take metoprolol (Lopressor) 2 hours prior to test (if applicable).                  -If HR is less than 55 BPM- No Beta Blocker                -IF HR is greater than 55 BPM and patient is less than or equal to 8 yrs old Lopressor 100mg  x1.                -If HR  is greater than 55 BPM and patient is greater than 70 yrs old Lopressor 50 mg x1.     Do not give Lopressor to patients with an allergy to lopressor or anyone with asthma or active COPD symptoms (currently taking steroids).       After the Test:  Drink plenty of water.  After receiving IV contrast, you may experience a mild flushed feeling. This is normal.  On occasion, you may experience a mild rash up to 24 hours after the test. This is not dangerous. If this occurs, you can take Benadryl 25 mg and increase your fluid intake.  If you experience trouble breathing, this can be serious. If it is severe call 911 IMMEDIATELY. If it is mild, please call our office.  If you take any of these medications: Glipizide/Metformin, Avandament, Glucavance, please do not take 48 hours after completing test.        Ankle-Brachial Index Test Why am I having this test? The ankle-brachial index (ABI) test is used to diagnose peripheral vascular disease (PVD). PVD is also known as peripheral arterial disease (PAD). PVD is the blocking or hardening of the arteries anywhere within the circulatory  system beyond the heart. PVD is caused by:  Cholesterol deposits in your blood vessels (atherosclerosis). This is the most common cause of this condition.  Irritation and swelling (inflammation) in the blood vessels.  Blood clots in the vessels. Cholesterol deposits cause arteries to narrow. Normal delivery of oxygen to your tissues is affected, causing muscle pain and fatigue. This is called claudication. PVD means that there may also be a buildup of cholesterol:  In your heart. This increases the risk of heart attacks.  In your brain. This increases the risk of strokes. What is being tested? The ankle-brachial index test measures the blood flow in your arms and legs. The blood flow will show if blood vessels in your legs have been narrowed by cholesterol deposits. How do I prepare for this test?  Wear  loose clothing.  Do not use any tobacco products, including cigarettes, chewing tobacco, or e-cigarettes, for at least 30 minutes before the test. What happens during the test?  1. You will lie down in a resting position. 2. Your health care provider will use a blood pressure machine and a small ultrasound device (Doppler) to measure the systolic pressures on your upper arms and ankles. Systolic pressure is the pressure inside your arteries when your heart pumps. 3. Systolic pressure measurements will be taken several times, and at several points, on both the ankle and the arm. 4. Your health care provider will divide the highest systolic pressure of the ankle by the highest systolic pressure of the arm. The result is the ankle-brachial pressure ratio, or ABI. Sometimes this test will be repeated after you have exercised on a treadmill for 5 minutes. You may have leg pain during the exercise portion of the test if you suffer from PVD. If the index number drops after exercise, this may show that PVD is present. How are the results reported? Your test results will be reported as a value that shows the ratio of your ankle pressure to your arm pressure (ABI ratio). Your health care provider will compare your results to normal ranges that were established after testing a large group of people (reference ranges). Reference ranges may vary among labs and hospitals. For this test, a common reference range is:  ABI ratio of 0.9 to 1.3. What do the results mean? An ABI ratio that is below the reference range is considered abnormal and may indicate PVD in the legs. Talk with your health care provider about what your results mean. Questions to ask your health care provider Ask your health care provider, or the department that is doing the test:  When will my results be ready?  How will I get my results?  What are my treatment options?  What other tests do I need?  What are my next  steps? Summary  The ankle-brachial index (ABI) test is used to diagnose peripheral vascular disease (PVD). PVD is also known as peripheral arterial disease (PAD).  The ankle-brachial index test measures the blood flow in your arms and legs.  The highest systolic pressure of the ankle is divided by the highest systolic pressure of the arm. The result is the ABI ratio.  An ABI ratio that is below 0.9 is considered abnormal and may indicate PVD in the legs. This information is not intended to replace advice given to you by your health care provider. Make sure you discuss any questions you have with your health care provider. Document Released: 08/23/2004 Document Revised: 05/13/2017 Document Reviewed: 05/13/2017 Elsevier Interactive Patient  Education  2019 [employer].   Cardiac CT Angiogram  A cardiac CT angiogram is a procedure to look at the heart and the area around the heart. It may be done to help find the cause of chest pains or other symptoms of heart disease. During this procedure, a large X-ray machine, called a CT scanner, takes detailed pictures of the heart and the surrounding area after a dye (contrast material) has been injected into blood vessels in the area. The procedure is also sometimes called a coronary CT angiogram, coronary artery scanning, or CTA. A cardiac CT angiogram allows the health care provider to see how well blood is flowing to and from the heart. The health care provider will be able to see if there are any problems, such as:  Blockage or narrowing of the coronary arteries in the heart.  Fluid around the heart.  Signs of weakness or disease in the muscles, valves, and tissues of the heart. Tell a health care provider about:  Any allergies you have. This is especially important if you have had a previous allergic reaction to contrast dye.  All medicines you are taking, including vitamins, herbs, eye drops, creams, and over-the-counter medicines.  Any  blood disorders you have.  Any surgeries you have had.  Any medical conditions you have.  Whether you are pregnant or may be pregnant.  Any anxiety disorders, chronic pain, or other conditions you have that may increase your stress or prevent you from lying still. What are the risks? Generally, this is a safe procedure. However, problems may occur, including:  Bleeding.  Infection.  Allergic reactions to medicines or dyes.  Damage to other structures or organs.  Kidney damage from the dye or contrast that is used.  Increased risk of cancer from radiation exposure. This risk is low. Talk with your health care provider about: ? The risks and benefits of testing. ? How you can receive the lowest dose of radiation. What happens before the procedure?  Wear comfortable clothing and remove any jewelry, glasses, dentures, and hearing aids.  Follow instructions from your health care provider about eating and drinking. This may include: ? For 12 hours before the test -- avoid caffeine. This includes tea, coffee, soda, energy drinks, and diet pills. Drink plenty of water or other fluids that do not have caffeine in them. Being well-hydrated can prevent complications. ? For 4-6 hours before the test -- stop eating and drinking. The contrast dye can cause nausea, but this is less likely if your stomach is empty.  Ask your health care provider about changing or stopping your regular medicines. This is especially important if you are taking diabetes medicines, blood thinners, or medicines to treat erectile dysfunction. What happens during the procedure?  Hair on your chest may need to be removed so that small sticky patches called electrodes can be placed on your chest. These will transmit information that helps to monitor your heart during the test.  An IV tube will be inserted into one of your veins.  You might be given a medicine to control your heart rate during the test. This will help  to ensure that good images are obtained.  You will be asked to lie on an exam table. This table will slide in and out of the CT machine during the procedure.  Contrast dye will be injected into the IV tube. You might feel warm, or you may get a metallic taste in your mouth.  You will be given  a medicine (nitroglycerin) to relax (dilate) the arteries in your heart.  The table that you are lying on will move into the CT machine tunnel for the scan.  The person running the machine will give you instructions while the scans are being done. You may be asked to: ? Keep your arms above your head. ? Hold your breath. ? Stay very still, even if the table is moving.  When the scanning is complete, you will be moved out of the machine.  The IV tube will be removed. The procedure may vary among health care providers and hospitals. What happens after the procedure?  You might feel warm, or you may get a metallic taste in your mouth from the contrast dye.  You may have a headache from the nitroglycerin.  After the procedure, drink water or other fluids to wash (flush) the contrast material out of your body.  Contact a health care provider if you have any symptoms of allergy to the contrast. These symptoms include: ? Shortness of breath. ? Rash or hives. ? A racing heartbeat.  Most people can return to their normal activities right after the procedure. Ask your health care provider what activities are safe for you.  It is up to you to get the results of your procedure. Ask your health care provider, or the department that is doing the procedure, when your results will be ready. Summary  A cardiac CT angiogram is a procedure to look at the heart and the area around the heart. It may be done to help find the cause of chest pains or other symptoms of heart disease.  During this procedure, a large X-ray machine, called a CT scanner, takes detailed pictures of the heart and the surrounding area  after a dye (contrast material) has been injected into blood vessels in the area.  Ask your health care provider about changing or stopping your regular medicines before the procedure. This is especially important if you are taking diabetes medicines, blood thinners, or medicines to treat erectile dysfunction.  After the procedure, drink water or other fluids to wash (flush) the contrast material out of your body. This information is not intended to replace advice given to you by your health care provider. Make sure you discuss any questions you have with your health care provider. Document Released: 08/01/2008 Document Revised: 07/08/2016 Document Reviewed: 07/08/2016 Elsevier Interactive Patient Education  2019 [place], [place], [place]  11/15/2018 [facility] [place], [place], [place]  [address] Phone: [phone]; Fax: [phone]

## 2018-11-13 NOTE — Patient Instructions (Addendum)
Medication Instructions:  Your physician recommends that you continue on your current medications as directed. Please refer to the Current Medication list given to you today.  If you need a refill on your cardiac medications before your next appointment, please call your pharmacy.   Lab work: YOU WILL NEED TO RETURN TO OUR OFFICE FOR LABS (BLOOD WORK) TO BE DRAWN 1 WEEK PRIOR TO YOUR PROCEDURE  If you have labs (blood work) drawn today and your tests are completely normal, you will receive your results only by: Marland Kitchen MyChart Message (if you have MyChart) OR . A paper copy in the mail If you have any lab test that is abnormal or we need to change your treatment, we will call you to review the results.  Testing/Procedures: Your physician has requested that you have cardiac CT. Cardiac computed tomography (CT) is a painless test that uses an x-ray machine to take clear, detailed pictures of your heart. For further information please visit [email or web address]. Please follow instruction sheet as given.  Your physician has requested that you have an ankle brachial index (ABI). During this test an ultrasound and blood pressure cuff are used to evaluate the arteries that supply the arms and legs with blood. Allow thirty minutes for this exam. There are no restrictions or special instructions.   Follow-Up: . Your physician recommends that you KEEP scheduled appointment with Dr. Gwenlyn Found   Any Other Special Instructions Will Be Listed Below (If Applicable).     Please arrive at the [facility] main entrance of [facility] at xx:xx AM (30-45 minutes prior to test start time)  [facility] [place], [place] [address] [phone]  Proceed to the [facility] Radiology Department (First Floor).  Please follow these instructions carefully (unless otherwise directed):   On the Night Before the Test: . Be sure to Drink plenty of water. . Do not consume any  caffeinated/decaffeinated beverages or chocolate 12 hours prior to your test. . Do not take any antihistamines 12 hours prior to your test. . If you take Metformin do not take 24 hours prior to test. . If the patient has contrast allergy: ? Patient will need a prescription for Prednisone and very clear instructions (as follows): 1. Prednisone 50 mg - take 13 hours prior to test 2. Take another Prednisone 50 mg 7 hours prior to test 3. Take another Prednisone 50 mg 1 hour prior to test 4. Take Benadryl 50 mg 1 hour prior to test . Patient must complete all four doses of above prophylactic medications. . Patient will need a ride after test due to Benadryl.  On the Day of the Test: . Drink plenty of water. Do not drink any water within one hour of the test. . Do not eat any food 4 hours prior to the test. . You may take your regular medications prior to the test.  . Take metoprolol (Lopressor) two hours prior to test. . HOLD Furosemide/Hydrochlorothiazide morning of the test.          -Drink plenty of water       -Hold Furosemide/hydrochlorothiazide morning of the test       -Take metoprolol (Lopressor) 2 hours prior to test (if applicable).                  -If HR is less than 55 BPM- No Beta Blocker                -IF HR is  greater than 55 BPM and patient is less than or equal to 80 yrs old Lopressor 100mg  x1.                -If HR is greater than 55 BPM and patient is greater than 56 yrs old Lopressor 50 mg x1.     Do not give Lopressor to patients with an allergy to lopressor or anyone with asthma or active COPD symptoms (currently taking steroids).       After the Test: . Drink plenty of water. . After receiving IV contrast, you may experience a mild flushed feeling. This is normal. . On occasion, you may experience a mild rash up to 24 hours after the test. This is not dangerous. If this occurs, you can take Benadryl 25 mg and increase your fluid intake. . If you experience  trouble breathing, this can be serious. If it is severe call 911 IMMEDIATELY. If it is mild, please call our office. . If you take any of these medications: Glipizide/Metformin, Avandament, Glucavance, please do not take 48 hours after completing test.        Ankle-Brachial Index Test Why am I having this test? The ankle-brachial index (ABI) test is used to diagnose peripheral vascular disease (PVD). PVD is also known as peripheral arterial disease (PAD). PVD is the blocking or hardening of the arteries anywhere within the circulatory system beyond the heart. PVD is caused by:  Cholesterol deposits in your blood vessels (atherosclerosis). This is the most common cause of this condition.  Irritation and swelling (inflammation) in the blood vessels.  Blood clots in the vessels. Cholesterol deposits cause arteries to narrow. Normal delivery of oxygen to your tissues is affected, causing muscle pain and fatigue. This is called claudication. PVD means that there may also be a buildup of cholesterol:  In your heart. This increases the risk of heart attacks.  In your brain. This increases the risk of strokes. What is being tested? The ankle-brachial index test measures the blood flow in your arms and legs. The blood flow will show if blood vessels in your legs have been narrowed by cholesterol deposits. How do I prepare for this test?  Wear loose clothing.  Do not use any tobacco products, including cigarettes, chewing tobacco, or e-cigarettes, for at least 30 minutes before the test. What happens during the test?  1. You will lie down in a resting position. 2. Your health care provider will use a blood pressure machine and a small ultrasound device (Doppler) to measure the systolic pressures on your upper arms and ankles. Systolic pressure is the pressure inside your arteries when your heart pumps. 3. Systolic pressure measurements will be taken several times, and at several points, on  both the ankle and the arm. 4. Your health care provider will divide the highest systolic pressure of the ankle by the highest systolic pressure of the arm. The result is the ankle-brachial pressure ratio, or ABI. Sometimes this test will be repeated after you have exercised on a treadmill for 5 minutes. You may have leg pain during the exercise portion of the test if you suffer from PVD. If the index number drops after exercise, this may show that PVD is present. How are the results reported? Your test results will be reported as a value that shows the ratio of your ankle pressure to your arm pressure (ABI ratio). Your health care provider will compare your results to normal ranges that were established after testing a large  group of people (reference ranges). Reference ranges may vary among labs and hospitals. For this test, a common reference range is:  ABI ratio of 0.9 to 1.3. What do the results mean? An ABI ratio that is below the reference range is considered abnormal and may indicate PVD in the legs. Talk with your health care provider about what your results mean. Questions to ask your health care provider Ask your health care provider, or the department that is doing the test:  When will my results be ready?  How will I get my results?  What are my treatment options?  What other tests do I need?  What are my next steps? Summary  The ankle-brachial index (ABI) test is used to diagnose peripheral vascular disease (PVD). PVD is also known as peripheral arterial disease (PAD).  The ankle-brachial index test measures the blood flow in your arms and legs.  The highest systolic pressure of the ankle is divided by the highest systolic pressure of the arm. The result is the ABI ratio.  An ABI ratio that is below 0.9 is considered abnormal and may indicate PVD in the legs. This information is not intended to replace advice given to you by your health care provider. Make sure you  discuss any questions you have with your health care provider. Document Released: 08/23/2004 Document Revised: 05/13/2017 Document Reviewed: 05/13/2017 Elsevier Interactive Patient Education  2019 [place].   Cardiac CT Angiogram  A cardiac CT angiogram is a procedure to look at the heart and the area around the heart. It may be done to help find the cause of chest pains or other symptoms of heart disease. During this procedure, a large X-ray machine, called a CT scanner, takes detailed pictures of the heart and the surrounding area after a dye (contrast material) has been injected into blood vessels in the area. The procedure is also sometimes called a coronary CT angiogram, coronary artery scanning, or CTA. A cardiac CT angiogram allows the health care provider to see how well blood is flowing to and from the heart. The health care provider will be able to see if there are any problems, such as:  Blockage or narrowing of the coronary arteries in the heart.  Fluid around the heart.  Signs of weakness or disease in the muscles, valves, and tissues of the heart. Tell a health care provider about:  Any allergies you have. This is especially important if you have had a previous allergic reaction to contrast dye.  All medicines you are taking, including vitamins, herbs, eye drops, creams, and over-the-counter medicines.  Any blood disorders you have.  Any surgeries you have had.  Any medical conditions you have.  Whether you are pregnant or may be pregnant.  Any anxiety disorders, chronic pain, or other conditions you have that may increase your stress or prevent you from lying still. What are the risks? Generally, this is a safe procedure. However, problems may occur, including:  Bleeding.  Infection.  Allergic reactions to medicines or dyes.  Damage to other structures or organs.  Kidney damage from the dye or contrast that is used.  Increased risk of cancer from  radiation exposure. This risk is low. Talk with your health care provider about: ? The risks and benefits of testing. ? How you can receive the lowest dose of radiation. What happens before the procedure?  Wear comfortable clothing and remove any jewelry, glasses, dentures, and hearing aids.  Follow instructions from your health care provider  about eating and drinking. This may include: ? For 12 hours before the test - avoid caffeine. This includes tea, coffee, soda, energy drinks, and diet pills. Drink plenty of water or other fluids that do not have caffeine in them. Being well-hydrated can prevent complications. ? For 4-6 hours before the test - stop eating and drinking. The contrast dye can cause nausea, but this is less likely if your stomach is empty.  Ask your health care provider about changing or stopping your regular medicines. This is especially important if you are taking diabetes medicines, blood thinners, or medicines to treat erectile dysfunction. What happens during the procedure?  Hair on your chest may need to be removed so that small sticky patches called electrodes can be placed on your chest. These will transmit information that helps to monitor your heart during the test.  An IV tube will be inserted into one of your veins.  You might be given a medicine to control your heart rate during the test. This will help to ensure that good images are obtained.  You will be asked to lie on an exam table. This table will slide in and out of the CT machine during the procedure.  Contrast dye will be injected into the IV tube. You might feel warm, or you may get a metallic taste in your mouth.  You will be given a medicine (nitroglycerin) to relax (dilate) the arteries in your heart.  The table that you are lying on will move into the CT machine tunnel for the scan.  The person running the machine will give you instructions while the scans are being done. You may be asked  to: ? Keep your arms above your head. ? Hold your breath. ? Stay very still, even if the table is moving.  When the scanning is complete, you will be moved out of the machine.  The IV tube will be removed. The procedure may vary among health care providers and hospitals. What happens after the procedure?  You might feel warm, or you may get a metallic taste in your mouth from the contrast dye.  You may have a headache from the nitroglycerin.  After the procedure, drink water or other fluids to wash (flush) the contrast material out of your body.  Contact a health care provider if you have any symptoms of allergy to the contrast. These symptoms include: ? Shortness of breath. ? Rash or hives. ? A racing heartbeat.  Most people can return to their normal activities right after the procedure. Ask your health care provider what activities are safe for you.  It is up to you to get the results of your procedure. Ask your health care provider, or the department that is doing the procedure, when your results will be ready. Summary  A cardiac CT angiogram is a procedure to look at the heart and the area around the heart. It may be done to help find the cause of chest pains or other symptoms of heart disease.  During this procedure, a large X-ray machine, called a CT scanner, takes detailed pictures of the heart and the surrounding area after a dye (contrast material) has been injected into blood vessels in the area.  Ask your health care provider about changing or stopping your regular medicines before the procedure. This is especially important if you are taking diabetes medicines, blood thinners, or medicines to treat erectile dysfunction.  After the procedure, drink water or other fluids to wash (flush) the  contrast material out of your body. This information is not intended to replace advice given to you by your health care provider. Make sure you discuss any questions you have with your  health care provider. Document Released: 08/01/2008 Document Revised: 07/08/2016 Document Reviewed: 07/08/2016 Elsevier Interactive Patient Education  2019 [employer].

## 2018-11-13 NOTE — Telephone Encounter (Signed)
Incoming call from new pt to triage nurse. 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 Pt c/o pain and tightness to back and shoulder, generalized fatigue/tiredness, and progressive sensation of heaviness to BLE, especially when ambulating, and now to BUE. She denies edema to extremities. She denies current CP. She states that last episode of CP was Sunday 3/8, lasted for a few minutes, and would come and go. She states that CP appears to present when active and pain is felt at 'center of chest, near the left side'. Pt states her BP has been running high as well. No specifics provided. Consulted Almyra Deforest, [place] and pt scheduled for appt 3/13 at 1130

## 2018-11-15 ENCOUNTER — Encounter: Payer: Self-pay | Admitting: Physician Assistant

## 2018-11-16 ENCOUNTER — Telehealth (HOSPITAL_COMMUNITY): Payer: Self-pay | Admitting: Emergency Medicine

## 2018-11-16 NOTE — Telephone Encounter (Signed)
Pt returning phone call regarding upcoming cardiac imaging study; pt verbalizes understanding of appt date/time, parking situation and where to check in, pre-test NPO status and medications ordered, and verified current allergies; name and call back number provided for further questions should they arise Marchia Bond RN Navigator Cardiac Imaging [facility] [phone] office [phone] cell.  Pt states she was recently put on coreg and also given rx for 50mg  metoprolol to take prior to scan. Her HR was 65. I told her to take half metoprolol (25mg  metoprolol prior to scan).

## 2018-11-16 NOTE — Telephone Encounter (Signed)
Left message on voicemail with name and callback number Corderro Koloski RN Navigator Cardiac Imaging [facility] [phone] Office [phone] Cell  

## 2018-11-17 ENCOUNTER — Encounter (HOSPITAL_COMMUNITY): Payer: Self-pay

## 2018-11-17 ENCOUNTER — Ambulatory Visit (HOSPITAL_COMMUNITY)
Admission: RE | Admit: 2018-11-17 | Discharge: 2018-11-17 | Disposition: A | Discharge status: Home / Self Care | Payer: No Typology Code available for payment source | Source: Ambulatory Visit | Attending: Physician Assistant | Admitting: Physician Assistant

## 2018-11-17 ENCOUNTER — Other Ambulatory Visit: Payer: Self-pay

## 2018-11-17 ENCOUNTER — Ambulatory Visit (HOSPITAL_COMMUNITY)
Admission: RE | Admit: 2018-11-17 | Discharge: 2018-11-17 | Disposition: A | Discharge status: Home / Self Care | Payer: No Typology Code available for payment source | Source: Ambulatory Visit | Attending: Cardiology | Admitting: Cardiology

## 2018-11-17 DIAGNOSIS — Z01811 Encounter for preprocedural respiratory examination: Secondary | ICD-10-CM | POA: Diagnosis present

## 2018-11-17 DIAGNOSIS — Z006 Encounter for examination for normal comparison and control in clinical research program: Secondary | ICD-10-CM

## 2018-11-17 DIAGNOSIS — R29898 Other symptoms and signs involving the musculoskeletal system: Secondary | ICD-10-CM | POA: Insufficient documentation

## 2018-11-17 DIAGNOSIS — R072 Precordial pain: Secondary | ICD-10-CM | POA: Insufficient documentation

## 2018-11-17 DIAGNOSIS — R079 Chest pain, unspecified: Secondary | ICD-10-CM

## 2018-11-17 MED ORDER — NITROGLYCERIN 0.4 MG SL SUBL
SUBLINGUAL_TABLET | SUBLINGUAL | Status: AC
  Filled 2018-11-17: qty 2

## 2018-11-17 MED ORDER — IOHEXOL 350 MG/ML SOLN
90.0000 mL | Freq: Once | INTRAVENOUS | Status: AC | PRN
  Administered 2018-11-17: 90 mL via INTRAVENOUS

## 2018-11-17 MED ORDER — NITROGLYCERIN 0.4 MG SL SUBL
0.8000 mg | SUBLINGUAL_TABLET | Freq: Once | SUBLINGUAL | Status: AC
  Administered 2018-11-17: 0.8 mg via SUBLINGUAL
  Filled 2018-11-17: qty 25

## 2018-11-17 NOTE — Research (Signed)
[name] met inclusion and exclusion criteria.  The informed consent form, study requirements and expectations were reviewed with the subject and questions and concerns were addressed prior to the signing of the consent form.  The subject verbalized understanding of the trial requirements.  The subject agreed to participate in the CADFEM trial and signed the informed consent.  The informed consent was obtained prior to performance of any protocol-specific procedures for the subject.  A copy of the signed informed consent was given to the subject and a copy was placed in the subject's medical record.   [name]. [employer]

## 2018-11-23 ENCOUNTER — Telehealth: Payer: Self-pay | Admitting: Physician Assistant

## 2018-11-23 NOTE — Telephone Encounter (Signed)
   Primary Cardiologist:  Pixie Casino, MD   Patient contacted.  History reviewed.  No symptoms to suggest any unstable cardiac conditions.  Based on discussion, with current pandemic situation, we will be postponing this appointment for [name].  If symptoms change, she has been instructed to contact our office.   Routing to C19 CANCEL pool for tracking (P CV DIV CV19 CANCEL) and assigning priority (1 = 4-6 wks).  [place], [place]  11/23/2018 4:29 PM         .

## 2018-11-24 ENCOUNTER — Ambulatory Visit: Payer: No Typology Code available for payment source | Admitting: Cardiovascular Disease

## 2018-11-24 ENCOUNTER — Encounter

## 2018-11-25 ENCOUNTER — Telehealth: Payer: Self-pay | Admitting: Internal Medicine

## 2018-11-25 NOTE — Telephone Encounter (Signed)
Patient has been informed.

## 2018-11-25 NOTE — Telephone Encounter (Signed)
Copied from [place] [phone]. Topic: General - Other >> Nov 25, 2018  9:04 AM Lennox Solders wrote: Reason for CRM: pt left message on refill voicemail and she would like to have rx for vit d 50000 IU send to [facility]. Pt is concern about corona virus

## 2018-11-25 NOTE — Telephone Encounter (Signed)
She can buy over the counter D3 5000 Iu daily  [place]

## 2018-12-07 ENCOUNTER — Telehealth: Payer: Self-pay | Admitting: Physician Assistant

## 2018-12-07 NOTE — Telephone Encounter (Signed)
   TELEPHONE CALL NOTE - RESCHEDULING OPV CANCELLATIONS DUE TO COVID-19  Primary Cardiologist:Kenneth C Hilty, MD  This patient has been deemed a candidate for a follow-up tele-health visit to limit community exposure during the Covid-19 pandemic. I spoke with the patient via phone 12/07/2018 and the patient is agreeable to a telehealth visit. I have sent instructions to the patient's MyChart (dotphrase [email or web address]). If the patient does not have an active MyChart account, I have offered to send a link so the patient can set this up. The patient is aware they will receive a phone call 2-3 days prior to their E-Visit, at which time consent will be verbally confirmed.  Please schedule a "VIDEO" visit in the next two weeks with Dr. Debara Pickett.  I will forward this appointment request to the appropriate scheduling pool and the primary cardiologist's assist. I will remove this patient from the C19 cancellation pool.   Tami Lin Adisyn Ruscitti, PA 12/07/2018, 9:03 AM

## 2018-12-09 ENCOUNTER — Other Ambulatory Visit: Payer: Self-pay

## 2018-12-11 ENCOUNTER — Telehealth (INDEPENDENT_AMBULATORY_CARE_PROVIDER_SITE_OTHER): Payer: No Typology Code available for payment source | Admitting: Internal Medicine

## 2018-12-11 ENCOUNTER — Encounter: Payer: Self-pay | Admitting: Internal Medicine

## 2018-12-11 VITALS — Ht 62.0 in | Wt 159.0 lb

## 2018-12-11 DIAGNOSIS — Z7189 Other specified counseling: Secondary | ICD-10-CM

## 2018-12-11 DIAGNOSIS — R072 Precordial pain: Secondary | ICD-10-CM

## 2018-12-11 DIAGNOSIS — E785 Hyperlipidemia, unspecified: Secondary | ICD-10-CM | POA: Diagnosis not present

## 2018-12-11 DIAGNOSIS — R030 Elevated blood-pressure reading, without diagnosis of hypertension: Secondary | ICD-10-CM

## 2018-12-11 DIAGNOSIS — I10 Essential (primary) hypertension: Secondary | ICD-10-CM

## 2018-12-11 NOTE — Progress Notes (Signed)
Virtual Visit via Video Note   This visit type was conducted due to national recommendations for restrictions regarding the COVID-19 Pandemic (e.g. social distancing) in an effort to limit this patient's exposure and mitigate transmission in our community.  Due to her co-morbid illnesses, this patient is at least at moderate risk for complications without adequate follow up.  This format is felt to be most appropriate for this patient at this time.  All issues noted in this document were discussed and addressed.  A limited physical exam was performed with this format.  Please refer to the patient's chart for her consent to telehealth for [facility].   Evaluation Performed: follow-up  Date:  12/11/2018   ID:  [name], DOB 1971/03/14, MRN [id number]  Patient Location:  [place] [address]  Provider location:   [address], [address] [place], [place] [address]  PCP:  McLean-Scocuzza, Nino Glow, MD  Cardiologist:  Pixie Casino, MD Electrophysiologist:  None   Chief Complaint:  Follow-up chest pain  History of Present Illness:    [name] is a 48 y.o. female who presents via audio/video conferencing for a telehealth visit today.  [name] is seen today in follow-up. I had last seen her in 2019 for leg swelling - recently she had chest pain - more upper back and shoulder blade, constant, worse with deep breathing. She was seen in the ER and had a negative CT for PE. Subsequently, she saw Almyra Deforest, PA-C in the office. He ordered a CT coronary angiogram which was performed on 11/17/2018 - this demonstrated 0 (zero) coronary calcium, however, there was a small amount of non-calcified plaque in the LAD (10-20%) - aggressive risk factor modification was recommended. She had recent labs, including a lipid profile which showed an LDL of 223, HDL of 68 and trigs of 140. I reviewed these results with her today and my recommendations for aggressive dietary  intervention and possible lipid lowering therapy to target LDL of <70.  The patient does not have symptoms concerning for COVID-19 infection (fever, chills, cough, or new SHORTNESS OF BREATH).    Prior CV studies:   The following studies were reviewed today:  CT coronary angiogram Recent lipid profile  PMHx:  Past Medical History:  Diagnosis Date  . Anemia   . Anxiety   . ASCUS favoring benign 12/2015   negative high risk HPV  . Chicken pox   . GERD (gastroesophageal reflux disease)    during pregnancy   . 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 Hyperlipidemia   . Hypertension    during pregnancy   . 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 Reflux     Past Surgical History:  Procedure Laterality Date  . COLPOSCOPY    . LAPAROSCOPIC VAGINAL HYSTERECTOMY WITH SALPINGO OOPHORECTOMY Bilateral 08/10/2018   Procedure: LAPAROSCOPIC ASSISTED VAGINAL HYSTERECTOMY WITH BILATERAL SALPINGO OOPHORECTOMY;  Surgeon: [name], MD;  Location: [facility];  Service: Gynecology;  Laterality: Bilateral;  . TONSILLECTOMY AND ADENOIDECTOMY      FAMHx:  Family History  Problem Relation Age of Onset  . Hypertension Mother   . Cancer Mother        LUNG CANCER/SMOKER  . Depression Mother   . Mental illness Mother   . Diabetes Father   . Hypertension Father   . Heart disease Father   . Hyperlipidemia Father   . Stroke Father   .  Hypertension Brother   . Diabetes Brother   . Depression Brother   . Hyperlipidemia Brother   . Cancer Maternal Uncle        ESOPHAGEAL CA  . Diabetes Cousin   . Heart disease Cousin   . Cancer Maternal Grandfather        Brain  . Stroke Paternal Grandmother   . Depression Maternal Grandmother   . Mental retardation Maternal Grandmother   . Stroke Paternal Grandfather     SOCHx:   reports that she quit smoking about 28 years ago. Her smoking use included cigarettes. She has never used smokeless  tobacco. She reports current alcohol use. She reports that she does not use drugs.  ALLERGIES:  Allergies  Allergen Reactions  . Codeine Shortness Of Breath    Breathing problems  - liquid med   . Erythromycin     GI upset   . Levofloxacin     Heart racing     MEDS:  Current Meds  Medication Sig  . Calcium Carb-Cholecalciferol (CALCIUM 1000 + D PO) Take 1 tablet by mouth daily.   . carvedilol (COREG) 3.125 MG tablet Take 1 tablet (3.125 mg total) by mouth 2 (two) times daily with a meal.  . Cholecalciferol 50000 units capsule Take 1 capsule (50,000 Units total) by mouth once a week.     ROS: Pertinent items noted in HPI and remainder of comprehensive ROS otherwise negative.  Labs/Other Tests and Data Reviewed:    Recent Labs: 09/26/2018: Magnesium 1.9 11/10/2018: ALT 15; BUN 11; Creatinine, Ser 0.65; Hemoglobin 13.7; Platelets 352.0; Potassium 3.8; Sodium 138; TSH 1.98   Recent Lipid Panel Lab Results  Component Value Date/Time   CHOL 223 (H) 11/10/2018 11:46 AM   TRIG 140.0 11/10/2018 11:46 AM   HDL 68.00 11/10/2018 11:46 AM   CHOLHDL 3 11/10/2018 11:46 AM   LDLCALC 127 (H) 11/10/2018 11:46 AM    Wt Readings from Last 3 Encounters:  12/11/18 159 lb (72.1 kg)  11/13/18 160 lb 6.4 oz (72.8 kg)  11/10/18 163 lb 12.8 oz (74.3 kg)     Exam:    Vital Signs:  Ht 5\' 2"  (1.575 m)   Wt 159 lb (72.1 kg)   LMP 08/02/2018 (Exact Date)   BMI 29.08 kg/m    General appearance: alert and no distress Lungs: no visible difficulty breathing Extremities: extremities normal, atraumatic, no cyanosis or edema Skin: Skin color, texture, turgor normal. No rashes or lesions Neurologic: Mental status: Alert, oriented, thought content appropriate Psych: Pleasant, mildly anxious  ASSESSMENT & PLAN:    1. Pleuritic chest and upper back pain 2. Low risk cardiac CT with CAC of 0, mild non-calcified plaque of the LAD 3. Dyslipidemia 4. Hypertension  [name] had a low risk cardiac  CT with zero coronary calcium, however, there was some non-calcified plaque in the LAD. Given her young age, this represents premature onset CAD and would warrant aggressive preventative measures. I have recommended dietary changes along with statin therapy. She is hesitant about starting a statin. She reports her diet has been less than ideal over the past year - she will work on aggressively lowering saturated fats and increase exercise over the next 6 months. We will plan to repeat a lipid profile at that time. Her goal LDL is <70. She was started on low dose coreg for elevated BP - I think this is a good medicine to stay on if her BP has normalized. She was advised to purchase a  BP cuff to monitor her BP. Finally, she asked about whether she should still take aspirin - at this point, I advised her against it given her low risk scan, however, we may consider it later when she is older or depending on follow-up studies - which may be a repeat calcium score in 10 years.  Follow-up with me in 6 months.  COVID-19 Education: The signs and symptoms of COVID-19 were discussed with the patient and how to seek care for testing (follow up with PCP or arrange E-visit).  The importance of social distancing was discussed today.  Patient Risk:   After full review of this patients clinical status, I feel that they are at least moderate risk at this time.  Time:   Today, I have spent 31 minutes with the patient with telehealth technology discussing her CT results, medications, cholesterol profile, healthy diet and exercise changes, aspirin use, COVID-19 preventative measures     Medication Adjustments/Labs and Tests Ordered: Current medicines are reviewed at length with the patient today.  Concerns regarding medicines are outlined above.   Tests Ordered: Orders Placed This Encounter  Procedures  . Lipid panel    Medication Changes: No orders of the defined types were placed in this encounter.    Disposition:  in 6 month(s) in lipid clinic  Pixie Casino, MD, [facility], [place] Director of the Advanced Lipid Disorders &  Cardiovascular Risk Reduction Clinic Diplomate of the American Board of Clinical Lipidology Attending Cardiologist  Direct Dial: [phone]  Fax: [phone]  Website:  [email or web address]  Pixie Casino, MD  12/11/2018 8:56 AM

## 2018-12-11 NOTE — Patient Instructions (Signed)
Medication Instructions:  Stop: Aspirin 81 mg  Lab work: Your physician recommends that you return for lab work in 6 months prior to appointment (lipid)   Testing/Procedures: None  Follow-Up: Your physician recommends that you schedule a follow-up appointment in 6 months with Dr. Debara Pickett.

## 2018-12-20 IMAGING — MG DIGITAL DIAGNOSTIC BILATERAL MAMMOGRAM WITH TOMO AND CAD
8 series · 8 of 24 positions shown · non-contrast
Comparison: Previous exam(s).

ACR Breast Density Category a: The breast tissue is almost entirely
fatty.

CLINICAL DATA: Follow-up skin redness in the 7-8 o'clock position
of the left breast, treated with steroid cream. The redness has
subsequently resolved. She also reports that her left breast pain
has resolved. She describes diffuse firmness of the right breast
with no focal palpable abnormality.

EXAM:
DIGITAL DIAGNOSTIC BILATERAL MAMMOGRAM WITH CAD AND TOMO

[L CC synth-2D]
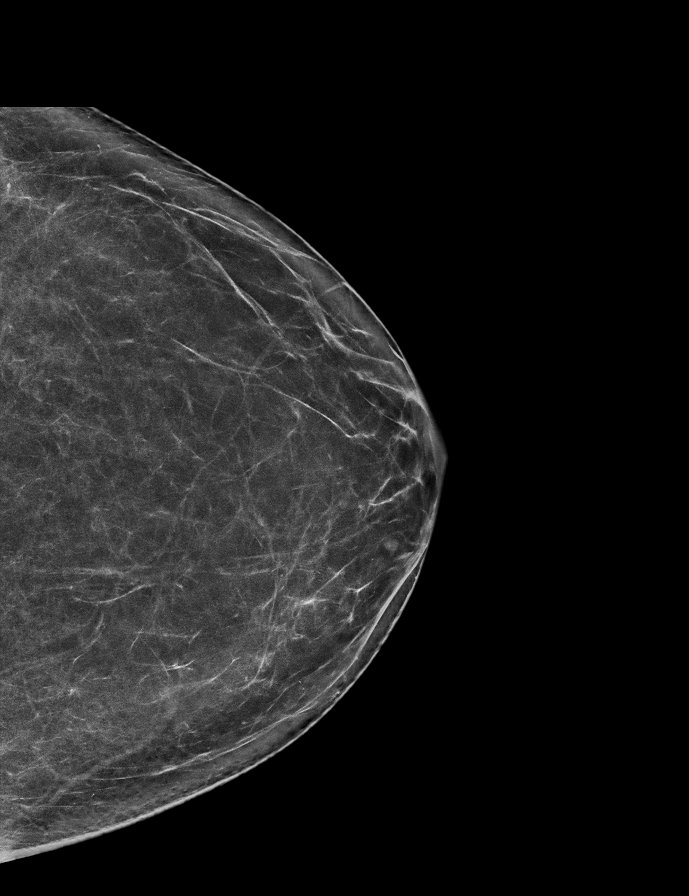

[R MLO synth-2D]
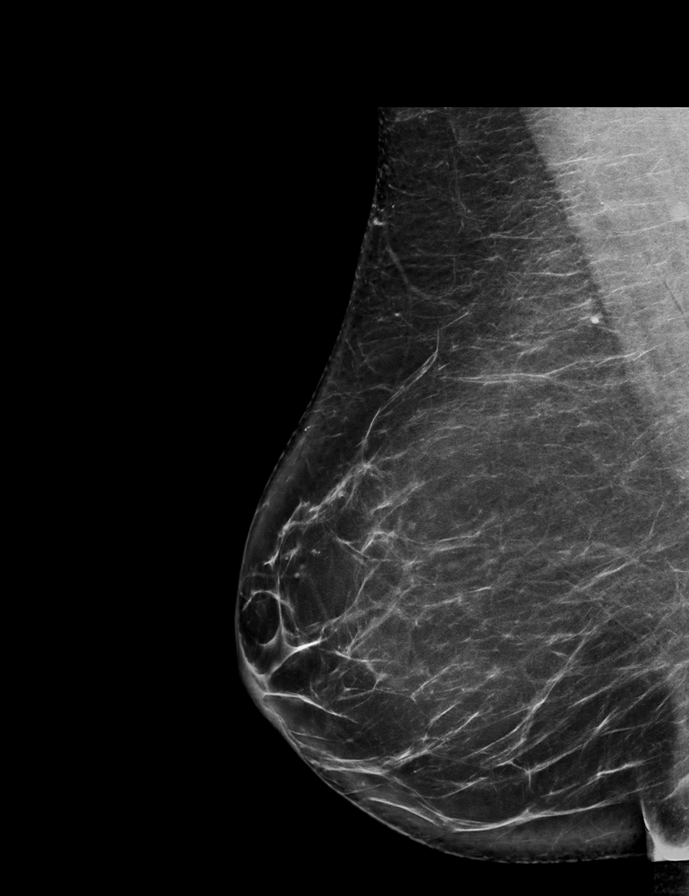

[L MLO synth-2D]
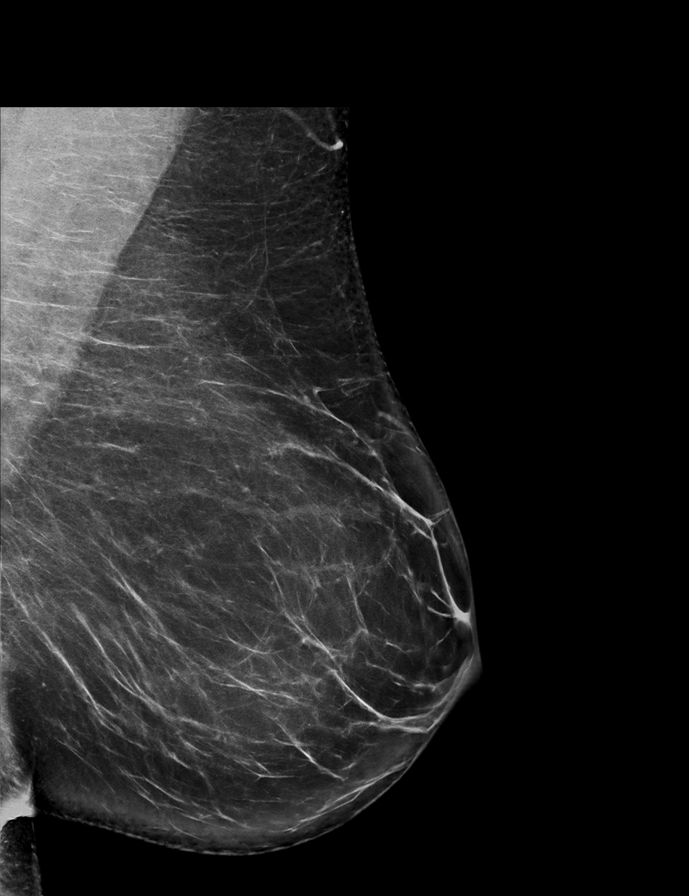

[R CC synth-2D]
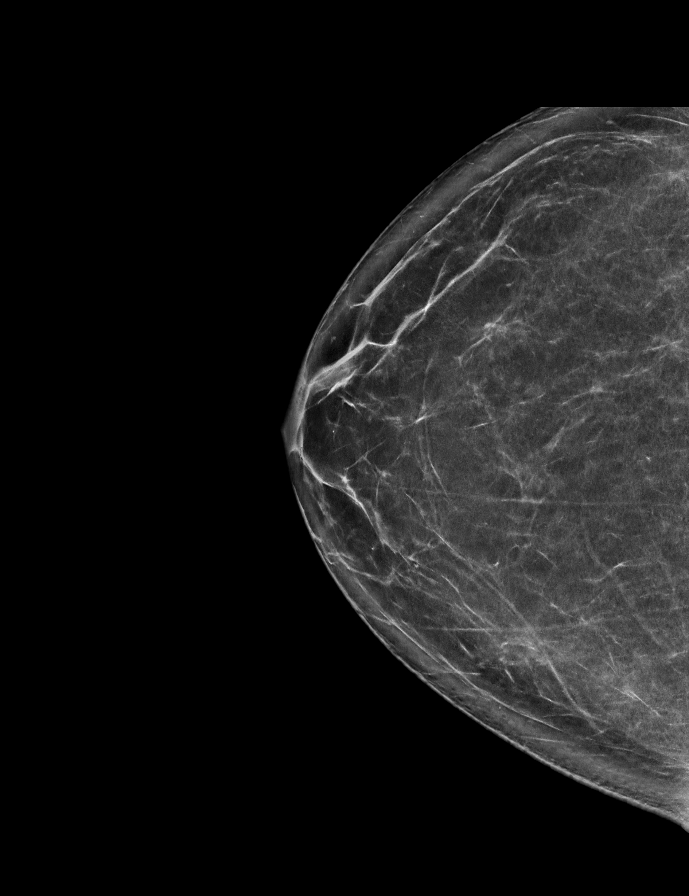

[L CC tomo · tomo slice 35/70.0]
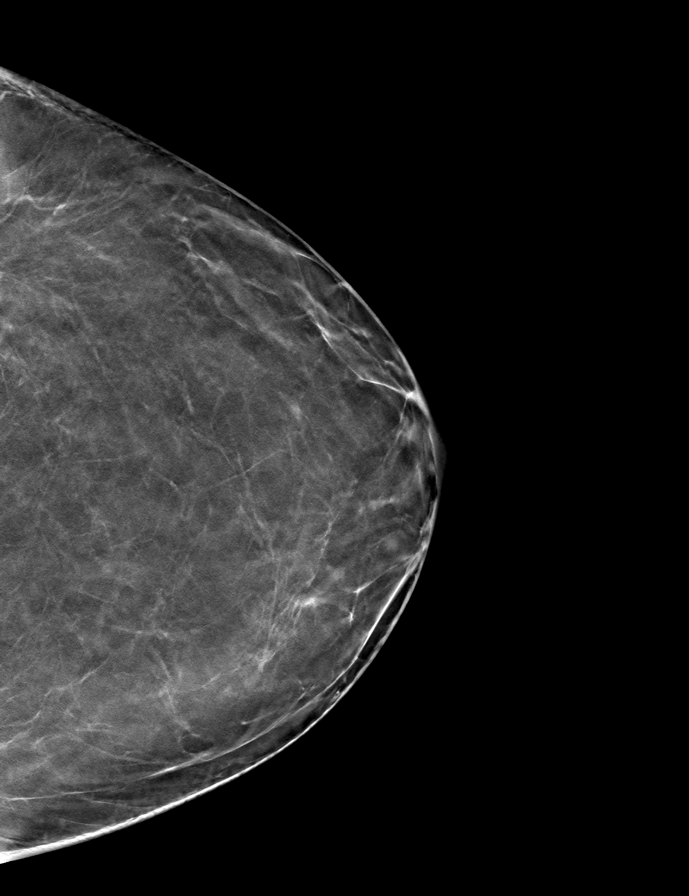

[L MLO tomo · tomo slice 44/87.0]
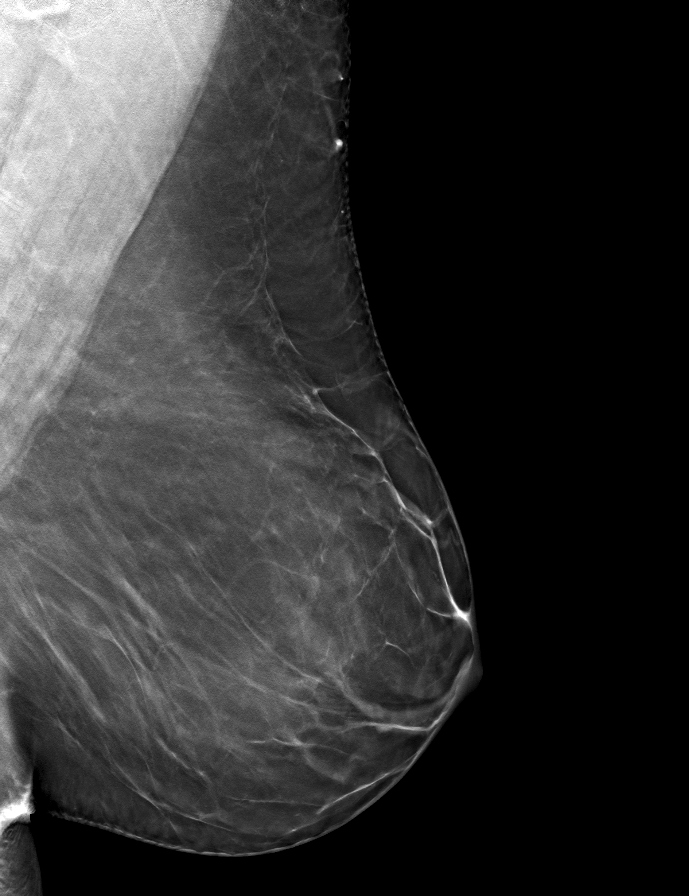

[R CC tomo · tomo slice 37/73.0]
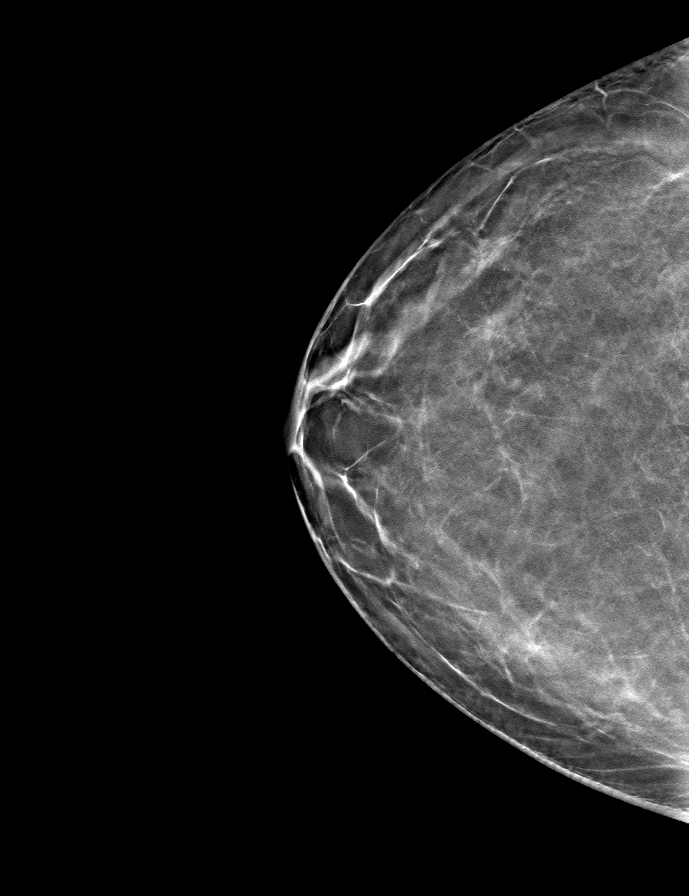

[R MLO tomo · tomo slice 44/87.0]
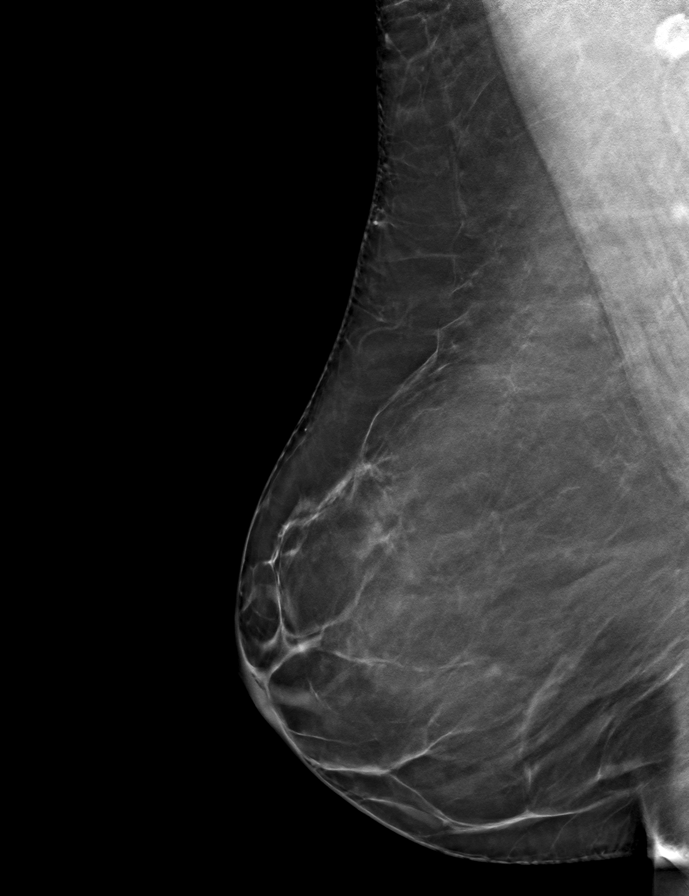

[8 of 24 positions shown; findings below may reference images not displayed]

FINDINGS: Stable mammographic appearance of the breasts with no findings
suspicious for malignancy in either breast.

On physical examination today, the area of skin redness seen in the
7-8 o'clock position of the left breast on 10/03/2015 is no longer
demonstrated. The patient has approximately 5 scattered, small,
raised red skin bumps in the upper inner periareolar region and 2 in
the lower inner periareolar region of the left breast. There are no
palpable masses. No mass or abnormal firmness is palpable in the
right breast.

Mammographic images were processed with CAD.
IMPRESSION: No evidence of malignancy.

RECOMMENDATION:
Bilateral screening mammogram in 1 year.

I have discussed the findings and recommendations with the patient.
Results were also provided in writing at the conclusion of the
visit. If applicable, a reminder letter will be sent to the patient
regarding the next appointment.

BI-RADS CATEGORY  1: Negative.

## 2019-03-08 ENCOUNTER — Other Ambulatory Visit: Payer: Self-pay

## 2019-03-09 ENCOUNTER — Encounter: Payer: Self-pay | Admitting: Gynecology

## 2019-03-09 ENCOUNTER — Ambulatory Visit (INDEPENDENT_AMBULATORY_CARE_PROVIDER_SITE_OTHER): Payer: No Typology Code available for payment source | Admitting: Gynecology

## 2019-03-09 VITALS — BP 114/70

## 2019-03-09 DIAGNOSIS — N939 Abnormal uterine and vaginal bleeding, unspecified: Secondary | ICD-10-CM | POA: Diagnosis not present

## 2019-03-09 DIAGNOSIS — L929 Granulomatous disorder of the skin and subcutaneous tissue, unspecified: Secondary | ICD-10-CM | POA: Diagnosis not present

## 2019-03-09 DIAGNOSIS — N898 Other specified noninflammatory disorders of vagina: Secondary | ICD-10-CM | POA: Diagnosis not present

## 2019-03-09 DIAGNOSIS — R102 Pelvic and perineal pain: Secondary | ICD-10-CM | POA: Diagnosis not present

## 2019-03-09 NOTE — Addendum Note (Signed)
Addended by: Nelva Nay on: 03/09/2019 02:54 PM   Modules accepted: Orders

## 2019-03-09 NOTE — Progress Notes (Signed)
    [name] 02/27/1971 [id number]        48 y.o.  G2P2002 presents complaining of vaginal spotting with wiping.  Having some suprapubic pressure feelings.  Notices some loss of urine with coughing and sneezing.  Notes while playing Frisbee she jumped up and felt something "pop" in the pelvic region and since then has had some issues with loss of urine.  Status post LAVH BSO 08/2018  Past medical history,surgical history, problem list, medications, allergies, family history and social history were all reviewed and documented in the EPIC chart.  Directed ROS with pertinent positives and negatives documented in the history of present illness/assessment and plan.  Exam: [name] assistant Vitals:   03/09/19 1235  BP: 114/70   General appearance:  Normal Abdomen soft nontender without masses guarding rebound Pelvic external BUS vagina with area of classic appearing granulation tissue at the vaginal cuff.  The tissue was biopsied off and silver nitrate applied afterwards.  Bimanual without masses or tenderness.  Assessment/Plan:  48 y.o. [id number] with:  1. Classic granulation tissue vaginal cuff accounting for her spotting.  The area was biopsied off with silver nitrate applied afterwards.  The patient asked if I would send to pathology.  I reassured her that this was classic in appearance but she would feel more comfortable if it was sent to pathology. 2. Suprapubic pressure.  SUI symptoms.  Urine analysis is unremarkable.  We reviewed stress incontinence and recommended that the patient start with Kegel exercises consistently.  If the symptoms continue to be an issue then we will refer to urology.  She will call if she continues to have issues and wants a referral.    [name] MD, 12:47 PM 03/09/2019

## 2019-03-09 NOTE — Patient Instructions (Signed)
Office will call you with the biopsy results.  Try Kegel exercises daily.  If urine leakage continues to be an issue then call and we will refer you to urology.

## 2019-03-10 LAB — WET PREP FOR TRICH, YEAST, CLUE

## 2019-03-11 LAB — URINALYSIS, COMPLETE W/RFL CULTURE
Bacteria, UA: NONE SEEN /HPF
Bilirubin Urine: NEGATIVE
Glucose, UA: NEGATIVE
Hyaline Cast: NONE SEEN /LPF
Ketones, ur: NEGATIVE
Leukocyte Esterase: NEGATIVE
Nitrites, Initial: NEGATIVE
Protein, ur: NEGATIVE
Specific Gravity, Urine: 1.025 (ref 1.001–1.03)
WBC, UA: NONE SEEN /HPF (ref 0–5)
pH: 7 (ref 5.0–8.0)

## 2019-03-11 LAB — TISSUE SPECIMEN

## 2019-03-11 LAB — URINE CULTURE
MICRO NUMBER:: 645546
Result:: NO GROWTH
SPECIMEN QUALITY:: ADEQUATE

## 2019-03-11 LAB — PATHOLOGY REPORT

## 2019-03-11 LAB — CULTURE INDICATED

## 2019-03-18 ENCOUNTER — Other Ambulatory Visit: Payer: Self-pay

## 2019-03-18 ENCOUNTER — Ambulatory Visit (INDEPENDENT_AMBULATORY_CARE_PROVIDER_SITE_OTHER): Payer: No Typology Code available for payment source | Admitting: Internal Medicine

## 2019-03-18 ENCOUNTER — Encounter: Payer: Self-pay | Admitting: Internal Medicine

## 2019-03-18 ENCOUNTER — Other Ambulatory Visit: Payer: No Typology Code available for payment source

## 2019-03-18 VITALS — Ht 62.5 in | Wt 163.0 lb

## 2019-03-18 DIAGNOSIS — L219 Seborrheic dermatitis, unspecified: Secondary | ICD-10-CM

## 2019-03-18 DIAGNOSIS — R002 Palpitations: Secondary | ICD-10-CM

## 2019-03-18 DIAGNOSIS — Z1231 Encounter for screening mammogram for malignant neoplasm of breast: Secondary | ICD-10-CM

## 2019-03-18 DIAGNOSIS — R0781 Pleurodynia: Secondary | ICD-10-CM

## 2019-03-18 DIAGNOSIS — R079 Chest pain, unspecified: Secondary | ICD-10-CM | POA: Diagnosis not present

## 2019-03-18 DIAGNOSIS — L659 Nonscarring hair loss, unspecified: Secondary | ICD-10-CM

## 2019-03-18 DIAGNOSIS — Z1283 Encounter for screening for malignant neoplasm of skin: Secondary | ICD-10-CM

## 2019-03-18 DIAGNOSIS — M25512 Pain in left shoulder: Secondary | ICD-10-CM

## 2019-03-18 DIAGNOSIS — W57XXXD Bitten or stung by nonvenomous insect and other nonvenomous arthropods, subsequent encounter: Secondary | ICD-10-CM

## 2019-03-18 DIAGNOSIS — M549 Dorsalgia, unspecified: Secondary | ICD-10-CM

## 2019-03-18 DIAGNOSIS — M6281 Muscle weakness (generalized): Secondary | ICD-10-CM | POA: Insufficient documentation

## 2019-03-18 DIAGNOSIS — G8929 Other chronic pain: Secondary | ICD-10-CM

## 2019-03-18 DIAGNOSIS — I10 Essential (primary) hypertension: Secondary | ICD-10-CM

## 2019-03-18 MED ORDER — CARVEDILOL 3.125 MG PO TABS: 3.1250 mg | ORAL_TABLET | Freq: Two times a day (BID) | ORAL | 3 refills | Status: DC

## 2019-03-18 MED ORDER — CYCLOBENZAPRINE HCL 5 MG PO TABS: 5.0000 mg | ORAL_TABLET | Freq: Every evening | ORAL | 2 refills | Status: DC | PRN

## 2019-03-18 NOTE — Patient Instructions (Addendum)
Please take vitamin D 3 4000 to 5000 IU daily over the counter with a multivitamin with iron    Fatigue If you have fatigue, you feel tired all the time and have a lack of energy or a lack of motivation. Fatigue may make it difficult to start or complete tasks because of exhaustion. In general, occasional or mild fatigue is often a normal response to activity or life. However, long-lasting (chronic) or extreme fatigue may be a symptom of a medical condition. Follow these instructions at home: General instructions  Watch your fatigue for any changes.  Go to bed and get up at the same time every day.  Avoid fatigue by pacing yourself during the day and getting enough sleep at night.  Maintain a healthy weight. Medicines  Take over-the-counter and prescription medicines only as told by your health care provider.  Take a multivitamin, if told by your health care provider.  Do not use herbal or dietary supplements unless they are approved by your health care provider. Activity   Exercise regularly, as told by your health care provider.  Use or practice techniques to help you relax, such as yoga, tai chi, meditation, or massage therapy. Eating and drinking   Avoid heavy meals in the evening.  Eat a well-balanced diet, which includes lean proteins, whole grains, plenty of fruits and vegetables, and low-fat dairy products.  Avoid consuming too much caffeine.  Avoid the use of alcohol.  Drink enough fluid to keep your urine pale yellow. Lifestyle  Change situations that cause you stress. Try to keep your work and personal schedule in balance.  Do not use any products that contain nicotine or tobacco, such as cigarettes and e-cigarettes. If you need help quitting, ask your health care provider.  Do not use drugs. Contact a health care provider if:  Your fatigue does not get better.  You have a fever.  You suddenly lose or gain weight.  You have headaches.  You have  trouble falling asleep or sleeping through the night.  You feel angry, guilty, anxious, or sad.  You are unable to have a bowel movement (constipation).  Your skin is dry.  You have swelling in your legs or another part of your body. Get help right away if:  You feel confused.  Your vision is blurry.  You feel faint or you pass out.  You have a severe headache.  You have severe pain in your abdomen, your back, or the area between your waist and hips (pelvis).  You have chest pain, shortness of breath, or an irregular or fast heartbeat.  You are unable to urinate, or you urinate less than normal.  You have abnormal bleeding, such as bleeding from the rectum, vagina, nose, lungs, or nipples.  You vomit blood.  You have thoughts about hurting yourself or others. If you ever feel like you may hurt yourself or others, or have thoughts about taking your own life, get help right away. You can go to your nearest emergency department or call:  Your local emergency services (911 in the [place]  A suicide crisis helpline, such as the [facility] at 505-154-8918. This is open 24 hours a day. Summary  If you have fatigue, you feel tired all the time and have a lack of energy or a lack of motivation.  Fatigue may make it difficult to start or complete tasks because of exhaustion.  Long-lasting (chronic) or extreme fatigue may be a symptom of a medical condition.  Exercise regularly, as told by your health care provider.  Change situations that cause you stress. Try to keep your work and personal schedule in balance. This information is not intended to replace advice given to you by your health care provider. Make sure you discuss any questions you have with your health care provider. Document Released: 06/16/2007 Document Revised: 12/10/2018 Document Reviewed: 05/14/2017 Elsevier Patient Education  2020 [employer].

## 2019-03-18 NOTE — Progress Notes (Addendum)
Virtual Visit via Video Note  I connected with [name]  on 03/18/19 at  9:00 AM EDT by a video enabled telemedicine application and verified that I am speaking with the correct person using two identifiers.  Location patient: home Persons participating in the virtual visit: patient, provider  I discussed the limitations of evaluation and management by telemedicine and the availability of in person appointments. The patient expressed understanding and agreed to proceed.   HPI: 1. C/o muscle weakness in arms and legs and fatigue since 06/2018 w/u with [place] legs negative 09/2018  labs CMET, tfts, B12 11/10/18 normal  No cause for symptoms   2. Vitamin D def D 21.40 11/10/18 and iron deficiency   3. C/o mid back pain on the right which radiates to right rib cage chronically reviewed MRI 12/2016 mid back normal but MRI C and L spine bulging discs and DDD pain is 5/10 daily and worse with talking a deep breath she is c/w lyme disease b/c couple years ago had bulls eye rash on back and not sure what bit her and wants testing for lyme  In the past she reports flexeril low dose has helped with the pain   4. S/p hysterectomy 08/11/18 fibroids and benign right ovarian cyst right   5. C/o chest pain radiating to mid back recently intermittently CT calcium score and CTA chest reviewed 11/2018 normal except small hiatal hernia and does not want to start PPI she feels this is not it . troponins have been negative in the past  6. C/o chronic left shoulder pain after fall years ago  7. She would like dermatology referral again    ROS: See pertinent positives and negatives per HPI.  Past Medical History:  Diagnosis Date  . Anemia   . Anxiety   . ASCUS favoring benign 12/2015   negative high risk HPV  . Chicken pox   . GERD (gastroesophageal reflux disease)    during pregnancy   . 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 Hyperlipidemia   . Hypertension    during pregnancy   . LGSIL (low grade squamous  intraepithelial dysplasia)    2004-2006  . Reflux     Past Surgical History:  Procedure Laterality Date  . COLPOSCOPY    . LAPAROSCOPIC VAGINAL HYSTERECTOMY WITH SALPINGO OOPHORECTOMY Bilateral 08/10/2018   Procedure: LAPAROSCOPIC ASSISTED VAGINAL HYSTERECTOMY WITH BILATERAL SALPINGO OOPHORECTOMY;  Surgeon: [name], MD;  Location: [facility];  Service: Gynecology;  Laterality: Bilateral;  . TONSILLECTOMY AND ADENOIDECTOMY      Family History  Problem Relation Age of Onset  . Hypertension Mother   . Cancer Mother        LUNG CANCER/SMOKER  . Depression Mother   . Mental illness Mother   . Diabetes Father   . Hypertension Father   . Heart disease Father   . Hyperlipidemia Father   . Stroke Father   . Hypertension Brother   . Diabetes Brother   . Depression Brother   . Hyperlipidemia Brother   . Cancer Maternal Uncle        ESOPHAGEAL CA  . Diabetes Cousin   . Heart disease Cousin   . Cancer Maternal Grandfather        Brain  . Stroke Paternal Grandmother   . Depression Maternal Grandmother   . Mental retardation Maternal Grandmother   . 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  carvedilol (COREG) 3.125 MG tablet, Take 1 tablet (3.125 mg total) by mouth 2 (two) times daily with a meal., Disp: 180 tablet, Rfl: 3 .  VITAMIN D PO, Take by mouth., Disp: , Rfl:  .  cyclobenzaprine (FLEXERIL) 5 MG tablet, Take 1 tablet (5 mg total) by mouth at bedtime as needed for muscle spasms., Disp: 30 tablet, Rfl: 2  EXAM:  VITALS per patient if applicable:  GENERAL: alert, oriented, appears well and in no acute distress  HEENT: atraumatic, conjunttiva clear, no obvious abnormalities on inspection of external nose and ears  NECK: normal movements of the head and neck  LUNGS: on inspection no signs of  respiratory distress, breathing rate appears normal, no obvious gross SOB, gasping or wheezing  CV: no obvious cyanosis  MS: moves all visible extremities without noticeable abnormality  PSYCH/NEURO: pleasant and cooperative, no obvious depression or anxiety, speech and thought processing grossly intact  ASSESSMENT AND PLAN:  Discussed the following assessment and plan: 48 y.o patient with multiple nonspecific complaints   Tick bite, subsequent encounter - Plan: B. burgdorfi antibodies, [place] spotted fvr ab, IgG-blood  Pleurtic chest pain in adult (ddx hiatal hernia causing GERD, autoimmune, consider GI etiology I.e gallsontes Reviewed CTA chest no PE or lung etiology and CT cardiac score low risk less likely cardiac  - check ANA, consider echo and US abdomen in future to w/u other etiology of sx's  -consider PPI or pepcid ac in the future pt declines for now  HTN/Palpitations/HLD - Plan: carvedilol (COREG) 3.125 MG tablet bid  -mailed cholesterol handout   Muscle weakness (generalized) - Plan: Ambulatory referral to Neurology for further w/u muscle weakness I.e brain imaging, NCS vs labs   Mid back pain - Plan: DG Thoracic Spine 2 View Chronic left shoulder pain - Plan: DG Shoulder Left   HM Flu shot had 05/2017  Tdap per pt had 02/2016 need to confirm at f/u   DEXA 03/09/18 normal  Pap 02/13/18 negative s/p hysterectomy 08/2018 fibroids and benign right ovarian cyst  - h/o abnormal pap LMP 10/19/17 Dr. 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   mammo 02/03/18  normal referred for another one this year  Referred dermatology missed appt 10/2017 for referral at that time    I discussed the assessment and treatment plan with the patient. The patient was provided an opportunity to ask questions and all were answered. The patient agreed with the plan and demonstrated an understanding of the instructions.   The patient was advised to call back or seek an in-person evaluation if the symptoms worsen or if the condition fails to improve as anticipated.  Time spent 25 minutes  Delorise Jackson, MD

## 2019-03-23 LAB — B. BURGDORFI ANTIBODIES: Lyme IgG/IgM Ab: <0.91 {ISR} (ref 0.00–0.90)

## 2019-03-23 LAB — ROCKY MTN SPOTTED FVR AB, IGG-BLOOD: RMSF IgG: NEGATIVE

## 2019-03-24 ENCOUNTER — Other Ambulatory Visit: Payer: Self-pay | Admitting: Internal Medicine

## 2019-03-24 DIAGNOSIS — R079 Chest pain, unspecified: Secondary | ICD-10-CM

## 2019-03-24 DIAGNOSIS — R1013 Epigastric pain: Secondary | ICD-10-CM

## 2019-04-05 ENCOUNTER — Ambulatory Visit (HOSPITAL_COMMUNITY): Payer: No Typology Code available for payment source

## 2019-04-06 ENCOUNTER — Ambulatory Visit (HOSPITAL_COMMUNITY)
Admission: RE | Admit: 2019-04-06 | Discharge: 2019-04-06 | Disposition: A | Discharge status: Home / Self Care | Payer: No Typology Code available for payment source | Source: Ambulatory Visit | Attending: Internal Medicine | Admitting: Internal Medicine

## 2019-04-06 ENCOUNTER — Other Ambulatory Visit: Payer: Self-pay

## 2019-04-06 ENCOUNTER — Other Ambulatory Visit (HOSPITAL_COMMUNITY): Payer: No Typology Code available for payment source

## 2019-04-06 DIAGNOSIS — I351 Nonrheumatic aortic (valve) insufficiency: Secondary | ICD-10-CM | POA: Diagnosis not present

## 2019-04-06 DIAGNOSIS — R600 Localized edema: Secondary | ICD-10-CM | POA: Insufficient documentation

## 2019-04-06 DIAGNOSIS — R079 Chest pain, unspecified: Secondary | ICD-10-CM | POA: Diagnosis present

## 2019-04-06 DIAGNOSIS — E785 Hyperlipidemia, unspecified: Secondary | ICD-10-CM | POA: Insufficient documentation

## 2019-04-06 DIAGNOSIS — I10 Essential (primary) hypertension: Secondary | ICD-10-CM | POA: Insufficient documentation

## 2019-04-06 DIAGNOSIS — R0781 Pleurodynia: Secondary | ICD-10-CM | POA: Insufficient documentation

## 2019-04-07 ENCOUNTER — Ambulatory Visit (HOSPITAL_COMMUNITY): Payer: No Typology Code available for payment source

## 2019-04-07 ENCOUNTER — Encounter: Payer: Self-pay | Admitting: Gynecology

## 2019-04-07 ENCOUNTER — Ambulatory Visit (INDEPENDENT_AMBULATORY_CARE_PROVIDER_SITE_OTHER): Payer: No Typology Code available for payment source | Admitting: Gynecology

## 2019-04-07 VITALS — BP 120/78 | Ht 62.0 in | Wt 164.0 lb

## 2019-04-07 DIAGNOSIS — Z01419 Encounter for gynecological examination (general) (routine) without abnormal findings: Secondary | ICD-10-CM

## 2019-04-07 NOTE — Patient Instructions (Addendum)
Follow-up for your mammogram as scheduled  Follow-up in 1 year for annual exam

## 2019-04-07 NOTE — Progress Notes (Signed)
    Tabytha Gradillas Hulse 1971/06/17 [id number]        48 y.o.  [id number] for annual gynecologic exam.  Doing well without complaints.  Was seen for granulation tissue a month ago due to bleeding and has had no recurrence of her bleeding after removal of the granulation tissue  Past medical history,surgical history, problem list, medications, allergies, family history and social history were all reviewed and documented as reviewed in the EPIC chart.  ROS:  Performed with pertinent positives and negatives included in the history, assessment and plan.   Additional significant findings : None   Exam: [name] assistant Vitals:   04/07/19 0820  BP: 120/78  Weight: 164 lb (74.4 kg)  Height: 5\' 2"  (1.575 m)   Body mass index is 30 kg/m.  General appearance:  Normal affect, orientation and appearance. Skin: Grossly normal HEENT: Without gross lesions.  No cervical or supraclavicular adenopathy. Thyroid normal.  Lungs:  Clear without wheezing, rales or rhonchi Cardiac: RR, without RMG Abdominal:  Soft, nontender, without masses, guarding, rebound, organomegaly or hernia Breasts:  Examined lying and sitting without masses, retractions, discharge or axillary adenopathy. Pelvic:  Ext, BUS, Vagina: Normal  Adnexa: Without masses or tenderness    Anus and perineum: Normal   Rectovaginal: Normal sphincter tone without palpated masses or tenderness.    Assessment/Plan:  48 y.o. [id number] female for annual gynecologic exam.    1. Status post LAVH BSO 2019.  Elected not to take HRT afterwards.  Is doing well without menopausal symptoms. 2. Pap smear 2019.  No Pap smear done today.  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  ASCUS 2017 with negative HPV.  Pap smear is otherwise normal.  Discussed options to include stop screening based on hysterectomy history versus less frequent screening interval.  Recommended follow-up Pap smear at 3-year interval for now. 3. Mammography due now and patient  reports that she has it scheduled.  Breast exam normal today. 4. Health maintenance.  No routine lab work done as patient does this elsewhere.  Follow-up 1 year, sooner as needed.   [name] MD, 8:46 AM 04/07/2019

## 2019-04-12 ENCOUNTER — Ambulatory Visit (HOSPITAL_COMMUNITY)
Admission: RE | Admit: 2019-04-12 | Discharge: 2019-04-12 | Disposition: A | Discharge status: Home / Self Care | Payer: No Typology Code available for payment source | Source: Ambulatory Visit | Attending: Internal Medicine | Admitting: Internal Medicine

## 2019-04-12 ENCOUNTER — Other Ambulatory Visit: Payer: Self-pay | Admitting: Internal Medicine

## 2019-04-12 ENCOUNTER — Ambulatory Visit
Admission: RE | Admit: 2019-04-12 | Discharge: 2019-04-12 | Disposition: A | Discharge status: Home / Self Care | Payer: No Typology Code available for payment source | Source: Ambulatory Visit | Attending: Internal Medicine | Admitting: Internal Medicine

## 2019-04-12 ENCOUNTER — Other Ambulatory Visit: Payer: Self-pay

## 2019-04-12 DIAGNOSIS — M549 Dorsalgia, unspecified: Secondary | ICD-10-CM

## 2019-04-12 DIAGNOSIS — G8929 Other chronic pain: Secondary | ICD-10-CM

## 2019-04-12 DIAGNOSIS — R1013 Epigastric pain: Secondary | ICD-10-CM | POA: Diagnosis present

## 2019-04-12 DIAGNOSIS — M1990 Unspecified osteoarthritis, unspecified site: Secondary | ICD-10-CM

## 2019-04-19 ENCOUNTER — Other Ambulatory Visit: Payer: No Typology Code available for payment source

## 2019-04-23 ENCOUNTER — Other Ambulatory Visit: Payer: No Typology Code available for payment source

## 2019-04-23 DIAGNOSIS — R0781 Pleurodynia: Secondary | ICD-10-CM

## 2019-04-23 NOTE — Telephone Encounter (Signed)
err

## 2019-04-24 LAB — ANA: Anti Nuclear Antibody (ANA): NEGATIVE

## 2019-04-24 LAB — SPECIMEN STATUS REPORT
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
## 2019-04-29 ENCOUNTER — Ambulatory Visit: Payer: No Typology Code available for payment source | Admitting: Neurology

## 2019-05-04 ENCOUNTER — Ambulatory Visit: Payer: No Typology Code available for payment source

## 2019-05-06 ENCOUNTER — Ambulatory Visit: Payer: No Typology Code available for payment source

## 2019-05-12 ENCOUNTER — Telehealth: Payer: Self-pay | Admitting: Internal Medicine

## 2019-05-12 NOTE — Telephone Encounter (Signed)
FYI

## 2019-05-12 NOTE — Telephone Encounter (Signed)
Juliann Pulse, from [facility], called stating pt did not show up for her appt today and they will not be rescheduling her. Please advise.   [phone]

## 2019-05-17 NOTE — Telephone Encounter (Signed)
Inform pt if wants dermatology appt must call to reschedule # below    Juliann Pulse, from [facility], called stating pt did not show up for her appt today and they will not be rescheduling her. Please advise.   [phone]

## 2019-05-18 NOTE — Telephone Encounter (Signed)
Spoke with pt and she stated that she would give them a call back to reschedule. Pt was given the number listed below.

## 2019-05-25 ENCOUNTER — Encounter: Payer: Self-pay | Admitting: Gynecology

## 2019-06-07 ENCOUNTER — Encounter

## 2019-06-07 ENCOUNTER — Encounter: Payer: Self-pay | Admitting: Neurology

## 2019-06-07 ENCOUNTER — Ambulatory Visit: Payer: No Typology Code available for payment source | Admitting: Neurology

## 2019-06-24 ENCOUNTER — Other Ambulatory Visit: Payer: Self-pay

## 2019-06-24 ENCOUNTER — Ambulatory Visit (INDEPENDENT_AMBULATORY_CARE_PROVIDER_SITE_OTHER): Payer: No Typology Code available for payment source | Admitting: Internal Medicine

## 2019-06-24 VITALS — Ht 62.0 in | Wt 168.0 lb

## 2019-06-24 DIAGNOSIS — M1990 Unspecified osteoarthritis, unspecified site: Secondary | ICD-10-CM | POA: Insufficient documentation

## 2019-06-24 DIAGNOSIS — R32 Unspecified urinary incontinence: Secondary | ICD-10-CM | POA: Insufficient documentation

## 2019-06-24 DIAGNOSIS — N281 Cyst of kidney, acquired: Secondary | ICD-10-CM

## 2019-06-24 DIAGNOSIS — I351 Nonrheumatic aortic (valve) insufficiency: Secondary | ICD-10-CM

## 2019-06-24 DIAGNOSIS — N644 Mastodynia: Secondary | ICD-10-CM

## 2019-06-24 DIAGNOSIS — M7552 Bursitis of left shoulder: Secondary | ICD-10-CM

## 2019-06-24 DIAGNOSIS — K449 Diaphragmatic hernia without obstruction or gangrene: Secondary | ICD-10-CM

## 2019-06-24 DIAGNOSIS — E785 Hyperlipidemia, unspecified: Secondary | ICD-10-CM

## 2019-06-24 DIAGNOSIS — Z1231 Encounter for screening mammogram for malignant neoplasm of breast: Secondary | ICD-10-CM

## 2019-06-24 MED ORDER — CYCLOBENZAPRINE HCL 5 MG PO TABS: 5.0000 mg | ORAL_TABLET | Freq: Every evening | ORAL | 2 refills | Status: DC | PRN

## 2019-06-24 MED ORDER — PREDNISONE 20 MG PO TABS: 20.0000 mg | ORAL_TABLET | Freq: Every day | ORAL | 0 refills | Status: DC

## 2019-06-24 MED FILL — predniSONE 20 MG TABS: 20 | 7 days supply | Qty: 7 | Fill #0

## 2019-06-24 NOTE — Progress Notes (Signed)
Virtual Visit via Video Note  I connected with [name]   on 06/24/19 at  8:15 AM EDT by a video enabled telemedicine application and verified that I am speaking with the correct person using two identifiers.  Location patient: home Location provider:work or home office Persons participating in the virtual visit: patient, provider  I discussed the limitations of evaluation and management by telemedicine and the availability of in person appointments. The patient expressed understanding and agreed to proceed.   HPI: 1. HLD  2. Vitamin D def 21.40 not taking vitamin D  3. C/o left upper back pain and mid back pain bursitis noted on MRI 2018 and Xray with arthritis mid back pt wants to hold on ortho referral and PT due to transportation issues  4. Grade 1 DD and normal EF with mild to mod AR f/u with Dr. Debara Pickett 07/06/2019  5. C/o urinary incontinence since hysterectomy and playing frisbie 2 or 11/2018 and jumped up high in the air and felt a pull and had trouble with incontinence since then will disc with Dr. Wayne Both as US abdomen with parapelvis cyst with left renal collection system fullness consider urology    ROS: See pertinent positives and negatives per HPI.  Past Medical History:  Diagnosis Date  . Anemia   . Anxiety   . ASCUS favoring benign 12/2015   negative high risk HPV  . Chicken pox   . GERD (gastroesophageal reflux disease)    during pregnancy   . 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 Hyperlipidemia   . Hypertension    during pregnancy   . 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 Reflux     Past Surgical History:  Procedure Laterality Date  . COLPOSCOPY    . LAPAROSCOPIC VAGINAL HYSTERECTOMY WITH SALPINGO OOPHORECTOMY Bilateral 08/10/2018   Procedure: LAPAROSCOPIC ASSISTED VAGINAL HYSTERECTOMY WITH BILATERAL SALPINGO OOPHORECTOMY;  Surgeon: [name], MD;  Location: [facility];  Service: Gynecology;   Laterality: Bilateral;  . TONSILLECTOMY AND ADENOIDECTOMY      Family History  Problem Relation Age of Onset  . Hypertension Mother   . Cancer Mother        LUNG CANCER/SMOKER  . Depression Mother   . Mental illness Mother   . Diabetes Father   . Hypertension Father   . Heart disease Father   . Hyperlipidemia Father   . Stroke Father   . Hypertension Brother   . Diabetes Brother   . Depression Brother   . Hyperlipidemia Brother   . Cancer Maternal Uncle        ESOPHAGEAL CA  . Diabetes Cousin   . Heart disease Cousin   . Cancer Maternal Grandfather        Brain  . Stroke Paternal Grandmother   . Depression Maternal Grandmother   . Mental retardation Maternal Grandmother   . Stroke Paternal Grandfather     SOCIAL HX:  Lives alone     Current Outpatient Medications:  .  carvedilol (COREG) 3.125 MG tablet, Take 1 tablet (3.125 mg total) by mouth 2 (two) times daily with a meal., Disp: 180 tablet, Rfl: 3 .  cyclobenzaprine (FLEXERIL) 5 MG tablet, Take 1 tablet (5 mg total) by mouth at bedtime as needed for muscle spasms., Disp: 30 tablet, Rfl: 2 .  VITAMIN D PO, Take by mouth., Disp: , Rfl:  .  predniSONE (DELTASONE) 20 MG tablet, Take 1 tablet (20 mg  total) by mouth daily with breakfast., Disp: 7 tablet, Rfl: 0  EXAM:  VITALS per patient if applicable:  GENERAL: alert, oriented, appears well and in no acute distress  HEENT: atraumatic, conjunttiva clear, no obvious abnormalities on inspection of external nose and ears  NECK: normal movements of the head and neck  LUNGS: on inspection no signs of respiratory distress, breathing rate appears normal, no obvious gross SOB, gasping or wheezing  CV: no obvious cyanosis  MS: moves all visible extremities without noticeable abnormality  PSYCH/NEURO: pleasant and cooperative, no obvious depression or anxiety, speech and thought processing grossly intact  ASSESSMENT AND PLAN:  Discussed the following assessment and  plan:  Arthritis - Plan: predniSONE (DELTASONE) 20 MG tablet x 1 week in am Consider f/u [facility] for injections and PT with diffuse arthritis C, T and L spine and bursitis  Try lidocaine patches over the counter for pain, biofreeze or salonpas or aspercream for areas of pain  Prn Flexeril   Chronic shoulder bursitis, left - Plan: predniSONE (DELTASONE) 20 MG tablet  Parapelvic renal cyst Urinary incontinence, unspecified type -consider urology referral vs gyn   Hyperlipidemia, unspecified hyperlipidemia type rec healthy diet and exercise   Aortic valve insufficiency, etiology of cardiac valve disease unspecified F/u with Dr. Debara Pickett 07/05/2019   Hiatal hernia  Try pepcid, prilosec or nexium over the counter for heartburn if bothered by hiatal hernia   HM Flu shot had 06/23/19 Tdap per pt had 02/2016 need to confirm at f/u   DEXA 03/09/18 normal  Pap 02/13/18 negative s/p hysterectomy 08/2018 fibroids and benign right ovarian cyst  -h/o abnormal pap LMP 10/19/17 Dr. Donalynn Furlong [facility] in [place] -obtained pap 12/06/15 ASCUS neg hpv; h/o persistent LGSIL 2004-2006 with neg pap afterwards  -also menorrhagia/breakthrough bleeding/right solid ovarian mass/endocmetrial polp noted 12/06/15 rec hysterectomy at that time and never f/u on this vs [place] and endometrial sampling if polyp then hysteroscopy with D&C with resection of polyp endometrial bx in 2012 benign fragments of endometrial polyp   Colonoscopy consider  Skin still needs to schedule  mammo 02/03/18 normal referred for another one this year has not scheduled     -we discussed possible serious and likely etiologies, options for evaluation and workup, limitations of telemedicine visit vs in person visit, treatment, treatment risks and precautions. Pt prefers to treat via telemedicine empirically rather then risking or undertaking an in person visit at this moment. Patient agrees to seek prompt in person care if worsening, new  symptoms arise, or if is not improving with treatment.   I discussed the assessment and treatment plan with the patient. The patient was provided an opportunity to ask questions and all were answered. The patient agreed with the plan and demonstrated an understanding of the instructions.   The patient was advised to call back or seek an in-person evaluation if the symptoms worsen or if the condition fails to improve as anticipated.  Time spent 25 minutes  Delorise Jackson, MD

## 2019-06-24 NOTE — Patient Instructions (Addendum)
Vitamin D3 5000 IU daily  Schedule you mammogram  Consider colonoscopy   Try lidocaine patches over the counter for pain, biofreeze or salonpas or aspercream for areas of pain   Try pepcid, prilosec or nexium over the counter for heartburn if bothered by hiatal hernia     High Cholesterol  High cholesterol is a condition in which the blood has high levels of a white, waxy, fat-like substance (cholesterol). The human body needs small amounts of cholesterol. The liver makes all the cholesterol that the body needs. Extra (excess) cholesterol comes from the food that we eat. Cholesterol is carried from the liver by the blood through the blood vessels. If you have high cholesterol, deposits (plaques) may build up on the walls of your blood vessels (arteries). Plaques make the arteries narrower and stiffer. Cholesterol plaques increase your risk for heart attack and stroke. Work with your health care provider to keep your cholesterol levels in a healthy range. What increases the risk? This condition is more likely to develop in people who:  Eat foods that are high in animal fat (saturated fat) or cholesterol.  Are overweight.  Are not getting enough exercise.  Have a family history of high cholesterol. What are the signs or symptoms? There are no symptoms of this condition. How is this diagnosed? This condition may be diagnosed from the results of a blood test.  If you are older than age 54, your health care provider may check your cholesterol every 4-6 years.  You may be checked more often if you already have high cholesterol or other risk factors for heart disease. The blood test for cholesterol measures:  "Bad" cholesterol (LDL cholesterol). This is the main type of cholesterol that causes heart disease. The desired level for LDL is less than 100.  "Good" cholesterol (HDL cholesterol). This type helps to protect against heart disease by cleaning the arteries and carrying the LDL  away. The desired level for HDL is 60 or higher.  Triglycerides. These are fats that the body can store or burn for energy. The desired number for triglycerides is lower than 150.  Total cholesterol. This is a measure of the total amount of cholesterol in your blood, including LDL cholesterol, HDL cholesterol, and triglycerides. A healthy number is less than 200. How is this treated? This condition is treated with diet changes, lifestyle changes, and medicines. Diet changes  This may include eating more whole grains, fruits, vegetables, nuts, and fish.  This may also include cutting back on red meat and foods that have a lot of added sugar. Lifestyle changes  Changes may include getting at least 40 minutes of aerobic exercise 3 times a week. Aerobic exercises include walking, biking, and swimming. Aerobic exercise along with a healthy diet can help you maintain a healthy weight.  Changes may also include quitting smoking. Medicines  Medicines are usually given if diet and lifestyle changes have failed to reduce your cholesterol to healthy levels.  Your health care provider may prescribe a statin medicine. Statin medicines have been shown to reduce cholesterol, which can reduce the risk of heart disease. Follow these instructions at home: Eating and drinking If told by your health care provider:  Eat chicken (without skin), fish, veal, shellfish, ground [place] breast, and round or loin cuts of red meat.  Do not eat fried foods or fatty meats, such as hot dogs and salami.  Eat plenty of fruits, such as apples.  Eat plenty of vegetables, such as broccoli, potatoes,  and carrots.  Eat beans, peas, and lentils.  Eat grains such as barley, rice, couscous, and bulgur wheat.  Eat pasta without cream sauces.  Use skim or nonfat milk, and eat low-fat or nonfat yogurt and cheeses.  Do not eat or drink whole milk, cream, ice cream, egg yolks, or hard cheeses.  Do not eat stick  margarine or tub margarines that contain trans fats (also called partially hydrogenated oils).  Do not eat saturated tropical oils, such as coconut oil and palm oil.  Do not eat cakes, cookies, crackers, or other baked goods that contain trans fats.  General instructions  Exercise as directed by your health care provider. Increase your activity level with activities such as gardening, walking, and taking the stairs.  Take over-the-counter and prescription medicines only as told by your health care provider.  Do not use any products that contain nicotine or tobacco, such as cigarettes and e-cigarettes. If you need help quitting, ask your health care provider.  Keep all follow-up visits as told by your health care provider. This is important. Contact a health care provider if:  You are struggling to maintain a healthy diet or weight.  You need help to start on an exercise program.  You need help to stop smoking. Get help right away if:  You have chest pain.  You have trouble breathing. This information is not intended to replace advice given to you by your health care provider. Make sure you discuss any questions you have with your health care provider. Document Released: 08/19/2005 Document Revised: 08/22/2017 Document Reviewed: 02/17/2016 Elsevier Patient Education  [place].  Cholesterol Content in Foods Cholesterol is a waxy, fat-like substance that helps to carry fat in the blood. The body needs cholesterol in small amounts, but too much cholesterol can cause damage to the arteries and heart. Most people should eat less than 200 milligrams (mg) of cholesterol a day. Foods with cholesterol  Cholesterol is found in animal-based foods, such as meat, seafood, and dairy. Generally, low-fat dairy and lean meats have less cholesterol than full-fat dairy and fatty meats. The milligrams of cholesterol per serving (mg per serving) of common cholesterol-containing foods are  listed below. Meat and other proteins  Egg - one large whole egg has 186 mg.  Veal shank - 4 oz has 141 mg.  Lean ground [place] (93% lean) - 4 oz has 118 mg.  Fat-trimmed lamb loin - 4 oz has 106 mg.  Lean ground beef (90% lean) - 4 oz has 100 mg.  Lobster - 3.5 oz has 90 mg.  Pork loin chops - 4 oz has 86 mg.  Canned salmon - 3.5 oz has 83 mg.  Fat-trimmed beef top loin - 4 oz has 78 mg.  Frankfurter - 1 frank (3.5 oz) has 77 mg.  Crab - 3.5 oz has 71 mg.  Roasted chicken without skin, white meat - 4 oz has 66 mg.  Light bologna - 2 oz has 45 mg.  Deli-cut [place] - 2 oz has 31 mg.  Canned tuna - 3.5 oz has 31 mg.  Bacon - 1 oz has 29 mg.  Oysters and mussels (raw) - 3.5 oz has 25 mg.  Mackerel - 1 oz has 22 mg.  Trout - 1 oz has 20 mg.  Pork sausage - 1 link (1 oz) has 17 mg.  Salmon - 1 oz has 16 mg.  Tilapia - 1 oz has 14 mg. Dairy  Soft-serve ice cream -  cup (  4 oz) has 103 mg.  Whole-milk yogurt - 1 cup (8 oz) has 29 mg.  Cheddar cheese - 1 oz has 28 mg.  American cheese - 1 oz has 28 mg.  Whole milk - 1 cup (8 oz) has 23 mg.  2% milk - 1 cup (8 oz) has 18 mg.  Cream cheese - 1 tablespoon (Tbsp) has 15 mg.  Cottage cheese -  cup (4 oz) has 14 mg.  Low-fat (1%) milk - 1 cup (8 oz) has 10 mg.  Sour cream - 1 Tbsp has 8.5 mg.  Low-fat yogurt - 1 cup (8 oz) has 8 mg.  Nonfat Greek yogurt - 1 cup (8 oz) has 7 mg.  Half-and-half cream - 1 Tbsp has 5 mg. Fats and oils  Cod liver oil - 1 tablespoon (Tbsp) has 82 mg.  Butter - 1 Tbsp has 15 mg.  Lard - 1 Tbsp has 14 mg.  Bacon grease - 1 Tbsp has 14 mg.  Mayonnaise - 1 Tbsp has 5-10 mg.  Margarine - 1 Tbsp has 3-10 mg. Exact amounts of cholesterol in these foods may vary depending on specific ingredients and brands. Foods without cholesterol Most plant-based foods do not have cholesterol unless you combine them with a food that has cholesterol. Foods without cholesterol include:   Grains and cereals.  Vegetables.  Fruits.  Vegetable oils, such as olive, canola, and sunflower oil.  Legumes, such as peas, beans, and lentils.  Nuts and seeds.  Egg whites. Summary  The body needs cholesterol in small amounts, but too much cholesterol can cause damage to the arteries and heart.  Most people should eat less than 200 milligrams (mg) of cholesterol a day. This information is not intended to replace advice given to you by your health care provider. Make sure you discuss any questions you have with your health care provider. Document Released: 04/15/2017 Document Revised: 08/01/2017 Document Reviewed: 04/15/2017 Elsevier Patient Education  2020 [place].  Vitamin D Deficiency Vitamin D deficiency is when your body does not have enough vitamin D. Vitamin D is important to your body for many reasons:  It helps the body absorb two important minerals-calcium and phosphorus.  It plays a role in bone health.  It may help to prevent some diseases, such as diabetes and multiple sclerosis.  It plays a role in muscle function, including heart function. If vitamin D deficiency is severe, it can cause a condition in which your bones become soft. In adults, this condition is called osteomalacia. In children, this condition is called rickets. What are the causes? This condition may be caused by:  Not eating enough foods that contain vitamin D.  Not getting enough natural sun exposure.  Having certain digestive system diseases that make it difficult for your body to absorb vitamin D. These diseases include Crohn's disease, chronic pancreatitis, and cystic fibrosis.  Having a surgery in which a part of the stomach or a part of the small intestine is removed.  Having chronic kidney disease or liver disease. What increases the risk? You are more likely to develop this condition if you:  Are older.  Do not spend much time outdoors.  Live in a long-term care  facility.  Have had broken bones.  Have weak or thin bones (osteoporosis).  Have a disease or condition that changes how the body absorbs vitamin D.  Have dark skin.  Take certain medicines, such as steroid medicines or certain seizure medicines.  Are overweight or obese.  What are the signs or symptoms? In mild cases of vitamin D deficiency, there may not be any symptoms. If the condition is severe, symptoms may include:  Bone pain.  Muscle pain.  Falling often.  Broken bones caused by a minor injury. How is this diagnosed? This condition may be diagnosed with blood tests. Imaging tests such as X-rays may also be done to look for changes in the bone. How is this treated? Treatment for this condition may depend on what caused the condition. Treatment options include:  Taking vitamin D supplements. Your health care provider will suggest what dose is best for you.  Taking a calcium supplement. Your health care provider will suggest what dose is best for you. Follow these instructions at home: Eating and drinking   Eat foods that contain vitamin D. Choices include: ? Fortified dairy products, cereals, or juices. Fortified means that vitamin D has been added to the food. Check the label on the package to see if the food is fortified. ? Fatty fish, such as salmon or trout. ? Eggs. ? Oysters. ? Mushrooms. The items listed above may not be a complete list of recommended foods and beverages. Contact a dietitian for more information. General instructions  Take medicines and supplements only as told by your health care provider.  Get regular, safe exposure to natural sunlight.  Do not use a tanning bed.  Maintain a healthy weight. Lose weight if needed.  Keep all follow-up visits as told by your health care provider. This is important. How is this prevented? You can get vitamin D by:  Eating foods that naturally contain vitamin D.  Eating or drinking products that have  been fortified with vitamin D, such as cereals, juices, and dairy products (including milk).  Taking a vitamin D supplement or a multivitamin supplement that contains vitamin D.  Being in the sun. Your body naturally makes vitamin D when your skin is exposed to sunlight. Your body changes the sunlight into a form of the vitamin that it can use. Contact a health care provider if:  Your symptoms do not go away.  You feel nauseous or you vomit.  You have fewer bowel movements than usual or are constipated. Summary  Vitamin D deficiency is when your body does not have enough vitamin D.  Vitamin D is important to your body for good bone health and muscle function, and it may help prevent some diseases.  Vitamin D deficiency is primarily treated through supplementation. Your health care provider will suggest what dose is best for you.  You can get vitamin D by eating foods that contain vitamin D, by being in the sun, and by taking a vitamin D supplement or a multivitamin supplement that contains vitamin D. This information is not intended to replace advice given to you by your health care provider. Make sure you discuss any  questions you have with your health care provider. Document Released: 11/11/2011 Document Revised: 04/27/2018 Document Reviewed: 04/27/2018 Elsevier Patient Education  2020 [place].   Hiatal Hernia  A hiatal hernia occurs when part of the stomach slides above the muscle that separates the abdomen from the chest (diaphragm). A person can be born with a hiatal hernia (congenital), or it may develop over time. In almost all cases of hiatal hernia, only the top part of the stomach pushes through the diaphragm. Many people have a hiatal hernia with no symptoms. The larger the hernia, the more likely it is that you will have symptoms.  In some cases, a hiatal hernia allows stomach acid to flow back into the tube that carries food from your mouth to your stomach  (esophagus). This may cause heartburn symptoms. Severe heartburn symptoms may mean that you have developed a condition called gastroesophageal reflux disease (GERD). What are the causes? This condition is caused by a weakness in the opening (hiatus) where the esophagus passes through the diaphragm to attach to the upper part of the stomach. A person may be born with a weakness in the hiatus, or a weakness can develop over time. What increases the risk? This condition is more likely to develop in:  Older people. Age is a major risk factor for a hiatal hernia, especially if you are over the age of 39.  Pregnant women.  People who are overweight.  People who have frequent constipation. What are the signs or symptoms? Symptoms of this condition usually develop in the form of GERD symptoms. Symptoms include:  Heartburn.  Belching.  Indigestion.  Trouble swallowing.  Coughing or wheezing.  Sore throat.  Hoarseness.  Chest pain.  Nausea and vomiting. How is this diagnosed? This condition may be diagnosed during testing for GERD. Tests that may be done include:  X-rays of your stomach or chest.  An upper gastrointestinal (GI) series. This is an X-ray exam of your GI tract that is taken after you swallow a chalky liquid that shows up clearly on the X-ray.  Endoscopy. This is a procedure to look into your stomach using a thin, flexible tube that has a tiny camera and light on the end of it. How is this treated? This condition may be treated by:  Dietary and lifestyle changes to help reduce GERD symptoms.  Medicines. These may include: ? Over-the-counter antacids. ? Medicines that make your stomach empty more quickly. ? Medicines that block the production of stomach acid (H2 blockers). ? Stronger medicines to reduce stomach acid (proton pump inhibitors).  Surgery to repair the hernia, if other treatments are not helping. If you have no symptoms, you may not need treatment.  Follow these instructions at home: Lifestyle and activity  Do not use any products that contain nicotine or tobacco, such as cigarettes and e-cigarettes. If you need help quitting, ask your health care provider.  Try to achieve and maintain a healthy body weight.  Avoid putting pressure on your abdomen. Anything that puts pressure on your abdomen increases the amount of acid that may be pushed up into your esophagus. ? Avoid bending over, especially after eating. ? Raise the head of your bed by putting blocks under the legs. This keeps your head and esophagus higher than your stomach. ? Do not wear tight clothing around your chest or stomach. ? Try not to strain when having a bowel movement, when urinating, or when lifting heavy objects. Eating and drinking  Avoid foods that can worsen GERD symptoms. These may include: ? Fatty foods, like fried foods. ? Citrus fruits, like oranges or lemon. ? Other foods and drinks that contain acid, like orange juice or tomatoes. ? Spicy food. ? Chocolate.  Eat frequent small meals instead of three large meals a day. This helps prevent your stomach from getting too full. ? Eat slowly. ? Do not lie down right after eating. ? Do not eat 1-2 hours before bed.  Do not drink beverages with caffeine. These include cola, coffee, cocoa, and tea.  Do not drink alcohol. General instructions  Take over-the-counter and prescription medicines only as told by  your health care provider.  Keep all follow-up visits as told by your health care provider. This is important. Contact a health care provider if:  Your symptoms are not controlled with medicines or lifestyle changes.  You are having trouble swallowing.  You have coughing or wheezing that will not go away. Get help right away if:  Your pain is getting worse.  Your pain spreads to your arms, neck, jaw, teeth, or back.  You have shortness of breath.  You sweat for no reason.  You feel sick to  your stomach (nauseous) or you vomit.  You vomit blood.  You have bright red blood in your stools.  You have black, tarry stools. This information is not intended to replace advice given to you by your health care provider. Make sure you discuss any questions you have with your health care provider. Document Released: 11/09/2003 Document Revised: 08/01/2017 Document Reviewed: 03/24/2017 Elsevier Patient Education  2020 [employer].

## 2019-07-06 ENCOUNTER — Encounter: Payer: Self-pay | Admitting: Internal Medicine

## 2019-07-06 ENCOUNTER — Ambulatory Visit (INDEPENDENT_AMBULATORY_CARE_PROVIDER_SITE_OTHER): Payer: No Typology Code available for payment source | Admitting: Internal Medicine

## 2019-07-06 ENCOUNTER — Other Ambulatory Visit: Payer: Self-pay

## 2019-07-06 ENCOUNTER — Encounter

## 2019-07-06 VITALS — BP 138/91 | HR 63 | Ht 62.0 in | Wt 166.4 lb

## 2019-07-06 DIAGNOSIS — E785 Hyperlipidemia, unspecified: Secondary | ICD-10-CM

## 2019-07-06 DIAGNOSIS — I351 Nonrheumatic aortic (valve) insufficiency: Secondary | ICD-10-CM | POA: Diagnosis not present

## 2019-07-06 DIAGNOSIS — I10 Essential (primary) hypertension: Secondary | ICD-10-CM | POA: Diagnosis not present

## 2019-07-06 LAB — LIPID PANEL
Chol/HDL Ratio: 3.6 ratio (ref 0.0–4.4)
Cholesterol, Total: 221 mg/dL — ABNORMAL HIGH (ref 100–199)
HDL: 61 mg/dL (ref >39)
LDL Chol Calc (NIH): 144 mg/dL — ABNORMAL HIGH (ref 0–99)
Triglycerides: 93 mg/dL (ref 0–149)
VLDL Cholesterol Cal: 16 mg/dL (ref 5–40)

## 2019-07-06 NOTE — Patient Instructions (Signed)
Medication Instructions:  No changes *If you need a refill on your cardiac medications before your next appointment, please call your pharmacy*  Lab Work: FASTING lab work to check cholesterol If you have labs (blood work) drawn today and your tests are completely normal, you will receive your results only by: Marland Kitchen MyChart Message (if you have MyChart) OR . A paper copy in the mail If you have any lab test that is abnormal or we need to change your treatment, we will call you to review the results.  Testing/Procedures: Echo in 1 year  Follow-Up: At [facility], you and your health needs are our priority.  As part of our continuing mission to provide you with exceptional heart care, we have created designated Provider Care Teams.  These Care Teams include your primary Cardiologist (physician) and Advanced Practice Providers (APPs -  Physician Assistants and Nurse Practitioners) who all work together to provide you with the care you need, when you need it.  Your next appointment:   12 months  The format for your next appointment:   In Person  Provider:   K. Mali Hilty, MD  Other Instructions

## 2019-07-07 ENCOUNTER — Telehealth: Payer: Self-pay | Admitting: Internal Medicine

## 2019-07-07 ENCOUNTER — Encounter: Payer: Self-pay | Admitting: Internal Medicine

## 2019-07-07 DIAGNOSIS — E785 Hyperlipidemia, unspecified: Secondary | ICD-10-CM

## 2019-07-07 MED ORDER — ROSUVASTATIN CALCIUM 20 MG PO TABS: 20.0000 mg | ORAL_TABLET | Freq: Every day | ORAL | 3 refills | Status: DC

## 2019-07-07 NOTE — Telephone Encounter (Signed)
Patient called w/results °Rx(s) sent to pharmacy electronically. °Lab order mailed °

## 2019-07-07 NOTE — Progress Notes (Addendum)
OFFICE NOTE  Chief Complaint:  Follow-up  Primary Care Physician: McLean-Scocuzza, Renee Glow, MD  HPI:  [name] is a 48 y.o. female with a past medial history significant for leg swelling. I had last seen her in 2019 for leg swelling - recently she had chest pain - more upper back and shoulder blade, constant, worse with deep breathing. She was seen in the ER and had a negative CT for PE. Subsequently, she saw Renee Deforest, PA-C in the office. He ordered a CT coronary angiogram which was performed on 11/17/2018 - this demonstrated 0 (zero) coronary calcium, however, there was a small amount of non-calcified plaque in the LAD (10-20%) - aggressive risk factor modification was recommended. She had recent labs, including a lipid profile which showed an LDL of 223, HDL of 68 and trigs of 140. I reviewed these results with her today and my recommendations for aggressive dietary intervention and possible lipid lowering therapy to target LDL of <70.  07/06/2019  [name] returns today for follow-up. We recommended a repeat lipid profile which was not performed to help determine if therapy was recommended. This was performed and did demonstrate elevated LDL-C of 144, TC 221. Based on these findings, would recommend statin therapy.  PMHx:  Past Medical History:  Diagnosis Date  . Anemia   . Anxiety   . ASCUS favoring benign 12/2015   negative high risk HPV  . Chicken pox   . GERD (gastroesophageal reflux disease)    during pregnancy   . 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 Hyperlipidemia   . Hypertension    during pregnancy   . 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 Reflux     Past Surgical History:  Procedure Laterality Date  . COLPOSCOPY    . LAPAROSCOPIC VAGINAL HYSTERECTOMY WITH SALPINGO OOPHORECTOMY Bilateral 08/10/2018   Procedure: LAPAROSCOPIC ASSISTED VAGINAL HYSTERECTOMY WITH BILATERAL SALPINGO OOPHORECTOMY;  Surgeon: Renee Auerbach, MD;   Location: [facility];  Service: Gynecology;  Laterality: Bilateral;  . TONSILLECTOMY AND ADENOIDECTOMY      FAMHx:  Family History  Problem Relation Age of Onset  . Hypertension Mother   . Cancer Mother        LUNG CANCER/SMOKER  . Depression Mother   . Mental illness Mother   . Diabetes Father   . Hypertension Father   . Heart disease Father   . Hyperlipidemia Father   . Stroke Father   . Hypertension Brother   . Diabetes Brother   . Depression Brother   . Hyperlipidemia Brother   . Cancer Maternal Uncle        ESOPHAGEAL CA  . Diabetes Cousin   . Heart disease Cousin   . Cancer Maternal Grandfather        Brain  . Stroke Paternal Grandmother   . Depression Maternal Grandmother   . Mental retardation Maternal Grandmother   . Stroke Paternal Grandfather     SOCHx:   reports that she quit smoking about 28 years ago. Her smoking use included cigarettes. She has never used smokeless tobacco. She reports previous alcohol use. She reports that she does not use drugs.  ALLERGIES:  Allergies  Allergen Reactions  . Codeine Shortness Of Breath    Breathing problems  - liquid med   . Erythromycin     GI upset   . Levofloxacin     Heart racing     ROS: Pertinent  items noted in HPI and remainder of comprehensive ROS otherwise negative.  HOME MEDS: Current Outpatient Medications on File Prior to Visit  Medication Sig Dispense Refill  . carvedilol (COREG) 3.125 MG tablet Take 1 tablet (3.125 mg total) by mouth 2 (two) times daily with a meal. 180 tablet 3  . cyclobenzaprine (FLEXERIL) 5 MG tablet Take 1 tablet (5 mg total) by mouth at bedtime as needed for muscle spasms. 30 tablet 2  . predniSONE (DELTASONE) 20 MG tablet Take 1 tablet (20 mg total) by mouth daily with breakfast. 7 tablet 0  . VITAMIN D PO Take by mouth.     No current facility-administered medications on file prior to visit.     LABS/IMAGING: Results for orders placed or performed in  visit on 07/06/19 (from the past 48 hour(s))  Lipid panel     Status: Abnormal   Collection Time: 07/06/19 10:04 AM  Result Value Ref Range   Cholesterol, Total 221 (H) 100 - 199 mg/dL   Triglycerides 93 0 - 149 mg/dL   HDL 61 >39 mg/dL   VLDL Cholesterol Cal 16 5 - 40 mg/dL   LDL Chol Calc (NIH) 144 (H) 0 - 99 mg/dL   Chol/HDL Ratio 3.6 0.0 - 4.4 ratio    Comment:                                   T. Chol/HDL Ratio                                             Men  Women                               1/2 Avg.Risk  3.4    3.3                                   Avg.Risk  5.0    4.4                                2X Avg.Risk  9.6    7.1                                3X Avg.Risk 23.4   11.0    No results found.  LIPID PANEL:    Component Value Date/Time   CHOL 221 (H) 07/06/2019 1004   TRIG 93 07/06/2019 1004   HDL 61 07/06/2019 1004   CHOLHDL 3.6 07/06/2019 1004   CHOLHDL 3 11/10/2018 1146   VLDL 28.0 11/10/2018 1146   LDLCALC 144 (H) 07/06/2019 1004     WEIGHTS: Wt Readings from Last 3 Encounters:  07/06/19 166 lb 6.4 oz (75.5 kg)  06/24/19 168 lb (76.2 kg)  04/07/19 164 lb (74.4 kg)    VITALS: BP (!) 138/91   Pulse 63   Ht 5\' 2"  (1.575 m)   Wt 166 lb 6.4 oz (75.5 kg)   LMP 08/02/2018 (Exact Date)   SpO2 97%   BMI 30.43 kg/m   EXAM: Deferred  EKG: NSR at 63,  NS ST changes - personally reviewed  ASSESSMENT: 1. Pleuritic chest and upper back pain 2. Low risk cardiac CT with CAC of 0, mild non-calcified plaque of the LAD 3. Dyslipidemia - goal LDL <70 4. Hypertension  PLAN: 1.   LDL remains elevated despite dietary modifications. Would recommend starting Crestor 20 mg daily. Plan repeat lipids in 3 months and follow-up after.  Renee Casino, MD, [facility], [place] Director of the Advanced Lipid Disorders &  Cardiovascular [facility] Reduction Clinic Diplomate of the American Board of Clinical Lipidology Attending Cardiologist   Direct Dial: [phone]  Fax: [phone]  Website:  [name] [name] 07/07/2019, 4:44 PM

## 2019-07-07 NOTE — Telephone Encounter (Signed)
-----   Message from Pixie Casino, MD sent at 07/07/2019 10:38 AM EST ----- Cholesterol remains elevated despite time to work on diete- would recommend starting Crestor 20 mg daily - repeat lipid in 3 months.  Dr Lemmie Evens

## 2019-07-09 MED FILL — ROSUVASTATIN CALCIUM 20 MG: 20 | 90 days supply | Qty: 90 | Fill #0

## 2019-07-20 ENCOUNTER — Other Ambulatory Visit: Payer: Self-pay

## 2019-07-20 ENCOUNTER — Ambulatory Visit
Admission: EM | Admit: 2019-07-20 | Discharge: 2019-07-20 | Disposition: A | Discharge status: Home / Self Care | Payer: PRIVATE HEALTH INSURANCE | Attending: Physician Assistant | Admitting: Physician Assistant

## 2019-07-20 ENCOUNTER — Ambulatory Visit (INDEPENDENT_AMBULATORY_CARE_PROVIDER_SITE_OTHER): Payer: PRIVATE HEALTH INSURANCE

## 2019-07-20 ENCOUNTER — Encounter: Payer: Self-pay | Admitting: Emergency Medicine

## 2019-07-20 DIAGNOSIS — M79672 Pain in left foot: Secondary | ICD-10-CM

## 2019-07-20 MED ORDER — MELOXICAM 7.5 MG PO TABS: 7.5000 mg | ORAL_TABLET | Freq: Every day | ORAL | 0 refills | Status: DC

## 2019-07-20 NOTE — ED Provider Notes (Signed)
[facility]    CSN: [id number] Arrival date & time: 07/20/19  1803      History   Chief Complaint Chief Complaint  Patient presents with  . Foot Pain    HPI [name] is a 48 y.o. female.   48 year old female comes in for left ankle pain after injury.  States was stepping off a curve, inverted ankle.  This was while working, and continue to work.  However, has had increased pain with weightbearing, and therefore came in for evaluation.  Pain is diffusely to the ankle, but worse at the fifth metatarsal.  Denies swelling, erythema, warmth.  Intermittent numbness, tingling.  Has not taken anything for the symptoms.     Past Medical History:  Diagnosis Date  . Anemia   . Anxiety   . ASCUS favoring benign 12/2015   negative high risk HPV  . Chicken pox   . GERD (gastroesophageal reflux disease)    during pregnancy   . 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 Hyperlipidemia   . Hypertension    during pregnancy   . 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 Reflux     Patient Active Problem List   Diagnosis Date Noted  . Aortic valve regurgitation 06/24/2019  . Urinary incontinence 06/24/2019  . Parapelvic renal cyst 06/24/2019  . Chronic shoulder bursitis, left 06/24/2019  . Arthritis 06/24/2019  . Essential hypertension 03/18/2019  . Mid back pain 03/18/2019  . Muscle weakness (generalized) 03/18/2019  . Pleuritic chest pain 03/18/2019  . Menorrhagia 08/10/2018  . Vitamin D deficiency 12/17/2017  . Iron deficiency anemia 11/13/2017  . Alopecia 11/03/2017  . Seborrheic dermatitis of scalp 11/03/2017  . Cyst of ovary 11/03/2017  . Atypical squamous cells of undetermined significance on cytologic smear of cervix (ASC-US) 11/03/2017  . Right foot pain 11/03/2017  . Thrombocytosis ([place]) 10/31/2017  . Leg edema, left 06/11/2013  . Petechiae 06/11/2013  . LGSIL (low grade squamous intraepithelial dysplasia)   . Reflux    . Hyperlipidemia 04/23/2007  . GAD (generalized anxiety disorder) 04/23/2007  . DEPRESSION 04/23/2007  . GERD 04/23/2007  . HYPERTENSION, GESTATIONAL 04/23/2007  . GESTATIONAL DIABETES 04/23/2007  . DE QUERVAIN'S TENOSYNOVITIS 04/23/2007    Past Surgical History:  Procedure Laterality Date  . COLPOSCOPY    . LAPAROSCOPIC VAGINAL HYSTERECTOMY WITH SALPINGO OOPHORECTOMY Bilateral 08/10/2018   Procedure: LAPAROSCOPIC ASSISTED VAGINAL HYSTERECTOMY WITH BILATERAL SALPINGO OOPHORECTOMY;  Surgeon: [name], MD;  Location: [facility];  Service: Gynecology;  Laterality: Bilateral;  . TONSILLECTOMY AND ADENOIDECTOMY      OB History    Gravida  2   Para  2   Term  2   Preterm      AB      Living  2     SAB      TAB      Ectopic      Multiple      Live Births               Home Medications    Prior to Admission medications   Medication Sig Start Date End Date Taking? Authorizing Provider  carvedilol (COREG) 3.125 MG tablet Take 1 tablet (3.125 mg total) by mouth 2 (two) times daily with a meal. 03/18/19   McLean-Scocuzza, Nino Glow, MD  cyclobenzaprine (FLEXERIL) 5 MG tablet Take 1 tablet (5 mg total) by mouth at bedtime as needed for muscle  spasms. 06/24/19   McLean-Scocuzza, Nino Glow, MD  meloxicam (MOBIC) 7.5 MG tablet Take 1 tablet (7.5 mg total) by mouth daily. 07/20/19   Tasia Catchings, Amy V, PA-C  predniSONE (DELTASONE) 20 MG tablet Take 1 tablet (20 mg total) by mouth daily with breakfast. 06/24/19   McLean-Scocuzza, Nino Glow, MD  rosuvastatin (CRESTOR) 20 MG tablet Take 1 tablet (20 mg total) by mouth daily. 07/07/19   Hilty, Nadean Corwin, MD  VITAMIN D PO Take by mouth.    [provider]    Family History Family History  Problem Relation Age of Onset  . Hypertension Mother   . Cancer Mother        LUNG CANCER/SMOKER  . Depression Mother   . Mental illness Mother   . Diabetes Father   . Hypertension Father   . Heart disease Father    . Hyperlipidemia Father   . Stroke Father   . Hypertension Brother   . Diabetes Brother   . Depression Brother   . Hyperlipidemia Brother   . Cancer Maternal Uncle        ESOPHAGEAL CA  . Diabetes Cousin   . Heart disease Cousin   . Cancer Maternal Grandfather        Brain  . Stroke Paternal Grandmother   . Depression Maternal Grandmother   . Mental retardation Maternal Grandmother   . Stroke Paternal Grandfather     Social History Social History   Tobacco Use  . Smoking status: Former Smoker    Types: Cigarettes    Quit date: 09/02/1990    Years since quitting: 28.8  . Smokeless tobacco: Never Used  . Tobacco comment: smoked socially in high school   Substance Use Topics  . Alcohol use: Not Currently    Alcohol/week: 0.0 standard drinks    Comment: occasionally   . Drug use: No     Allergies   Codeine, Erythromycin, and Levofloxacin   Review of Systems Review of Systems  Reason unable to perform ROS: See HPI as above.     Physical Exam Triage Vital Signs ED Triage Vitals [07/20/19 1825]  Enc Vitals Group     BP 134/86     Pulse Rate 79     Resp 16     Temp 98.3 F (36.8 C)     Temp Source Oral     SpO2 98 %     Weight      Height      Head Circumference      Peak Flow      Pain Score 10     Pain Loc      Pain Edu?      Excl. in [place]?    No data found.  Updated Vital Signs BP 134/86 (BP Location: Right Arm)   Pulse 79   Temp 98.3 F (36.8 C) (Oral)   Resp 16   LMP 08/02/2018 (Exact Date)   SpO2 98%   Visual Acuity Right Eye Distance:   Left Eye Distance:   Bilateral Distance:    Right Eye Near:   Left Eye Near:    Bilateral Near:     Physical Exam Constitutional:      General: She is not in acute distress.    Appearance: She is well-developed. She is not diaphoretic.  HENT:     Head: Normocephalic and atraumatic.  Eyes:     Conjunctiva/sclera: Conjunctivae normal.     Pupils: Pupils are equal, round, and reactive to light.  Pulmonary:     Effort: Pulmonary effort is normal. No respiratory distress.  Musculoskeletal:     Comments: No swelling, erythema, warmth, contusion.  No tenderness to palpation of left lower leg, ankle.  Point tenderness to palpation of mid fifth MTP.  Decreased range of motion of ankle due to pain.  Strength deferred.  Sensation intact ankle bilaterally.  Pedal pulse 2+.  Skin:    General: Skin is warm and dry.  Neurological:     Mental Status: She is alert and oriented to person, place, and time.      UC Treatments / Results  Labs (all labs ordered are listed, but only abnormal results are displayed) Labs Reviewed - No data to display  EKG   Radiology Dg Foot Complete Left  Result Date: 07/20/2019 CLINICAL DATA:  Inversion injury earlier today with pain. EXAM: LEFT FOOT - COMPLETE 3+ VIEW COMPARISON:  None. FINDINGS: No acute fracture or dislocation. Base of fifth metatarsal and talar dome intact. No significant soft tissue swelling. IMPRESSION: No acute osseous abnormality. Electronically Signed   By: Abigail Miyamoto M.D.   On: 07/20/2019 19:01    Procedures Procedures (including critical care time)  Medications Ordered in UC Medications - No data to display  Initial Impression / Assessment and Plan / UC Course  I have reviewed the triage vital signs and the nursing notes.  Pertinent labs & imaging results that were available during my care of the patient were reviewed by me and considered in my medical decision making (see chart for details).    X-ray negative for fracture or dislocation.  NSAIDs, ice compress, elevation, rest.  Cam walker and crutches for symptomatic relief.  Return precautions given.  Patient expresses understanding and agrees to plan.  Final Clinical Impressions(s) / UC Diagnoses   Final diagnoses:  Left foot pain   ED Prescriptions    Medication Sig Dispense Auth. Provider   meloxicam (MOBIC) 7.5 MG tablet Take 1 tablet (7.5 mg total) by mouth  daily. 10 tablet Ok Edwards, PA-C     PDMP not reviewed this encounter.   Ok Edwards, PA-C 07/20/19 2031

## 2019-07-20 NOTE — ED Triage Notes (Signed)
APP assessment occurred prior to RN triage

## 2019-07-20 NOTE — Discharge Instructions (Signed)
X-ray negative for fracture or dislocation.  You can start prednisone to help with the inflammation.  Otherwise take ibuprofen/Tylenol to help with pain.  Ice compress.  Cam walker and crutches to help with symptomatic relief.  This may take a few weeks to completely resolve, but should be feeling better each week.  If symptoms not improving, follow-up with orthopedic for further evaluation and management needed.

## 2019-07-20 NOTE — ED Notes (Signed)
Patient able to ambulate independently  

## 2019-07-22 ENCOUNTER — Other Ambulatory Visit: Payer: Self-pay

## 2019-07-22 ENCOUNTER — Ambulatory Visit: Payer: Self-pay

## 2019-07-22 ENCOUNTER — Other Ambulatory Visit: Payer: Self-pay | Admitting: Family Medicine

## 2019-07-22 DIAGNOSIS — M25572 Pain in left ankle and joints of left foot: Secondary | ICD-10-CM

## 2019-08-16 MED FILL — predniSONE 10 MG TABS: 10 | 6 days supply | Qty: 21 | Fill #0

## 2019-08-16 MED FILL — MELOXICAM 15 MG TABLET: 15 | 30 days supply | Qty: 30 | Fill #0

## 2019-08-19 ENCOUNTER — Ambulatory Visit: Payer: PRIVATE HEALTH INSURANCE | Attending: Orthopaedic Surgery | Admitting: Physical Therapy

## 2019-08-19 ENCOUNTER — Ambulatory Visit: Payer: PRIVATE HEALTH INSURANCE | Admitting: Physical Therapy

## 2019-08-19 ENCOUNTER — Encounter: Payer: Self-pay | Admitting: Physical Therapy

## 2019-08-19 ENCOUNTER — Other Ambulatory Visit: Payer: Self-pay

## 2019-08-19 DIAGNOSIS — M25672 Stiffness of left ankle, not elsewhere classified: Secondary | ICD-10-CM | POA: Insufficient documentation

## 2019-08-19 DIAGNOSIS — M25572 Pain in left ankle and joints of left foot: Secondary | ICD-10-CM | POA: Insufficient documentation

## 2019-08-19 DIAGNOSIS — M25552 Pain in left hip: Secondary | ICD-10-CM | POA: Diagnosis present

## 2019-08-19 DIAGNOSIS — R600 Localized edema: Secondary | ICD-10-CM | POA: Insufficient documentation

## 2019-08-19 DIAGNOSIS — R262 Difficulty in walking, not elsewhere classified: Secondary | ICD-10-CM | POA: Insufficient documentation
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
## 2019-08-24 ENCOUNTER — Ambulatory Visit: Payer: PRIVATE HEALTH INSURANCE | Admitting: Physical Therapy

## 2019-08-25 ENCOUNTER — Telehealth: Payer: Self-pay | Admitting: Internal Medicine

## 2019-08-25 NOTE — Telephone Encounter (Signed)
The health dept and Health at work told her to contact PCP for underlying health conditions. Pt tested positive for Covid on 08/25/2019. Would like Fransisco Beau to give her a call back.

## 2019-08-26 ENCOUNTER — Telehealth: Payer: Self-pay | Admitting: Internal Medicine

## 2019-08-26 NOTE — Telephone Encounter (Signed)
Patient was informed.  Patient understood and no questions, comments, or concerns at this time.  

## 2019-08-26 NOTE — Telephone Encounter (Signed)
We have been now using steroids in covid patients if she gets worse with cough, breathing try this  [name]

## 2019-08-26 NOTE — Telephone Encounter (Signed)
I spoke with patient & advised. She said that she would start if she gets a bad cough or has any breathing issues. I aslo advised ED over the next few days while office is closed if she experiencing worsening symptoms or SOB. Patient was agreeable to this.

## 2019-08-26 NOTE — Telephone Encounter (Signed)
Patient wanted to make Dr.Hilty aware that she tested positive for Covid-19.

## 2019-08-26 NOTE — Telephone Encounter (Signed)
How is she doing  If not well will need f/u appt for covid  Rec vitamin D3 4000 daily at least no more than 5000  Vitamin C 1000 mg daily  Zinc 100 mg daily  Mucinex DM for cough  Tylenol as needed if not helping body aches/fever advil   Increase fluids and rest  Cough drops otc  Will need to stay in home x 14 days and all of her contacts as well

## 2019-08-30 ENCOUNTER — Telehealth: Payer: Self-pay | Admitting: Internal Medicine

## 2019-08-30 NOTE — Telephone Encounter (Signed)
Pt called and would like Fransisco Beau to call her back about what health at work told her  Pt is still having side pain,cough

## 2019-08-31 ENCOUNTER — Ambulatory Visit: Payer: PRIVATE HEALTH INSURANCE | Admitting: Physical Therapy

## 2019-08-31 NOTE — Telephone Encounter (Signed)
Patient still having sx of covid. When she breaths in through nose she feels like air is super cold when breathing in, when it isnt. Patient is still tired. She is no longer on stay home orders from health at work.she will keep [place] updated.

## 2019-09-01 ENCOUNTER — Other Ambulatory Visit: Payer: Self-pay | Admitting: Internal Medicine

## 2019-09-01 DIAGNOSIS — R059 Cough, unspecified: Secondary | ICD-10-CM

## 2019-09-01 DIAGNOSIS — R05 Cough: Secondary | ICD-10-CM

## 2019-09-01 DIAGNOSIS — U071 COVID-19: Secondary | ICD-10-CM

## 2019-09-01 MED ORDER — ALBUTEROL SULFATE HFA 108 (90 BASE) MCG/ACT IN AERS: 1.0000 | INHALATION_SPRAY | Freq: Four times a day (QID) | RESPIRATORY_TRACT | 2 refills | Status: DC | PRN

## 2019-09-01 MED FILL — predniSONE 10 MG TABS: 10 | 6 days supply | Qty: 21 | Fill #0

## 2019-09-01 NOTE — Telephone Encounter (Signed)
I rec out of work x 2 weeks 14 days  Has it been?  Does she have an inhaler ?  Rec vitamin D3 5000 IU daily  Vitamin C 1000 mg daily  Zinc 100 mg daily  Querecetin 500 mg 2x per day

## 2019-09-01 NOTE — Telephone Encounter (Signed)
Patient was informed. Patients wants inhaler sent to [facility] in [place]  on [address],

## 2019-09-02 ENCOUNTER — Ambulatory Visit: Payer: PRIVATE HEALTH INSURANCE | Admitting: Physical Therapy

## 2019-09-07 ENCOUNTER — Other Ambulatory Visit: Payer: Self-pay

## 2019-09-07 ENCOUNTER — Encounter: Payer: Self-pay | Admitting: Physical Therapy

## 2019-09-07 ENCOUNTER — Ambulatory Visit: Payer: PRIVATE HEALTH INSURANCE | Attending: Orthopaedic Surgery | Admitting: Physical Therapy

## 2019-09-07 DIAGNOSIS — R600 Localized edema: Secondary | ICD-10-CM | POA: Insufficient documentation

## 2019-09-07 DIAGNOSIS — M25572 Pain in left ankle and joints of left foot: Secondary | ICD-10-CM | POA: Diagnosis present

## 2019-09-07 DIAGNOSIS — R262 Difficulty in walking, not elsewhere classified: Secondary | ICD-10-CM | POA: Insufficient documentation

## 2019-09-07 DIAGNOSIS — M25552 Pain in left hip: Secondary | ICD-10-CM | POA: Diagnosis present

## 2019-09-07 DIAGNOSIS — M25672 Stiffness of left ankle, not elsewhere classified: Secondary | ICD-10-CM | POA: Diagnosis present

## 2019-09-07 NOTE — Therapy (Signed)
[place], [place], [address] Phone: [phone]   Fax:  [phone]  Physical Therapy Treatment  Patient Details  Name: [name] MRN: [id number] Date of Birth: Jan 06, 1971 Referring Provider (PT): Pershing Cox Date: 09/07/2019  PT End of Session - 09/07/19 1438    Visit Number  2    Number of Visits  9    Authorization Type  W/C    PT Start Time  0263    PT Stop Time  1428    PT Time Calculation (min)  67 min    Activity Tolerance  Patient tolerated treatment well    Behavior During Therapy  [facility] for tasks assessed/performed       Past Medical History:  Diagnosis Date  . Anemia   . Anxiety   . ASCUS favoring benign 12/2015   negative high risk HPV  . Chicken pox   . GERD (gastroesophageal reflux disease)    during pregnancy   . 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 Hyperlipidemia   . Hypertension    during pregnancy   . 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 Reflux     Past Surgical History:  Procedure Laterality Date  . COLPOSCOPY    . LAPAROSCOPIC VAGINAL HYSTERECTOMY WITH SALPINGO OOPHORECTOMY Bilateral 08/10/2018   Procedure: LAPAROSCOPIC ASSISTED VAGINAL HYSTERECTOMY WITH BILATERAL SALPINGO OOPHORECTOMY;  Surgeon: [name], MD;  Location: [facility];  Service: Gynecology;  Laterality: Bilateral;  . TONSILLECTOMY AND ADENOIDECTOMY      There were no vitals filed for this visit.  Subjective Assessment - 09/07/19 1331    Subjective  Patient was diagnosed with Covid 08/24/19.  She has had symptoms until last week.  She reports that she has tried to continue the HEP but is having some heel and in the lateral foot and ankle    Currently in Pain?  Yes    Pain Score  5     Pain Location  Ankle   still reporting the left HS and buttock pain   Pain Orientation  Left    Aggravating Factors   walking and certain  mvements increase the ankle and foot pain, the walking is causing some of the left buttock pain with her in the boot                       [facility] Adult PT Treatment/Exercise - 09/07/19 0001      Ambulation/Gait   Gait Comments  still in cam boot walking, walking faster, still limping      Exercises   Exercises  Ankle      Modalities   Modalities  Electrical Stimulation;Moist Heat;Vasopneumatic      Moist Heat Therapy   Number Minutes Moist Heat  15 Minutes    Moist Heat Location  Lumbar Spine;Hip      Electrical Stimulation   Electrical Stimulation Location  left buttock    Electrical Stimulation Action  --   FC   Electrical Stimulation Parameters  supine    Electrical Stimulation Goals  Pain      Manual Therapy   Manual Therapy  Soft tissue mobilization;Passive ROM    Soft tissue mobilization  to the left PF area, very sore and tender here, mid foot and toe mobilizations    Passive ROM  PROMleft ankle, passive stretch all the motions,  passive HS and piriformis stretches      Ankle Exercises: Stretches   Plantar Fascia Stretch  10 seconds;5 reps    Gastroc Stretch  5 reps;10 seconds      Ankle Exercises: Seated   Towel Crunch Limitations  20 reps    Marble Pickup  10x      Ankle Exercises: Supine   T-Band  all tband red 3x10 with a lot of cues and assist to keep from compensation               PT Short Term Goals - 09/07/19 1440      PT SHORT TERM GOAL #1   Title  She will be independent with inital HEP.     Status  Partially Met        PT Long Term Goals - 09/07/19 1440      PT LONG TERM GOAL #2   Title  report buttock pain 50% better    Status  On-going            Plan - 09/07/19 1438    Clinical Impression Statement  Patient has not been in for 3 weeks due to contracting covid.  She is still very tender and tight in the calf and the foot, the PF is very tight and tender, she is having issues with the left buttock and the back  possibly due to walking in the boot.  Toe extension was very painful, needs a lot of cues to stay on task.    PT Next Visit Plan  try to get her moving    Consulted and Agree with Plan of Care  Patient       Patient will benefit from skilled therapeutic intervention in order to improve the following deficits and impairments:  Abnormal gait, Improper body mechanics, Pain, Increased muscle spasms, Decreased mobility, Decreased activity tolerance, Decreased endurance, Decreased range of motion, Decreased strength, Impaired flexibility, Increased edema, Difficulty walking  Visit Diagnosis: Pain in left ankle and joints of left foot  Difficulty in walking, not elsewhere classified  Stiffness of left ankle, not elsewhere classified  Localized edema  Pain in left hip     Problem List Patient Active Problem List   Diagnosis Date Noted  . Aortic valve regurgitation 06/24/2019  . Urinary incontinence 06/24/2019  . Parapelvic renal cyst 06/24/2019  . Chronic shoulder bursitis, left 06/24/2019  . Arthritis 06/24/2019  . Essential hypertension 03/18/2019  . Mid back pain 03/18/2019  . Muscle weakness (generalized) 03/18/2019  . Pleuritic chest pain 03/18/2019  . Menorrhagia 08/10/2018  . Vitamin D deficiency 12/17/2017  . Iron deficiency anemia 11/13/2017  . Alopecia 11/03/2017  . Seborrheic dermatitis of scalp 11/03/2017  . Cyst of ovary 11/03/2017  . Atypical squamous cells of undetermined significance on cytologic smear of cervix (ASC-US) 11/03/2017  . Right foot pain 11/03/2017  . Thrombocytosis ([place]) 10/31/2017  . Leg edema, left 06/11/2013  . Petechiae 06/11/2013  . LGSIL (low grade squamous intraepithelial dysplasia)   . Reflux   . Hyperlipidemia 04/23/2007  . GAD (generalized anxiety disorder) 04/23/2007  . DEPRESSION 04/23/2007  . GERD 04/23/2007  . HYPERTENSION, GESTATIONAL 04/23/2007  . GESTATIONAL DIABETES 04/23/2007  . DE QUERVAIN'S TENOSYNOVITIS 04/23/2007     Sumner Boast., PT 09/07/2019, 2:41 PM  [address] [place], [place], [address] Phone: [phone]   Fax:  [phone]  Name: [name] MRN: [id number] Date of Birth: Dec 30, 1970

## 2019-09-08 ENCOUNTER — Ambulatory Visit: Payer: PRIVATE HEALTH INSURANCE | Admitting: Physical Therapy

## 2019-09-08 ENCOUNTER — Encounter: Payer: Self-pay | Admitting: Physical Therapy

## 2019-09-08 DIAGNOSIS — M25572 Pain in left ankle and joints of left foot: Secondary | ICD-10-CM | POA: Diagnosis not present

## 2019-09-08 DIAGNOSIS — R262 Difficulty in walking, not elsewhere classified: Secondary | ICD-10-CM

## 2019-09-08 DIAGNOSIS — M25672 Stiffness of left ankle, not elsewhere classified: Secondary | ICD-10-CM

## 2019-09-08 DIAGNOSIS — R600 Localized edema: Secondary | ICD-10-CM

## 2019-09-08 DIAGNOSIS — M25552 Pain in left hip: Secondary | ICD-10-CM

## 2019-09-08 NOTE — Therapy (Signed)
[place], [place], [address] Phone: [phone]   Fax:  [phone]  Physical Therapy Treatment  Patient Details  Name: [name] MRN: [id number] Date of Birth: 1971-05-23 Referring Provider (PT): Pershing Cox Date: 09/08/2019  PT End of Session - 09/08/19 1406    Visit Number  3    Number of Visits  9    Authorization Type  W/C    PT Start Time  3419    PT Stop Time  1417    PT Time Calculation (min)  62 min    Activity Tolerance  Patient tolerated treatment well    Behavior During Therapy  [facility] for tasks assessed/performed       Past Medical History:  Diagnosis Date  . Anemia   . Anxiety   . ASCUS favoring benign 12/2015   negative high risk HPV  . Chicken pox   . GERD (gastroesophageal reflux disease)    during pregnancy   . 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 Hyperlipidemia   . Hypertension    during pregnancy   . 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 Reflux     Past Surgical History:  Procedure Laterality Date  . COLPOSCOPY    . LAPAROSCOPIC VAGINAL HYSTERECTOMY WITH SALPINGO OOPHORECTOMY Bilateral 08/10/2018   Procedure: LAPAROSCOPIC ASSISTED VAGINAL HYSTERECTOMY WITH BILATERAL SALPINGO OOPHORECTOMY;  Surgeon: [name], MD;  Location: [facility];  Service: Gynecology;  Laterality: Bilateral;  . TONSILLECTOMY AND ADENOIDECTOMY      There were no vitals filed for this visit.  Subjective Assessment - 09/08/19 1319    Subjective  "Overall good" Pt reports some soreness in her L heel.    Currently in Pain?  Yes    Pain Score  6     Pain Location  Foot    Pain Orientation  --   bottom                      [facility] Adult PT Treatment/Exercise - 09/08/19 0001      Modalities   Modalities  Electrical Stimulation;Moist Heat;Vasopneumatic      Moist Heat Therapy   Number Minutes Moist  Heat  15 Minutes    Moist Heat Location  Lumbar Spine;Hip      Electrical Stimulation   Electrical Stimulation Location  left buttock    Electrical Stimulation Parameters  supine    Electrical Stimulation Goals  Pain      Manual Therapy   Manual Therapy  Soft tissue mobilization;Passive ROM    Soft tissue mobilization  to the left PF area, very sore and tender here, mid foot and toe mobilizations    Passive ROM  PROMleft ankle, passive stretch all the motions, passive HS and piriformis stretches      Ankle Exercises: Aerobic   Recumbent Bike  L0 x 5 min       Ankle Exercises: Standing   Other Standing Ankle Exercises  Standing march with UE assist 2x10, heel raises 2x10     Other Standing Ankle Exercises  resisted ankle PF,DF eversion green 2x10       Ankle Exercises: Machines for Strengthening   Cybex Leg Press  20lb 2x10                PT Short Term Goals - 09/07/19 1440  PT SHORT TERM GOAL #1   Title  She will be independent with inital HEP.     Status  Partially Met        PT Long Term Goals - 09/07/19 1440      PT LONG TERM GOAL #2   Title  report buttock pain 50% better    Status  On-going            Plan - 09/08/19 1407    Clinical Impression Statement  Pt amb into clinic without boot on LLE. She reports pain in her heel and arch of her L foot. Passive L ankle DF is very stiff. Some tenderness in the calf muscle with STM. Some R foot pain reported with the last few reps on leg press    Stability/Clinical Decision Making  Stable/Uncomplicated    Rehab Potential  Good    PT Frequency  2x / week    PT Duration  4 weeks    PT Treatment/Interventions  ADLs/Self Care Home Management;Cryotherapy;Electrical Stimulation;Moist Heat;Ultrasound;DME Instruction;Therapeutic activities;Therapeutic exercise;Balance training;Neuromuscular re-education;Manual techniques;Patient/family education;Vasopneumatic Device    PT Next Visit Plan  Ankle stability and ROM.        Patient will benefit from skilled therapeutic intervention in order to improve the following deficits and impairments:  Abnormal gait, Improper body mechanics, Pain, Increased muscle spasms, Decreased mobility, Decreased activity tolerance, Decreased endurance, Decreased range of motion, Decreased strength, Impaired flexibility, Increased edema, Difficulty walking  Visit Diagnosis: Localized edema  Pain in left hip  Stiffness of left ankle, not elsewhere classified  Difficulty in walking, not elsewhere classified  Pain in left ankle and joints of left foot     Problem List Patient Active Problem List   Diagnosis Date Noted  . Aortic valve regurgitation 06/24/2019  . Urinary incontinence 06/24/2019  . Parapelvic renal cyst 06/24/2019  . Chronic shoulder bursitis, left 06/24/2019  . Arthritis 06/24/2019  . Essential hypertension 03/18/2019  . Mid back pain 03/18/2019  . Muscle weakness (generalized) 03/18/2019  . Pleuritic chest pain 03/18/2019  . Menorrhagia 08/10/2018  . Vitamin D deficiency 12/17/2017  . Iron deficiency anemia 11/13/2017  . Alopecia 11/03/2017  . Seborrheic dermatitis of scalp 11/03/2017  . Cyst of ovary 11/03/2017  . Atypical squamous cells of undetermined significance on cytologic smear of cervix (ASC-US) 11/03/2017  . Right foot pain 11/03/2017  . Thrombocytosis ([place]) 10/31/2017  . Leg edema, left 06/11/2013  . Petechiae 06/11/2013  . LGSIL (low grade squamous intraepithelial dysplasia)   . Reflux   . Hyperlipidemia 04/23/2007  . GAD (generalized anxiety disorder) 04/23/2007  . DEPRESSION 04/23/2007  . GERD 04/23/2007  . HYPERTENSION, GESTATIONAL 04/23/2007  . GESTATIONAL DIABETES 04/23/2007  . DE QUERVAIN'S TENOSYNOVITIS 04/23/2007    Scot Jun, PTA 09/08/2019, 2:11 PM  [place] [address] [place], [place], [address] Phone: [phone]   Fax:   [phone]  Name: [name] MRN: [id number] Date of Birth: [birth year]

## 2019-09-09 ENCOUNTER — Ambulatory Visit: Payer: No Typology Code available for payment source | Admitting: Physical Therapy

## 2019-09-13 ENCOUNTER — Other Ambulatory Visit: Payer: Self-pay

## 2019-09-13 ENCOUNTER — Encounter: Payer: Self-pay | Admitting: Physical Therapy

## 2019-09-13 ENCOUNTER — Ambulatory Visit: Payer: PRIVATE HEALTH INSURANCE | Attending: Orthopaedic Surgery | Admitting: Physical Therapy

## 2019-09-13 DIAGNOSIS — M25572 Pain in left ankle and joints of left foot: Secondary | ICD-10-CM | POA: Diagnosis present

## 2019-09-13 DIAGNOSIS — M25552 Pain in left hip: Secondary | ICD-10-CM | POA: Insufficient documentation

## 2019-09-13 DIAGNOSIS — R600 Localized edema: Secondary | ICD-10-CM | POA: Diagnosis present

## 2019-09-13 DIAGNOSIS — M25672 Stiffness of left ankle, not elsewhere classified: Secondary | ICD-10-CM | POA: Insufficient documentation

## 2019-09-13 DIAGNOSIS — R262 Difficulty in walking, not elsewhere classified: Secondary | ICD-10-CM | POA: Insufficient documentation

## 2019-09-13 NOTE — Therapy (Signed)
[place], [place], [address] Phone: [phone]   Fax:  [phone]  Physical Therapy Treatment  Patient Details  Name: [name] MRN: [id number] Date of Birth: 08-21-1971 Referring Provider (PT): Pershing Cox Date: 09/13/2019  PT End of Session - 09/13/19 1344    Visit Number  4    Authorization Type  W/C    PT Start Time  9024    PT Stop Time  1358    PT Time Calculation (min)  41 min    Activity Tolerance  Patient limited by pain    Behavior During Therapy  [facility] for tasks assessed/performed       Past Medical History:  Diagnosis Date  . Anemia   . Anxiety   . ASCUS favoring benign 12/2015   negative high risk HPV  . Chicken pox   . GERD (gastroesophageal reflux disease)    during pregnancy   . 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 Hyperlipidemia   . Hypertension    during pregnancy   . 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 Reflux     Past Surgical History:  Procedure Laterality Date  . COLPOSCOPY    . LAPAROSCOPIC VAGINAL HYSTERECTOMY WITH SALPINGO OOPHORECTOMY Bilateral 08/10/2018   Procedure: LAPAROSCOPIC ASSISTED VAGINAL HYSTERECTOMY WITH BILATERAL SALPINGO OOPHORECTOMY;  Surgeon: [name], MD;  Location: [facility];  Service: Gynecology;  Laterality: Bilateral;  . TONSILLECTOMY AND ADENOIDECTOMY      There were no vitals filed for this visit.  Subjective Assessment - 09/13/19 1319    Subjective  "I messed myself up Saturday" Pt reports carrying a  laundry basket and other items up and down stairs. Now she reports increase L quad and HS soreness    Patient Stated Goals  walk better, have less pain, no hip pain    Currently in Pain?  No/denies    Pain Score  5     Pain Location  Leg    Pain Orientation  Left                       [facility] Adult PT Treatment/Exercise - 09/13/19 0001       Modalities   Modalities  Cryotherapy;Electrical Stimulation;Moist Heat      Moist Heat Therapy   Number Minutes Moist Heat  15 Minutes    Moist Heat Location  Knee      Cryotherapy   Number Minutes Cryotherapy  15 Minutes    Cryotherapy Location  Ankle    Type of Cryotherapy  Ice pack      Electrical Stimulation   Electrical Stimulation Location  left buttock    Electrical Stimulation Action  IFC    Electrical Stimulation Parameters  supine    Electrical Stimulation Goals  Pain      Manual Therapy   Manual Therapy  Soft tissue mobilization;Passive ROM    Soft tissue mobilization  To L quad, HS, and glute    Passive ROM  PROMleft ankle, passive stretch all the motions, passive HS and piriformis stretches      Ankle Exercises: Aerobic   Recumbent Bike  L0 x 5 min       Ankle Exercises: Machines for Strengthening   Cybex Leg Press  20lb 2x10       Ankle Exercises: Clinical research associate  5  reps;10 seconds               PT Short Term Goals - 09/07/19 1440      PT SHORT TERM GOAL #1   Title  She will be independent with inital HEP.     Status  Partially Met        PT Long Term Goals - 09/07/19 1440      PT LONG TERM GOAL #2   Title  report buttock pain 50% better    Status  On-going            Plan - 09/13/19 1346    Clinical Impression Statement  Pt ~ 17 minutes late for today's session. She was  limited by pain in the L glute, HS and quad area from carrying items up and down stairs Saturday. Pain ant tenderness reported with STM to HS area. Limited L ankle DF notes with PROM. Modalities to help with pain    Stability/Clinical Decision Making  Stable/Uncomplicated    Rehab Potential  Good    PT Duration  4 weeks    PT Treatment/Interventions  ADLs/Self Care Home Management;Cryotherapy;Electrical Stimulation;Moist Heat;Ultrasound;DME Instruction;Therapeutic activities;Therapeutic exercise;Balance training;Neuromuscular re-education;Manual  techniques;Patient/family education;Vasopneumatic Device    PT Next Visit Plan  Ankle stability and ROM.       Patient will benefit from skilled therapeutic intervention in order to improve the following deficits and impairments:  Abnormal gait, Improper body mechanics, Pain, Increased muscle spasms, Decreased mobility, Decreased activity tolerance, Decreased endurance, Decreased range of motion, Decreased strength, Impaired flexibility, Increased edema, Difficulty walking  Visit Diagnosis: Stiffness of left ankle, not elsewhere classified  Pain in left hip  Localized edema  Pain in left ankle and joints of left foot  Difficulty in walking, not elsewhere classified     Problem List Patient Active Problem List   Diagnosis Date Noted  . Aortic valve regurgitation 06/24/2019  . Urinary incontinence 06/24/2019  . Parapelvic renal cyst 06/24/2019  . Chronic shoulder bursitis, left 06/24/2019  . Arthritis 06/24/2019  . Essential hypertension 03/18/2019  . Mid back pain 03/18/2019  . Muscle weakness (generalized) 03/18/2019  . Pleuritic chest pain 03/18/2019  . Menorrhagia 08/10/2018  . Vitamin D deficiency 12/17/2017  . Iron deficiency anemia 11/13/2017  . Alopecia 11/03/2017  . Seborrheic dermatitis of scalp 11/03/2017  . Cyst of ovary 11/03/2017  . Atypical squamous cells of undetermined significance on cytologic smear of cervix (ASC-US) 11/03/2017  . Right foot pain 11/03/2017  . Thrombocytosis ([place]) 10/31/2017  . Leg edema, left 06/11/2013  . Petechiae 06/11/2013  . LGSIL (low grade squamous intraepithelial dysplasia)   . Reflux   . Hyperlipidemia 04/23/2007  . GAD (generalized anxiety disorder) 04/23/2007  . DEPRESSION 04/23/2007  . GERD 04/23/2007  . HYPERTENSION, GESTATIONAL 04/23/2007  . GESTATIONAL DIABETES 04/23/2007  . DE QUERVAIN'S TENOSYNOVITIS 04/23/2007    Scot Jun, PTA 09/13/2019, 1:53 PM  [place] [address] [place], [place], [address] Phone: [phone]   Fax:  [phone]  Name: [name] MRN: [id number] Date of Birth: 05/03/1971

## 2019-09-14 ENCOUNTER — Ambulatory Visit: Payer: PRIVATE HEALTH INSURANCE | Attending: Orthopaedic Surgery | Admitting: Physical Therapy

## 2019-09-14 ENCOUNTER — Encounter: Payer: Self-pay | Admitting: Physical Therapy

## 2019-09-14 DIAGNOSIS — R262 Difficulty in walking, not elsewhere classified: Secondary | ICD-10-CM | POA: Diagnosis present

## 2019-09-14 DIAGNOSIS — M25572 Pain in left ankle and joints of left foot: Secondary | ICD-10-CM | POA: Diagnosis present

## 2019-09-14 DIAGNOSIS — M25552 Pain in left hip: Secondary | ICD-10-CM | POA: Diagnosis present

## 2019-09-14 DIAGNOSIS — R600 Localized edema: Secondary | ICD-10-CM | POA: Insufficient documentation

## 2019-09-14 DIAGNOSIS — M25672 Stiffness of left ankle, not elsewhere classified: Secondary | ICD-10-CM | POA: Diagnosis present

## 2019-09-14 NOTE — Therapy (Signed)
[place], [place], [address] Phone: [phone]   Fax:  [phone]  Physical Therapy Treatment  Patient Details  Name: [name] MRN: [id number] Date of Birth: 1970/10/18 Referring Provider (PT): Pershing Cox Date: 09/14/2019  PT End of Session - 09/14/19 1325    Visit Number  5    Number of Visits  9    Authorization Type  W/C    PT Start Time  5809   in late   PT Stop Time  1420    PT Time Calculation (min)  54 min    Activity Tolerance  Patient limited by pain    Behavior During Therapy  Anxious       Past Medical History:  Diagnosis Date  . Anemia   . Anxiety   . ASCUS favoring benign 12/2015   negative high risk HPV  . Chicken pox   . GERD (gastroesophageal reflux disease)    during pregnancy   . 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 Hyperlipidemia   . Hypertension    during pregnancy   . 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 Reflux     Past Surgical History:  Procedure Laterality Date  . COLPOSCOPY    . LAPAROSCOPIC VAGINAL HYSTERECTOMY WITH SALPINGO OOPHORECTOMY Bilateral 08/10/2018   Procedure: LAPAROSCOPIC ASSISTED VAGINAL HYSTERECTOMY WITH BILATERAL SALPINGO OOPHORECTOMY;  Surgeon: [name], MD;  Location: [facility];  Service: Gynecology;  Laterality: Bilateral;  . TONSILLECTOMY AND ADENOIDECTOMY      There were no vitals filed for this visit.  Subjective Assessment - 09/14/19 1326    Subjective  pt reports pain in medial Lt knee after this past weekend. Sees MD tomorrow    Patient Stated Goals  walk better, have less pain, no hip pain    Currently in Pain?  Yes    Pain Score  5     Pain Location  Knee  Also has pain in the Lt calf and buttocks.    Pain Orientation  Left;Medial    Pain Descriptors / Indicators  Aching;Sore    Pain Type  Acute pain    Pain Onset  In the past 7 days    Pain  Frequency  Constant                       [facility] Adult PT Treatment/Exercise - 09/14/19 0001      Self-Care   Self-Care  Other Self-Care Comments    Other Self-Care Comments   explained to patient that with her fall it is possible that other areas on the side may have been pulled and that  she may be walking differently d/t ankle pain and that can cause pain /tightness up the leg.       Modalities   Modalities  Electrical Stimulation;Moist Heat;Ultrasound      Moist Heat Therapy   Number Minutes Moist Heat  15 Minutes    Moist Heat Location  --   Lt buttock, knee and calf     Electrical Stimulation   Electrical Stimulation Location  Lt buttock , calf     Electrical Stimulation Action  premod    Electrical Stimulation Parameters  supine    Electrical Stimulation Goals  Pain;Tone      Ultrasound   Ultrasound Location  Lt lateral gastroc    Ultrasound  Parameters  100% 1.5 w/cm2, 1.60mz    Ultrasound Goals  Pain;Other (Comment)   tone     Manual Therapy   Manual Therapy  Soft tissue mobilization    Soft tissue mobilization  STM to Lt lateral gastroc to loosen up banding with passive releases and trigger point pressure.  Palpated Lt VMO pt with some tightness/trigger points there as well.       Ankle Exercises: Aerobic   Recumbent Bike  L4x5' LE only       Ankle Exercises: Stretches   Gastroc Stretch  --   hamstring and gastroc supine with strap   Other Stretch  Lt piriformis and ITB stretching in supine,                PT Short Term Goals - 09/07/19 1440      PT SHORT TERM GOAL #1   Title  She will be independent with inital HEP.     Status  Partially Met        PT Long Term Goals - 09/07/19 1440      PT LONG TERM GOAL #2   Title  report buttock pain 50% better    Status  On-going            Plan - 09/14/19 1410    Clinical Impression Statement  Pt very anxious about her pain and symptoms.  Expressed a lot of concern wondering if  she has a blood clot.  She has a negative holmans sign Lt calf.  Does have a lot of palpable tightness and banding in the Lt gastroc soleous, VMO and piriformis/gluts.  She has a low tolerance to deep tissue work to release these muscles during manual therapy.  Progress may be slower d/t her low tolerance and anxiety over possible other medical issues that she could get.    Rehab Potential  Good    PT Frequency  2x / week    PT Duration  4 weeks    PT Treatment/Interventions  ADLs/Self Care Home Management;Cryotherapy;Electrical Stimulation;Moist Heat;Ultrasound;DME Instruction;Therapeutic activities;Therapeutic exercise;Balance training;Neuromuscular re-education;Manual techniques;Patient/family education;Vasopneumatic Device    PT Next Visit Plan  Ankle stability and ROM. encourage self massage to tightness - she states she does not have a ball or rolling pin she could use at home.    Consulted and Agree with Plan of Care  Patient       Patient will benefit from skilled therapeutic intervention in order to improve the following deficits and impairments:  Abnormal gait, Improper body mechanics, Pain, Increased muscle spasms, Decreased mobility, Decreased activity tolerance, Decreased endurance, Decreased range of motion, Decreased strength, Impaired flexibility, Increased edema, Difficulty walking  Visit Diagnosis: Stiffness of left ankle, not elsewhere classified  Pain in left hip  Localized edema  Pain in left ankle and joints of left foot  Difficulty in walking, not elsewhere classified     Problem List Patient Active Problem List   Diagnosis Date Noted  . Aortic valve regurgitation 06/24/2019  . Urinary incontinence 06/24/2019  . Parapelvic renal cyst 06/24/2019  . Chronic shoulder bursitis, left 06/24/2019  . Arthritis 06/24/2019  . Essential hypertension 03/18/2019  . Mid back pain 03/18/2019  . Muscle weakness (generalized) 03/18/2019  . Pleuritic chest pain 03/18/2019   . Menorrhagia 08/10/2018  . Vitamin D deficiency 12/17/2017  . Iron deficiency anemia 11/13/2017  . Alopecia 11/03/2017  . Seborrheic dermatitis of scalp 11/03/2017  . Cyst of ovary 11/03/2017  . Atypical squamous cells of undetermined significance  on cytologic smear of cervix (ASC-US) 11/03/2017  . Right foot pain 11/03/2017  . Thrombocytosis ([place]) 10/31/2017  . Leg edema, left 06/11/2013  . Petechiae 06/11/2013  . LGSIL (low grade squamous intraepithelial dysplasia)   . Reflux   . Hyperlipidemia 04/23/2007  . GAD (generalized anxiety disorder) 04/23/2007  . DEPRESSION 04/23/2007  . GERD 04/23/2007  . HYPERTENSION, GESTATIONAL 04/23/2007  . GESTATIONAL DIABETES 04/23/2007  . DE QUERVAIN'S TENOSYNOVITIS 04/23/2007    Jeral Pinch PT  09/14/2019, 2:22 PM  [place] [address] [place], [place], [address] Phone: [phone]   Fax:  [phone]  Name: [name] MRN: [id number] Date of Birth: 1970/09/18

## 2019-09-15 MED FILL — MELOXICAM 15 MG TABLET: 15 | 30 days supply | Qty: 30 | Fill #0

## 2019-09-16 ENCOUNTER — Other Ambulatory Visit: Payer: Self-pay

## 2019-09-16 ENCOUNTER — Ambulatory Visit: Payer: PRIVATE HEALTH INSURANCE | Admitting: Physical Therapy

## 2019-09-16 ENCOUNTER — Encounter: Payer: Self-pay | Admitting: Physical Therapy

## 2019-09-16 DIAGNOSIS — M25672 Stiffness of left ankle, not elsewhere classified: Secondary | ICD-10-CM | POA: Diagnosis not present

## 2019-09-16 DIAGNOSIS — R262 Difficulty in walking, not elsewhere classified: Secondary | ICD-10-CM

## 2019-09-16 DIAGNOSIS — R600 Localized edema: Secondary | ICD-10-CM

## 2019-09-16 DIAGNOSIS — M25572 Pain in left ankle and joints of left foot: Secondary | ICD-10-CM

## 2019-09-16 DIAGNOSIS — M25552 Pain in left hip: Secondary | ICD-10-CM

## 2019-09-16 NOTE — Therapy (Signed)
[place], [place], [address] Phone: [phone]   Fax:  [phone]  Physical Therapy Treatment  Patient Details  Name: [name] MRN: [id number] Date of Birth: 18-Oct-1970 Referring Provider (PT): Pershing Cox Date: 09/16/2019  PT End of Session - 09/16/19 0932    Visit Number  6    Authorization Type  W/C    PT Start Time  0854    PT Stop Time  0940    PT Time Calculation (min)  46 min    Activity Tolerance  Patient tolerated treatment well    Behavior During Therapy  [facility] for tasks assessed/performed       Past Medical History:  Diagnosis Date  . Anemia   . Anxiety   . ASCUS favoring benign 12/2015   negative high risk HPV  . Chicken pox   . GERD (gastroesophageal reflux disease)    during pregnancy   . 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 Hyperlipidemia   . Hypertension    during pregnancy   . 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 Reflux     Past Surgical History:  Procedure Laterality Date  . COLPOSCOPY    . LAPAROSCOPIC VAGINAL HYSTERECTOMY WITH SALPINGO OOPHORECTOMY Bilateral 08/10/2018   Procedure: LAPAROSCOPIC ASSISTED VAGINAL HYSTERECTOMY WITH BILATERAL SALPINGO OOPHORECTOMY;  Surgeon: [name], MD;  Location: [facility];  Service: Gynecology;  Laterality: Bilateral;  . TONSILLECTOMY AND ADENOIDECTOMY      There were no vitals filed for this visit.  Subjective Assessment - 09/16/19 0858    Subjective  LLE soreness is easing up. Ankle has some soreness around the ankle    Currently in Pain?  Yes    Pain Score  4     Pain Location  Leg    Pain Orientation  Left;Posterior                       [facility] Adult PT Treatment/Exercise - 09/16/19 0001      Moist Heat Therapy   Number Minutes Moist Heat  10 Minutes    Moist Heat Location  Lumbar Spine      Cryotherapy   Number Minutes  Cryotherapy  10 Minutes    Cryotherapy Location  Ankle    Type of Cryotherapy  Ice pack      Ankle Exercises: Aerobic   Elliptical  I5 R 3 x3 min     Recumbent Bike  L0 x5 min       Ankle Exercises: Standing   Other Standing Ankle Exercises  resisted gait 30lb 4 way x 3     Other Standing Ankle Exercises  heel raises on airex  2x15, Green band PF/DF 2x15       Ankle Exercises: Machines for Strengthening   Cybex Leg Press  20lb 2x10                PT Short Term Goals - 09/07/19 1440      PT SHORT TERM GOAL #1   Title  She will be independent with inital HEP.     Status  Partially Met        PT Long Term Goals - 09/07/19 1440      PT LONG TERM GOAL #2   Title  report buttock pain 50% better    Status  On-going  Plan - 09/16/19 0935    Clinical Impression Statement  Pt ~ 9 minutes late for today's session. She reports some improvements in her L quad and HS soreness. L ankle is weak and fatigues quick. Gave Pt an Tband for resisted ankle exercises at home. Cues for appropriate gait pattern and ankle articulation with resisted gait. Some instability with resisted side step.    Stability/Clinical Decision Making  Stable/Uncomplicated    Rehab Potential  Good    PT Frequency  2x / week    PT Treatment/Interventions  ADLs/Self Care Home Management;Cryotherapy;Electrical Stimulation;Moist Heat;Ultrasound;DME Instruction;Therapeutic activities;Therapeutic exercise;Balance training;Neuromuscular re-education;Manual techniques;Patient/family education;Vasopneumatic Device    PT Next Visit Plan  Ankle stability and ROM. encourage self massage to tightness       Patient will benefit from skilled therapeutic intervention in order to improve the following deficits and impairments:  Abnormal gait, Improper body mechanics, Pain, Increased muscle spasms, Decreased mobility, Decreased activity tolerance, Decreased endurance, Decreased range of motion, Decreased  strength, Impaired flexibility, Increased edema, Difficulty walking  Visit Diagnosis: Stiffness of left ankle, not elsewhere classified  Pain in left hip  Localized edema  Pain in left ankle and joints of left foot  Difficulty in walking, not elsewhere classified     Problem List Patient Active Problem List   Diagnosis Date Noted  . Aortic valve regurgitation 06/24/2019  . Urinary incontinence 06/24/2019  . Parapelvic renal cyst 06/24/2019  . Chronic shoulder bursitis, left 06/24/2019  . Arthritis 06/24/2019  . Essential hypertension 03/18/2019  . Mid back pain 03/18/2019  . Muscle weakness (generalized) 03/18/2019  . Pleuritic chest pain 03/18/2019  . Menorrhagia 08/10/2018  . Vitamin D deficiency 12/17/2017  . Iron deficiency anemia 11/13/2017  . Alopecia 11/03/2017  . Seborrheic dermatitis of scalp 11/03/2017  . Cyst of ovary 11/03/2017  . Atypical squamous cells of undetermined significance on cytologic smear of cervix (ASC-US) 11/03/2017  . Right foot pain 11/03/2017  . Thrombocytosis ([place]) 10/31/2017  . Leg edema, left 06/11/2013  . Petechiae 06/11/2013  . LGSIL (low grade squamous intraepithelial dysplasia)   . Reflux   . Hyperlipidemia 04/23/2007  . GAD (generalized anxiety disorder) 04/23/2007  . DEPRESSION 04/23/2007  . GERD 04/23/2007  . HYPERTENSION, GESTATIONAL 04/23/2007  . GESTATIONAL DIABETES 04/23/2007  . DE QUERVAIN'S TENOSYNOVITIS 04/23/2007    Scot Jun, PTA 09/16/2019, 9:37 AM  [place], [place], [address] Phone: [phone]   Fax:  [phone]  Name: [name] MRN: [id number] Date of Birth: [birth year]

## 2019-09-21 ENCOUNTER — Other Ambulatory Visit: Payer: Self-pay

## 2019-09-21 ENCOUNTER — Encounter: Payer: Self-pay | Admitting: Physical Therapy

## 2019-09-21 ENCOUNTER — Ambulatory Visit: Payer: PRIVATE HEALTH INSURANCE | Admitting: Physical Therapy

## 2019-09-21 DIAGNOSIS — R262 Difficulty in walking, not elsewhere classified: Secondary | ICD-10-CM

## 2019-09-21 DIAGNOSIS — R600 Localized edema: Secondary | ICD-10-CM

## 2019-09-21 DIAGNOSIS — M25572 Pain in left ankle and joints of left foot: Secondary | ICD-10-CM

## 2019-09-21 DIAGNOSIS — M25672 Stiffness of left ankle, not elsewhere classified: Secondary | ICD-10-CM

## 2019-09-21 DIAGNOSIS — M25552 Pain in left hip: Secondary | ICD-10-CM

## 2019-09-21 MED FILL — CARVEDILOL 3.125 MG TABLET: 3.125 | 90 days supply | Qty: 180 | Fill #1

## 2019-09-21 NOTE — Therapy (Signed)
[place], [place], [address] Phone: [phone]   Fax:  [phone]  Physical Therapy Treatment  Patient Details  Name: [name] MRN: [id number] Date of Birth: 1971/02/02 Referring Provider (PT): Pershing Cox Date: 09/21/2019  PT End of Session - 09/21/19 1007    Visit Number  7    Authorization Type  W/C    PT Start Time  0937    PT Stop Time  1017    PT Time Calculation (min)  40 min    Activity Tolerance  Patient tolerated treatment well    Behavior During Therapy  [facility] for tasks assessed/performed       Past Medical History:  Diagnosis Date  . Anemia   . Anxiety   . ASCUS favoring benign 12/2015   negative high risk HPV  . Chicken pox   . GERD (gastroesophageal reflux disease)    during pregnancy   . 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 Hyperlipidemia   . Hypertension    during pregnancy   . 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 Reflux     Past Surgical History:  Procedure Laterality Date  . COLPOSCOPY    . LAPAROSCOPIC VAGINAL HYSTERECTOMY WITH SALPINGO OOPHORECTOMY Bilateral 08/10/2018   Procedure: LAPAROSCOPIC ASSISTED VAGINAL HYSTERECTOMY WITH BILATERAL SALPINGO OOPHORECTOMY;  Surgeon: [name], MD;  Location: [facility];  Service: Gynecology;  Laterality: Bilateral;  . TONSILLECTOMY AND ADENOIDECTOMY      There were no vitals filed for this visit.  Subjective Assessment - 09/21/19 0940    Subjective  "Feeling ok" "I am losing my calf  muscle"    Currently in Pain?  No/denies    Pain Location  Calf    Pain Orientation  Left    Pain Descriptors / Indicators  Sore                       [facility] Adult PT Treatment/Exercise - 09/21/19 0001      Ambulation/Gait   Stairs  Yes    Stairs Assistance  6: Modified independent (Device/Increase time)    Stair Management Technique  No  rails;Alternating pattern    Number of Stairs  72    Height of Stairs  6      Cryotherapy   Number Minutes Cryotherapy  10 Minutes    Cryotherapy Location  Ankle    Type of Cryotherapy  Ice pack      Ankle Exercises: Aerobic   Elliptical  I7  R5 x2 min     Recumbent Bike  L0 x5 min       Ankle Exercises: Standing   Other Standing Ankle Exercises  resisted gait 30lb 4 way x 3, Toe and heel walking     Other Standing Ankle Exercises  heel raises black bar 2x10      Ankle Exercises: Machines for Strengthening   Cybex Leg Press  40lb 2x10                PT Short Term Goals - 09/07/19 1440      PT SHORT TERM GOAL #1   Title  She will be independent with inital HEP.     Status  Partially Met        PT Long Term Goals - 09/21/19 1008      PT LONG TERM GOAL #  1   Title  She will be independent with all HEP issued as of last visit    Status  Achieved      PT LONG TERM GOAL #2   Title  report buttock pain 50% better    Status  Partially Met      PT LONG TERM GOAL #3   Title  50% less left ankle pain    Status  Partially Met      PT LONG TERM GOAL #4   Title  walk without difficulty    Status  Partially Met            Plan - 09/21/19 1008    Clinical Impression Statement  Pt ~ 7 minutes late. She has progressed meeting some goals. Reports some plantar foot pain with heel raises. Cues to keep RLE pointed straight when descending stairs. Lateral foot pain reported with heel walking    Stability/Clinical Decision Making  Stable/Uncomplicated    Rehab Potential  Good    PT Frequency  2x / week    PT Duration  4 weeks    PT Treatment/Interventions  ADLs/Self Care Home Management;Cryotherapy;Electrical Stimulation;Moist Heat;Ultrasound;DME Instruction;Therapeutic activities;Therapeutic exercise;Balance training;Neuromuscular re-education;Manual techniques;Patient/family education;Vasopneumatic Device    PT Next Visit Plan  Ankle stability and ROM. encourage self  massage to tightness       Patient will benefit from skilled therapeutic intervention in order to improve the following deficits and impairments:  Abnormal gait, Improper body mechanics, Pain, Increased muscle spasms, Decreased mobility, Decreased activity tolerance, Decreased endurance, Decreased range of motion, Decreased strength, Impaired flexibility, Increased edema, Difficulty walking  Visit Diagnosis: Stiffness of left ankle, not elsewhere classified  Localized edema  Pain in left hip  Pain in left ankle and joints of left foot  Difficulty in walking, not elsewhere classified     Problem List Patient Active Problem List   Diagnosis Date Noted  . Aortic valve regurgitation 06/24/2019  . Urinary incontinence 06/24/2019  . Parapelvic renal cyst 06/24/2019  . Chronic shoulder bursitis, left 06/24/2019  . Arthritis 06/24/2019  . Essential hypertension 03/18/2019  . Mid back pain 03/18/2019  . Muscle weakness (generalized) 03/18/2019  . Pleuritic chest pain 03/18/2019  . Menorrhagia 08/10/2018  . Vitamin D deficiency 12/17/2017  . Iron deficiency anemia 11/13/2017  . Alopecia 11/03/2017  . Seborrheic dermatitis of scalp 11/03/2017  . Cyst of ovary 11/03/2017  . Atypical squamous cells of undetermined significance on cytologic smear of cervix (ASC-US) 11/03/2017  . Right foot pain 11/03/2017  . Thrombocytosis ([place]) 10/31/2017  . Leg edema, left 06/11/2013  . Petechiae 06/11/2013  . LGSIL (low grade squamous intraepithelial dysplasia)   . Reflux   . Hyperlipidemia 04/23/2007  . GAD (generalized anxiety disorder) 04/23/2007  . DEPRESSION 04/23/2007  . GERD 04/23/2007  . HYPERTENSION, GESTATIONAL 04/23/2007  . GESTATIONAL DIABETES 04/23/2007  . DE QUERVAIN'S TENOSYNOVITIS 04/23/2007    Scot Jun, PTA 09/21/2019, 10:11 AM  [place] [address] [place], [place], [address] Phone: [phone]    Fax:  [phone]  Name: ANANYA MCCLEESE MRN: [id number] Date of Birth: 10-21-1970

## 2019-09-22 ENCOUNTER — Telehealth: Payer: Self-pay | Admitting: Internal Medicine

## 2019-09-22 NOTE — Telephone Encounter (Signed)
Would advise to only take mobic as needed both are nsaids so have to use caution on taking both daily

## 2019-09-22 NOTE — Telephone Encounter (Signed)
Patient has been informed.

## 2019-09-22 NOTE — Telephone Encounter (Signed)
Pt would like a call regarding meloxicam (MOBIC) 7.5 MG tablet if she can take a aspirin 81mg  with it? Please advise and Thank you!  Call pt @ [phone].

## 2019-09-23 ENCOUNTER — Ambulatory Visit: Payer: PRIVATE HEALTH INSURANCE | Attending: Orthopaedic Surgery | Admitting: Physical Therapy

## 2019-09-23 ENCOUNTER — Other Ambulatory Visit: Payer: Self-pay

## 2019-09-23 ENCOUNTER — Encounter: Payer: Self-pay | Admitting: Physical Therapy

## 2019-09-23 DIAGNOSIS — M25552 Pain in left hip: Secondary | ICD-10-CM | POA: Diagnosis present

## 2019-09-23 DIAGNOSIS — R600 Localized edema: Secondary | ICD-10-CM | POA: Insufficient documentation

## 2019-09-23 DIAGNOSIS — M25672 Stiffness of left ankle, not elsewhere classified: Secondary | ICD-10-CM | POA: Diagnosis present

## 2019-09-23 NOTE — Therapy (Signed)
[place], [place], [address] Phone: [phone]   Fax:  [phone]  Physical Therapy Treatment  Patient Details  Name: [name] MRN: [id number] Date of Birth: 1970-10-19 Referring Provider (PT): Pershing Cox Date: 09/23/2019  PT End of Session - 09/23/19 1015    Visit Number  8    Authorization Type  W/C    PT Start Time  0938    PT Stop Time  1023    PT Time Calculation (min)  45 min    Activity Tolerance  Patient tolerated treatment well    Behavior During Therapy  [facility] for tasks assessed/performed       Past Medical History:  Diagnosis Date  . Anemia   . Anxiety   . ASCUS favoring benign 12/2015   negative high risk HPV  . Chicken pox   . GERD (gastroesophageal reflux disease)    during pregnancy   . 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 Hyperlipidemia   . Hypertension    during pregnancy   . 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 Reflux     Past Surgical History:  Procedure Laterality Date  . COLPOSCOPY    . LAPAROSCOPIC VAGINAL HYSTERECTOMY WITH SALPINGO OOPHORECTOMY Bilateral 08/10/2018   Procedure: LAPAROSCOPIC ASSISTED VAGINAL HYSTERECTOMY WITH BILATERAL SALPINGO OOPHORECTOMY;  Surgeon: [name], MD;  Location: [facility];  Service: Gynecology;  Laterality: Bilateral;  . TONSILLECTOMY AND ADENOIDECTOMY      There were no vitals filed for this visit.  Subjective Assessment - 09/23/19 0941    Subjective  "Ok" "Just soreness on the foot and part of the calf"    Currently in Pain?  No/denies         [facility] PT Assessment - 09/23/19 0001      AROM   Overall AROM Comments  Lumbar ROM WFL    Right/Left Ankle  Left    Left Ankle Dorsiflexion  6    Left Ankle Plantar Flexion  40    Left Ankle Inversion  29    Left Ankle Eversion  20                   [facility] Adult PT Treatment/Exercise -  09/23/19 0001      Ambulation/Gait   Stairs  Yes    Stairs Assistance  6: Modified independent (Device/Increase time)    Stair Management Technique  No rails;Alternating pattern    Number of Stairs  72    Height of Stairs  6      Moist Heat Therapy   Number Minutes Moist Heat  10 Minutes    Moist Heat Location  Other (comment)   Calf and hamstring     Ankle Exercises: Aerobic   Elliptical  I9  R5 x2 min each way     Recumbent Bike  L0 x 3 min       Ankle Exercises: Machines for Strengthening   Cybex Leg Press  40lb 2x15, heel raises 40lb 2x10       Ankle Exercises: Standing   Other Standing Ankle Exercises  Toe and heel walking      Ankle Exercises: Stretches   Gastroc Stretch  4 reps;10 seconds               PT Short Term Goals - 09/07/19 1440      PT  SHORT TERM GOAL #1   Title  She will be independent with inital HEP.     Status  Partially Met        PT Long Term Goals - 09/21/19 1008      PT LONG TERM GOAL #1   Title  She will be independent with all HEP issued as of last visit    Status  Achieved      PT LONG TERM GOAL #2   Title  report buttock pain 50% better    Status  Partially Met      PT LONG TERM GOAL #3   Title  50% less left ankle pain    Status  Partially Met      PT LONG TERM GOAL #4   Title  walk without difficulty    Status  Partially Met            Plan - 09/23/19 1017    Clinical Impression Statement  Pt ~ 8 min late for today's session. She has progressed increasing her ankle ROM in all directions. She did well with stair negotiation but her L heel did raise up early  when descending. Little heel elevation bilaterally with toe walking. Some pain reported with calf stretch.    Stability/Clinical Decision Making  Stable/Uncomplicated    Rehab Potential  Good    PT Frequency  2x / week    PT Duration  4 weeks    PT Treatment/Interventions  ADLs/Self Care Home Management;Cryotherapy;Electrical Stimulation;Moist  Heat;Ultrasound;DME Instruction;Therapeutic activities;Therapeutic exercise;Balance training;Neuromuscular re-education;Manual techniques;Patient/family education;Vasopneumatic Device    PT Next Visit Plan  Ankle stability and ROM. encourage self massage to tightness       Patient will benefit from skilled therapeutic intervention in order to improve the following deficits and impairments:  Abnormal gait, Improper body mechanics, Pain, Increased muscle spasms, Decreased mobility, Decreased activity tolerance, Decreased endurance, Decreased range of motion, Decreased strength, Impaired flexibility, Increased edema, Difficulty walking  Visit Diagnosis: Stiffness of left ankle, not elsewhere classified  Localized edema  Pain in left hip     Problem List Patient Active Problem List   Diagnosis Date Noted  . Aortic valve regurgitation 06/24/2019  . Urinary incontinence 06/24/2019  . Parapelvic renal cyst 06/24/2019  . Chronic shoulder bursitis, left 06/24/2019  . Arthritis 06/24/2019  . Essential hypertension 03/18/2019  . Mid back pain 03/18/2019  . Muscle weakness (generalized) 03/18/2019  . Pleuritic chest pain 03/18/2019  . Menorrhagia 08/10/2018  . Vitamin D deficiency 12/17/2017  . Iron deficiency anemia 11/13/2017  . Alopecia 11/03/2017  . Seborrheic dermatitis of scalp 11/03/2017  . Cyst of ovary 11/03/2017  . Atypical squamous cells of undetermined significance on cytologic smear of cervix (ASC-US) 11/03/2017  . Right foot pain 11/03/2017  . Thrombocytosis ([place]) 10/31/2017  . Leg edema, left 06/11/2013  . Petechiae 06/11/2013  . LGSIL (low grade squamous intraepithelial dysplasia)   . Reflux   . Hyperlipidemia 04/23/2007  . GAD (generalized anxiety disorder) 04/23/2007  . DEPRESSION 04/23/2007  . GERD 04/23/2007  . HYPERTENSION, GESTATIONAL 04/23/2007  . GESTATIONAL DIABETES 04/23/2007  . DE QUERVAIN'S TENOSYNOVITIS 04/23/2007    Scot Jun,  PTA 09/23/2019, 10:28 AM  [place] [address] [place], [place], [address] Phone: [phone]   Fax:  [phone]  Name: EVERLENA MACKLEY MRN: [id number] Date of Birth: 07-Feb-1971

## 2019-09-28 ENCOUNTER — Encounter: Payer: Self-pay | Admitting: Physical Therapy

## 2019-09-28 ENCOUNTER — Ambulatory Visit: Payer: PRIVATE HEALTH INSURANCE | Admitting: Physical Therapy

## 2019-09-28 ENCOUNTER — Ambulatory Visit: Payer: No Typology Code available for payment source | Admitting: Internal Medicine

## 2019-09-28 ENCOUNTER — Other Ambulatory Visit: Payer: Self-pay

## 2019-09-28 DIAGNOSIS — R600 Localized edema: Secondary | ICD-10-CM

## 2019-09-28 DIAGNOSIS — M25552 Pain in left hip: Secondary | ICD-10-CM

## 2019-09-28 DIAGNOSIS — M25672 Stiffness of left ankle, not elsewhere classified: Secondary | ICD-10-CM | POA: Diagnosis not present

## 2019-09-28 NOTE — Therapy (Signed)
[place] [address] [place], [place], [address] Phone: [phone]   Fax:  [phone]  Physical Therapy Treatment  Patient Details  Name: [name] MRN: [id number] Date of Birth: [birth year] Referring Provider (PT): Pershing Cox Date: 09/28/2019  PT End of Session - 09/28/19 1009    Visit Number  9    Authorization Type  W/C    PT Start Time  0939    PT Stop Time  1019    PT Time Calculation (min)  40 min    Activity Tolerance  Patient tolerated treatment well    Behavior During Therapy  [facility] for tasks assessed/performed       Past Medical History:  Diagnosis Date  . Anemia   . Anxiety   . ASCUS favoring benign 12/2015   negative high risk HPV  . Chicken pox   . GERD (gastroesophageal reflux disease)    during pregnancy   . 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 Hyperlipidemia   . Hypertension    during pregnancy   . 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 Reflux     Past Surgical History:  Procedure Laterality Date  . COLPOSCOPY    . LAPAROSCOPIC VAGINAL HYSTERECTOMY WITH SALPINGO OOPHORECTOMY Bilateral 08/10/2018   Procedure: LAPAROSCOPIC ASSISTED VAGINAL HYSTERECTOMY WITH BILATERAL SALPINGO OOPHORECTOMY;  Surgeon: [name], MD;  Location: [facility];  Service: Gynecology;  Laterality: Bilateral;  . TONSILLECTOMY AND ADENOIDECTOMY      There were no vitals filed for this visit.  Subjective Assessment - 09/28/19 0941    Subjective  Foot and hamstring has been sore. Reports some anterior Tibia pain    Currently in Pain?  Yes    Pain Score  5     Pain Location  Foot    Pain Orientation  Left                       [facility] Adult PT Treatment/Exercise - 09/28/19 0001      Moist Heat Therapy   Number Minutes Moist Heat  10 Minutes    Moist Heat Location  Other (comment)   L gastroc     Ankle Exercises: Aerobic    Elliptical  I10  R5 x2 min each way     Recumbent Bike  L1 x 3 min       Ankle Exercises: Standing   Other Standing Ankle Exercises  resisted gait 30lb 4 way x 3, Toe and heel walking     Other Standing Ankle Exercises  heel raises black bar 2x10      Ankle Exercises: Machines for Strengthening   Cybex Leg Press  40lb 2x15, heel raises 40lb 2x10                PT Short Term Goals - 09/07/19 1440      PT SHORT TERM GOAL #1   Title  She will be independent with inital HEP.     Status  Partially Met        PT Long Term Goals - 09/21/19 1008      PT LONG TERM GOAL #1   Title  She will be independent with all HEP issued as of last visit    Status  Achieved      PT LONG TERM GOAL #2   Title  report buttock pain 50%  better    Status  Partially Met      PT LONG TERM GOAL #3   Title  50% less left ankle pain    Status  Partially Met      PT LONG TERM GOAL #4   Title  walk without difficulty    Status  Partially Met            Plan - 09/28/19 1009    Clinical Impression Statement  Pt ~ 9 minutes late for today. She is able to complete all interventions but does report some calf and tibials anterior pain. Pain increases with activity. Cues not to compensate with heel raises. Some instability with resisted side step.    Stability/Clinical Decision Making  Stable/Uncomplicated    Rehab Potential  Good    PT Frequency  2x / week    PT Duration  4 weeks    PT Treatment/Interventions  ADLs/Self Care Home Management;Cryotherapy;Electrical Stimulation;Moist Heat;Ultrasound;DME Instruction;Therapeutic activities;Therapeutic exercise;Balance training;Neuromuscular re-education;Manual techniques;Patient/family education;Vasopneumatic Device       Patient will benefit from skilled therapeutic intervention in order to improve the following deficits and impairments:  Abnormal gait, Improper body mechanics, Pain, Increased muscle spasms, Decreased mobility, Decreased  activity tolerance, Decreased endurance, Decreased range of motion, Decreased strength, Impaired flexibility, Increased edema, Difficulty walking  Visit Diagnosis: Localized edema  Stiffness of left ankle, not elsewhere classified  Pain in left hip     Problem List Patient Active Problem List   Diagnosis Date Noted  . Aortic valve regurgitation 06/24/2019  . Urinary incontinence 06/24/2019  . Parapelvic renal cyst 06/24/2019  . Chronic shoulder bursitis, left 06/24/2019  . Arthritis 06/24/2019  . Essential hypertension 03/18/2019  . Mid back pain 03/18/2019  . Muscle weakness (generalized) 03/18/2019  . Pleuritic chest pain 03/18/2019  . Menorrhagia 08/10/2018  . Vitamin D deficiency 12/17/2017  . Iron deficiency anemia 11/13/2017  . Alopecia 11/03/2017  . Seborrheic dermatitis of scalp 11/03/2017  . Cyst of ovary 11/03/2017  . Atypical squamous cells of undetermined significance on cytologic smear of cervix (ASC-US) 11/03/2017  . Right foot pain 11/03/2017  . Thrombocytosis ([place]) 10/31/2017  . Leg edema, left 06/11/2013  . Petechiae 06/11/2013  . LGSIL (low grade squamous intraepithelial dysplasia)   . Reflux   . Hyperlipidemia 04/23/2007  . GAD (generalized anxiety disorder) 04/23/2007  . DEPRESSION 04/23/2007  . GERD 04/23/2007  . HYPERTENSION, GESTATIONAL 04/23/2007  . GESTATIONAL DIABETES 04/23/2007  . DE QUERVAIN'S TENOSYNOVITIS 04/23/2007    Scot Jun 09/28/2019, 10:11 AM  [address] [place], [place], [address] Phone: [phone]   Fax:  [phone]  Name: JAQUITA BESSIRE MRN: [id number] Date of Birth: [birth year]

## 2019-09-30 ENCOUNTER — Ambulatory Visit: Payer: PRIVATE HEALTH INSURANCE | Admitting: Physical Therapy

## 2019-09-30 ENCOUNTER — Encounter: Payer: Self-pay | Admitting: Physical Therapy

## 2019-09-30 ENCOUNTER — Other Ambulatory Visit: Payer: Self-pay

## 2019-09-30 DIAGNOSIS — M25552 Pain in left hip: Secondary | ICD-10-CM

## 2019-09-30 DIAGNOSIS — M25672 Stiffness of left ankle, not elsewhere classified: Secondary | ICD-10-CM

## 2019-09-30 DIAGNOSIS — R600 Localized edema: Secondary | ICD-10-CM

## 2019-09-30 DIAGNOSIS — M25572 Pain in left ankle and joints of left foot: Secondary | ICD-10-CM | POA: Diagnosis not present

## 2019-09-30 DIAGNOSIS — R262 Difficulty in walking, not elsewhere classified: Secondary | ICD-10-CM

## 2019-09-30 NOTE — Therapy (Signed)
[facility] [place] [address], [place], [address] Phone: [phone]   Fax:  [phone]  Physical Therapy Treatment  Patient Details  Name: [name] MRN: [id number] Date of Birth: 02-20-1971 Referring Provider (PT): Pershing Cox Date: 09/30/2019  PT End of Session - 09/30/19 1453    Visit Number  10    Authorization Type  W/C    PT Start Time  1409    PT Stop Time  1508    PT Time Calculation (min)  59 min    Activity Tolerance  Patient tolerated treatment well;Patient limited by pain    Behavior During Therapy  [facility] for tasks assessed/performed       Past Medical History:  Diagnosis Date  . Anemia   . Anxiety   . ASCUS favoring benign 12/2015   negative high risk HPV  . Chicken pox   . GERD (gastroesophageal reflux disease)    during pregnancy   . 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 Hyperlipidemia   . Hypertension    during pregnancy   . 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 Reflux     Past Surgical History:  Procedure Laterality Date  . COLPOSCOPY    . LAPAROSCOPIC VAGINAL HYSTERECTOMY WITH SALPINGO OOPHORECTOMY Bilateral 08/10/2018   Procedure: LAPAROSCOPIC ASSISTED VAGINAL HYSTERECTOMY WITH BILATERAL SALPINGO OOPHORECTOMY;  Surgeon: [name], MD;  Location: [facility];  Service: Gynecology;  Laterality: Bilateral;  . TONSILLECTOMY AND ADENOIDECTOMY      There were no vitals filed for this visit.  Subjective Assessment - 09/30/19 1429    Subjective  Patient reports frustration with her pain, reports pain in the calf and the anterior tibialis and the anterior ankle    Currently in Pain?  Yes    Pain Score  6     Pain Location  Ankle    Pain Orientation  Left    Pain Descriptors / Indicators  Aching;Sore    Aggravating Factors   walking                       [facility] Adult PT Treatment/Exercise - 09/30/19  0001      Ambulation/Gait   Gait Comments  carrying 20# box job simulation, some picking up the box and setting it down, up and down stairs step over step wihtout handrail      High Level Balance   High Level Balance Activities  Tandem walking    High Level Balance Comments  on airex marching, on airex eyes closed, toe walk, heel walk      Manual Therapy   Manual Therapy  Soft tissue mobilization;Passive ROM    Manual therapy comments  gentle mid foot joint mobs    Soft tissue mobilization  light gentel STM to the left anterior tib, the PF and some in the gastroc    Passive ROM  PROMleft ankle, passive stretch all the motions, passive HS and piriformis stretches, PF stretches      Ankle Exercises: Seated   Other Seated Ankle Exercises  foot on airex PF/DF, inv/Ev and circles      Ankle Exercises: Aerobic   Elliptical  I10  R5 x2 min each way                PT Short Term Goals - 09/07/19 1440      PT  SHORT TERM GOAL #1   Title  She will be independent with inital HEP.     Status  Partially Met        PT Long Term Goals - 09/30/19 1455      PT LONG TERM GOAL #3   Title  50% less left ankle pain    Status  Partially Met            Plan - 09/30/19 1454    Clinical Impression Statement  Patient seems to be focused on her pain and asks repeatedly about "what is wrong with it". she does have tightness in teh calf the anterior tibialis and the PF area, she is very tender here.  She is walking and moving better, but is anxious    PT Next Visit Plan  Ankle stability and ROM. encourage self massage to tightness    Consulted and Agree with Plan of Care  Patient       Patient will benefit from skilled therapeutic intervention in order to improve the following deficits and impairments:  Abnormal gait, Improper body mechanics, Pain, Increased muscle spasms, Decreased mobility, Decreased activity tolerance, Decreased endurance, Decreased range of motion, Decreased  strength, Impaired flexibility, Increased edema, Difficulty walking  Visit Diagnosis: Localized edema  Stiffness of left ankle, not elsewhere classified  Pain in left hip  Pain in left ankle and joints of left foot  Difficulty in walking, not elsewhere classified     Problem List Patient Active Problem List   Diagnosis Date Noted  . Aortic valve regurgitation 06/24/2019  . Urinary incontinence 06/24/2019  . Parapelvic renal cyst 06/24/2019  . Chronic shoulder bursitis, left 06/24/2019  . Arthritis 06/24/2019  . Essential hypertension 03/18/2019  . Mid back pain 03/18/2019  . Muscle weakness (generalized) 03/18/2019  . Pleuritic chest pain 03/18/2019  . Menorrhagia 08/10/2018  . Vitamin D deficiency 12/17/2017  . Iron deficiency anemia 11/13/2017  . Alopecia 11/03/2017  . Seborrheic dermatitis of scalp 11/03/2017  . Cyst of ovary 11/03/2017  . Atypical squamous cells of undetermined significance on cytologic smear of cervix (ASC-US) 11/03/2017  . Right foot pain 11/03/2017  . Thrombocytosis ([place]) 10/31/2017  . Leg edema, left 06/11/2013  . Petechiae 06/11/2013  . LGSIL (low grade squamous intraepithelial dysplasia)   . Reflux   . Hyperlipidemia 04/23/2007  . GAD (generalized anxiety disorder) 04/23/2007  . DEPRESSION 04/23/2007  . GERD 04/23/2007  . HYPERTENSION, GESTATIONAL 04/23/2007  . GESTATIONAL DIABETES 04/23/2007  . DE QUERVAIN'S TENOSYNOVITIS 04/23/2007    Sumner Boast., PT 09/30/2019, 2:57 PM  [address] [address] [place], [place], [address] Phone: [phone]   Fax:  [phone]  Name: [name] MRN: [id number] Date of Birth: February 06, 1971

## 2019-10-04 ENCOUNTER — Telehealth: Payer: Self-pay | Admitting: Internal Medicine

## 2019-10-04 NOTE — Telephone Encounter (Signed)
Pt was out of work and needs note from 08/23/19 through 09/04/19 for work. She had covid and she said Dr. Olivia Mackie told her to be out for the full 14 days since she was having sinus issues after the 10 days. She spoke with Fransisco Beau on 09/01/19. Patient is asking could this be picked up?

## 2019-10-05 ENCOUNTER — Encounter: Payer: Self-pay | Admitting: Physical Therapy

## 2019-10-05 ENCOUNTER — Ambulatory Visit: Payer: PRIVATE HEALTH INSURANCE | Attending: Orthopaedic Surgery | Admitting: Physical Therapy

## 2019-10-05 ENCOUNTER — Ambulatory Visit: Payer: PRIVATE HEALTH INSURANCE | Admitting: Physical Therapy

## 2019-10-05 ENCOUNTER — Other Ambulatory Visit: Payer: Self-pay

## 2019-10-05 DIAGNOSIS — M25552 Pain in left hip: Secondary | ICD-10-CM | POA: Insufficient documentation

## 2019-10-05 DIAGNOSIS — M25672 Stiffness of left ankle, not elsewhere classified: Secondary | ICD-10-CM | POA: Insufficient documentation

## 2019-10-05 DIAGNOSIS — M25572 Pain in left ankle and joints of left foot: Secondary | ICD-10-CM | POA: Diagnosis present

## 2019-10-05 DIAGNOSIS — R262 Difficulty in walking, not elsewhere classified: Secondary | ICD-10-CM | POA: Insufficient documentation

## 2019-10-05 DIAGNOSIS — R600 Localized edema: Secondary | ICD-10-CM | POA: Diagnosis present

## 2019-10-05 NOTE — Telephone Encounter (Signed)
I have printed a letter for patient & you can see in chart. If okay I will print so you can sign?

## 2019-10-05 NOTE — Telephone Encounter (Signed)
Ok

## 2019-10-05 NOTE — Therapy (Signed)
[place] [address] [place], [place], [address] Phone: [phone]   Fax:  [phone]  Physical Therapy Treatment  Patient Details  Name: [name] MRN: [id number] Date of Birth: 07/07/1971 Referring Provider (PT): Pershing Cox Date: 10/05/2019  PT End of Session - 10/05/19 1420    Visit Number  11    Authorization Type  W/C    PT Start Time  1319    PT Stop Time  1410    PT Time Calculation (min)  51 min    Activity Tolerance  Patient tolerated treatment well;Patient limited by pain    Behavior During Therapy  [facility] for tasks assessed/performed       Past Medical History:  Diagnosis Date  . Anemia   . Anxiety   . ASCUS favoring benign 12/2015   negative high risk HPV  . Chicken pox   . GERD (gastroesophageal reflux disease)    during pregnancy   . 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 Hyperlipidemia   . Hypertension    during pregnancy   . 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 Reflux     Past Surgical History:  Procedure Laterality Date  . COLPOSCOPY    . LAPAROSCOPIC VAGINAL HYSTERECTOMY WITH SALPINGO OOPHORECTOMY Bilateral 08/10/2018   Procedure: LAPAROSCOPIC ASSISTED VAGINAL HYSTERECTOMY WITH BILATERAL SALPINGO OOPHORECTOMY;  Surgeon: [name], MD;  Location: [facility];  Service: Gynecology;  Laterality: Bilateral;  . TONSILLECTOMY AND ADENOIDECTOMY      There were no vitals filed for this visit.  Subjective Assessment - 10/05/19 1320    Subjective  Patient reports that she is having more plantar heel pain today, "feels like I am walking on bone"    Currently in Pain?  Yes    Pain Score  7     Pain Location  Heel    Pain Orientation  Left    Aggravating Factors   walking                       [facility] Adult PT Treatment/Exercise - 10/05/19 0001      Ambulation/Gait   Gait Comments  carrying 20# box job  simulation, some picking up the box and setting it down, up and down stairs step over step wihtout handrail      High Level Balance   High Level Balance Comments  on airex marching, on airex eyes closed      Manual Therapy   Manual Therapy  Soft tissue mobilization;Passive ROM    Manual therapy comments  gentle mid foot joint mobs    Soft tissue mobilization  light gentle STM to the left anterior tib, the PF and some in the gastroc    Passive ROM  PROMleft ankle, passive stretch all the motions, passive HS and piriformis stretches, PF stretches      Ankle Exercises: Aerobic   Elliptical  I10  R5 x2 min each way     Recumbent Bike  L1 x 5 min       Ankle Exercises: Standing   Other Standing Ankle Exercises  resisted gait all directions      Ankle Exercises: Seated   Other Seated Ankle Exercises  step on and off airex and over bolsters      Ankle Exercises: Supine   T-Band  all tband red 3x10 with a lot of  cues and assist to keep from compensation               PT Short Term Goals - 09/07/19 1440      PT [place] #1   Title  She will be independent with inital HEP.     Status  Partially Met        PT Long Term Goals - 09/30/19 1455      PT LONG TERM GOAL #3   Title  50% less left ankle pain    Status  Partially Met            Plan - 10/05/19 1420    Clinical Impression Statement  Patient is moving and walking better, less limp, she does report increased plantar heel pain, she also has pain in different areas on the ankle and calf with certain activities.  Pain can be rated as high as an 8-9/10 at times.  I feel like she is doing better but again high ratings of pain.  She is tight into DF    PT Next Visit Plan  Ankle stability and ROM. encourage self massage to tightness    Consulted and Agree with Plan of Care  Patient       Patient will benefit from skilled therapeutic intervention in order to improve the following deficits and impairments:   Abnormal gait, Improper body mechanics, Pain, Increased muscle spasms, Decreased mobility, Decreased activity tolerance, Decreased endurance, Decreased range of motion, Decreased strength, Impaired flexibility, Increased edema, Difficulty walking  Visit Diagnosis: Stiffness of left ankle, not elsewhere classified  Pain in left hip  Pain in left ankle and joints of left foot  Difficulty in walking, not elsewhere classified     Problem List Patient Active Problem List   Diagnosis Date Noted  . Aortic valve regurgitation 06/24/2019  . Urinary incontinence 06/24/2019  . Parapelvic renal cyst 06/24/2019  . Chronic shoulder bursitis, left 06/24/2019  . Arthritis 06/24/2019  . Essential hypertension 03/18/2019  . Mid back pain 03/18/2019  . Muscle weakness (generalized) 03/18/2019  . Pleuritic chest pain 03/18/2019  . Menorrhagia 08/10/2018  . Vitamin D deficiency 12/17/2017  . Iron deficiency anemia 11/13/2017  . Alopecia 11/03/2017  . Seborrheic dermatitis of scalp 11/03/2017  . Cyst of ovary 11/03/2017  . Atypical squamous cells of undetermined significance on cytologic smear of cervix (ASC-US) 11/03/2017  . Right foot pain 11/03/2017  . Thrombocytosis ([place]) 10/31/2017  . Leg edema, left 06/11/2013  . Petechiae 06/11/2013  . LGSIL (low grade squamous intraepithelial dysplasia)   . Reflux   . Hyperlipidemia 04/23/2007  . GAD (generalized anxiety disorder) 04/23/2007  . DEPRESSION 04/23/2007  . GERD 04/23/2007  . HYPERTENSION, GESTATIONAL 04/23/2007  . GESTATIONAL DIABETES 04/23/2007  . DE QUERVAIN'S TENOSYNOVITIS 04/23/2007    Sumner Boast., PT 10/05/2019, 2:22 PM  [place] [address] [place], [place], [address] Phone: [phone]   Fax:  [phone]  Name: [name] MRN: [id number] Date of Birth: [birth year]

## 2019-10-05 NOTE — Telephone Encounter (Signed)
I called patient & let her know that letter was printed put upfront for pick up.

## 2019-10-06 ENCOUNTER — Telehealth: Payer: Self-pay | Admitting: Lab

## 2019-10-06 NOTE — Telephone Encounter (Signed)
Pt would like a referral for Dermatology and Neurology. The ones that she had is outdated and she was never able to go due to her work schedule. Pt stated her scalp has not gotten better.

## 2019-10-06 NOTE — Addendum Note (Signed)
Addended by: Orland Mustard on: 10/06/2019 09:14 PM   Modules accepted: Orders

## 2019-10-07 ENCOUNTER — Other Ambulatory Visit: Payer: Self-pay

## 2019-10-07 ENCOUNTER — Ambulatory Visit: Payer: PRIVATE HEALTH INSURANCE | Admitting: Physical Therapy

## 2019-10-07 ENCOUNTER — Encounter: Payer: Self-pay | Admitting: Physical Therapy

## 2019-10-07 DIAGNOSIS — M25672 Stiffness of left ankle, not elsewhere classified: Secondary | ICD-10-CM | POA: Diagnosis not present

## 2019-10-07 DIAGNOSIS — R600 Localized edema: Secondary | ICD-10-CM

## 2019-10-07 DIAGNOSIS — R262 Difficulty in walking, not elsewhere classified: Secondary | ICD-10-CM

## 2019-10-07 DIAGNOSIS — M25572 Pain in left ankle and joints of left foot: Secondary | ICD-10-CM

## 2019-10-07 DIAGNOSIS — M25552 Pain in left hip: Secondary | ICD-10-CM

## 2019-10-07 NOTE — Therapy (Signed)
[address] [place], [place], [address] Phone: [phone]   Fax:  [phone]  Physical Therapy Treatment  Patient Details  Name: [name] MRN: [id number] Date of Birth: 1970-09-06 Referring Provider (PT): Pershing Cox Date: 10/07/2019  PT End of Session - 10/07/19 1013    Visit Number  12    Authorization Type  W/C    PT Start Time  6734    PT Stop Time  1015    PT Time Calculation (min)  31 min       Past Medical History:  Diagnosis Date  . Anemia   . Anxiety   . ASCUS favoring benign 12/2015   negative high risk HPV  . Chicken pox   . GERD (gastroesophageal reflux disease)    during pregnancy   . 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 Hyperlipidemia   . Hypertension    during pregnancy   . 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 Reflux     Past Surgical History:  Procedure Laterality Date  . COLPOSCOPY    . LAPAROSCOPIC VAGINAL HYSTERECTOMY WITH SALPINGO OOPHORECTOMY Bilateral 08/10/2018   Procedure: LAPAROSCOPIC ASSISTED VAGINAL HYSTERECTOMY WITH BILATERAL SALPINGO OOPHORECTOMY;  Surgeon: [name], MD;  Location: [facility];  Service: Gynecology;  Laterality: Bilateral;  . TONSILLECTOMY AND ADENOIDECTOMY      There were no vitals filed for this visit.  Subjective Assessment - 10/07/19 0948    Subjective  "Donnald Garre been hurting"    Currently in Pain?  Yes    Pain Score  4     Pain Location  Ankle    Pain Orientation  Left                       [facility] Adult PT Treatment/Exercise - 10/07/19 0001      Ambulation/Gait   Stairs  Yes    Stairs Assistance  6: Modified independent (Device/Increase time)    Stair Management Technique  No rails;Alternating pattern    Number of Stairs  48    Height of Stairs  6    Gait Comments  R ankle pain reported      Manual Therapy   Manual Therapy  Soft tissue  mobilization;Passive ROM    Manual therapy comments  gentle mid foot joint mobs    Soft tissue mobilization  light gentle STM to the left anterior tib, the PF and some in the gastroc    Passive ROM  PROMleft ankle, passive stretch all the motions, passive HS and piriformis stretches, PF stretches      Ankle Exercises: Aerobic   Elliptical  I11  R5 x2 min each way                PT Short Term Goals - 09/07/19 1440      PT SHORT TERM GOAL #1   Title  She will be independent with inital HEP.     Status  Partially Met        PT Long Term Goals - 09/30/19 1455      PT LONG TERM GOAL #3   Title  50% less left ankle pain    Status  Partially Met            Plan - 10/07/19 1013    Clinical Impression Statement  Pt 14 minutes late for today's  session. She reports increase pain in lower leg throughout session. Pain appeared to move around in L ankle with resisted gait. Good mobility with stair negotiation. Reports ankle soreness and pain with passive motions.    Stability/Clinical Decision Making  Stable/Uncomplicated    Rehab Potential  Good    PT Treatment/Interventions  ADLs/Self Care Home Management;Cryotherapy;Electrical Stimulation;Moist Heat;Ultrasound;DME Instruction;Therapeutic activities;Therapeutic exercise;Balance training;Neuromuscular re-education;Manual techniques;Patient/family education;Vasopneumatic Device    PT Next Visit Plan  Ankle stability and ROM. encourage self massage to tightness       Patient will benefit from skilled therapeutic intervention in order to improve the following deficits and impairments:  Abnormal gait, Improper body mechanics, Pain, Increased muscle spasms, Decreased mobility, Decreased activity tolerance, Decreased endurance, Decreased range of motion, Decreased strength, Impaired flexibility, Increased edema, Difficulty walking  Visit Diagnosis: Pain in left ankle and joints of left foot  Difficulty in walking, not elsewhere  classified  Localized edema  Pain in left hip  Stiffness of left ankle, not elsewhere classified     Problem List Patient Active Problem List   Diagnosis Date Noted  . Aortic valve regurgitation 06/24/2019  . Urinary incontinence 06/24/2019  . Parapelvic renal cyst 06/24/2019  . Chronic shoulder bursitis, left 06/24/2019  . Arthritis 06/24/2019  . Essential hypertension 03/18/2019  . Mid back pain 03/18/2019  . Muscle weakness (generalized) 03/18/2019  . Pleuritic chest pain 03/18/2019  . Menorrhagia 08/10/2018  . Vitamin D deficiency 12/17/2017  . Iron deficiency anemia 11/13/2017  . Alopecia 11/03/2017  . Seborrheic dermatitis of scalp 11/03/2017  . Cyst of ovary 11/03/2017  . Atypical squamous cells of undetermined significance on cytologic smear of cervix (ASC-US) 11/03/2017  . Right foot pain 11/03/2017  . Thrombocytosis ([place]) 10/31/2017  . Leg edema, left 06/11/2013  . Petechiae 06/11/2013  . LGSIL (low grade squamous intraepithelial dysplasia)   . Reflux   . Hyperlipidemia 04/23/2007  . GAD (generalized anxiety disorder) 04/23/2007  . DEPRESSION 04/23/2007  . GERD 04/23/2007  . HYPERTENSION, GESTATIONAL 04/23/2007  . GESTATIONAL DIABETES 04/23/2007  . DE QUERVAIN'S TENOSYNOVITIS 04/23/2007    Scot Jun, PTA 10/07/2019, 10:16 AM  [address] [place], [place], [address] Phone: [phone]   Fax:  [phone]  Name: LEMMA TETRO MRN: [id number] Date of Birth: 11-Jul-1971

## 2019-10-11 ENCOUNTER — Other Ambulatory Visit: Payer: Self-pay

## 2019-10-11 ENCOUNTER — Ambulatory Visit: Payer: PRIVATE HEALTH INSURANCE | Attending: Orthopaedic Surgery | Admitting: Physical Therapy

## 2019-10-11 ENCOUNTER — Encounter: Payer: Self-pay | Admitting: Physical Therapy

## 2019-10-11 DIAGNOSIS — M25572 Pain in left ankle and joints of left foot: Secondary | ICD-10-CM | POA: Diagnosis present

## 2019-10-11 DIAGNOSIS — R600 Localized edema: Secondary | ICD-10-CM

## 2019-10-11 DIAGNOSIS — R262 Difficulty in walking, not elsewhere classified: Secondary | ICD-10-CM

## 2019-10-11 DIAGNOSIS — M25552 Pain in left hip: Secondary | ICD-10-CM

## 2019-10-11 DIAGNOSIS — M25672 Stiffness of left ankle, not elsewhere classified: Secondary | ICD-10-CM

## 2019-10-11 NOTE — Therapy (Signed)
[facility] [address] [place], [place], [address] Phone: [phone]   Fax:  [phone]  Physical Therapy Treatment  Patient Details  Name: [name] MRN: [id number] Date of Birth: September 22, 1970 Referring Provider (PT): Pershing Cox Date: 10/11/2019  PT End of Session - 10/11/19 1010    Visit Number  13    PT Start Time  2951    PT Stop Time  1019    PT Time Calculation (min)  41 min       Past Medical History:  Diagnosis Date  . Anemia   . Anxiety   . ASCUS favoring benign 12/2015   negative high risk HPV  . Chicken pox   . GERD (gastroesophageal reflux disease)    during pregnancy   . 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 Hyperlipidemia   . Hypertension    during pregnancy   . 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 Reflux     Past Surgical History:  Procedure Laterality Date  . COLPOSCOPY    . LAPAROSCOPIC VAGINAL HYSTERECTOMY WITH SALPINGO OOPHORECTOMY Bilateral 08/10/2018   Procedure: LAPAROSCOPIC ASSISTED VAGINAL HYSTERECTOMY WITH BILATERAL SALPINGO OOPHORECTOMY;  Surgeon: [name], MD;  Location: [facility];  Service: Gynecology;  Laterality: Bilateral;  . TONSILLECTOMY AND ADENOIDECTOMY      There were no vitals filed for this visit.  Subjective Assessment - 10/11/19 0940    Subjective  "Im good, I mean overall Im good"    Currently in Pain?  Yes    Pain Score  5     Pain Location  Ankle    Pain Orientation  Left                       [facility] Adult PT Treatment/Exercise - 10/11/19 0001      High Level Balance   High Level Balance Comments  SLS LLE 3x10 sec      Moist Heat Therapy   Number Minutes Moist Heat  10 Minutes    Moist Heat Location  Ankle      Ankle Exercises: Aerobic   Elliptical  I7 R 3 2 min each     Recumbent Bike  L1 x 4 min       Ankle Exercises: Supine   T-Band  all tband red 2x10  with a lot of cues and assist to keep from compensation      Ankle Exercises: Seated   Other Seated Ankle Exercises  step on and off airex and over bolsters      Ankle Exercises: Standing   Other Standing Ankle Exercises  resisted gait all directions    Other Standing Ankle Exercises  heel raises black bar 2x10, Toe and heel walking               PT Short Term Goals - 09/07/19 1440      PT SHORT TERM GOAL #1   Title  She will be independent with inital HEP.     Status  Partially Met        PT Long Term Goals - 09/30/19 1455      PT LONG TERM GOAL #3   Title  50% less left ankle pain    Status  Partially Met            Plan - 10/11/19 1011    Clinical  Impression Statement  Pt 8 minutes late. She reports calf and anterior tib pain with activity. Little Toe elevation noted with heel walking. She did well with single leg stance. Constant cue needed using eccentric phase with resisted walking.    Stability/Clinical Decision Making  Stable/Uncomplicated    Rehab Potential  Good    PT Frequency  2x / week    PT Treatment/Interventions  ADLs/Self Care Home Management;Cryotherapy;Electrical Stimulation;Moist Heat;Ultrasound;DME Instruction;Therapeutic activities;Therapeutic exercise;Balance training;Neuromuscular re-education;Manual techniques;Patient/family education;Vasopneumatic Device    PT Next Visit Plan  Ankle stability and ROM. encourage self massage to tightness       Patient will benefit from skilled therapeutic intervention in order to improve the following deficits and impairments:  Abnormal gait, Improper body mechanics, Pain, Increased muscle spasms, Decreased mobility, Decreased activity tolerance, Decreased endurance, Decreased range of motion, Decreased strength, Impaired flexibility, Increased edema, Difficulty walking  Visit Diagnosis: Pain in left ankle and joints of left foot  Localized edema  Stiffness of left ankle, not elsewhere  classified  Pain in left hip  Difficulty in walking, not elsewhere classified     Problem List Patient Active Problem List   Diagnosis Date Noted  . Aortic valve regurgitation 06/24/2019  . Urinary incontinence 06/24/2019  . Parapelvic renal cyst 06/24/2019  . Chronic shoulder bursitis, left 06/24/2019  . Arthritis 06/24/2019  . Essential hypertension 03/18/2019  . Mid back pain 03/18/2019  . Muscle weakness (generalized) 03/18/2019  . Pleuritic chest pain 03/18/2019  . Menorrhagia 08/10/2018  . Vitamin D deficiency 12/17/2017  . Iron deficiency anemia 11/13/2017  . Alopecia 11/03/2017  . Seborrheic dermatitis of scalp 11/03/2017  . Cyst of ovary 11/03/2017  . Atypical squamous cells of undetermined significance on cytologic smear of cervix (ASC-US) 11/03/2017  . Right foot pain 11/03/2017  . Thrombocytosis ([place]) 10/31/2017  . Leg edema, left 06/11/2013  . Petechiae 06/11/2013  . LGSIL (low grade squamous intraepithelial dysplasia)   . Reflux   . Hyperlipidemia 04/23/2007  . GAD (generalized anxiety disorder) 04/23/2007  . DEPRESSION 04/23/2007  . GERD 04/23/2007  . HYPERTENSION, GESTATIONAL 04/23/2007  . GESTATIONAL DIABETES 04/23/2007  . DE QUERVAIN'S TENOSYNOVITIS 04/23/2007    Scot Jun, PTA 10/11/2019, 10:20 AM  [address] [place], [place], [address] Phone: [phone]   Fax:  [phone]  Name: [name] MRN: [id number] Date of Birth: July 17, 1971

## 2019-10-13 ENCOUNTER — Other Ambulatory Visit (HOSPITAL_COMMUNITY): Payer: Self-pay | Admitting: Orthopaedic Surgery

## 2019-10-13 ENCOUNTER — Encounter: Payer: Self-pay | Admitting: Physical Therapy

## 2019-10-13 ENCOUNTER — Other Ambulatory Visit: Payer: Self-pay

## 2019-10-13 ENCOUNTER — Other Ambulatory Visit: Payer: Self-pay | Admitting: Internal Medicine

## 2019-10-13 ENCOUNTER — Ambulatory Visit: Payer: PRIVATE HEALTH INSURANCE | Attending: Orthopaedic Surgery | Admitting: Physical Therapy

## 2019-10-13 ENCOUNTER — Telehealth: Payer: Self-pay | Admitting: Internal Medicine

## 2019-10-13 DIAGNOSIS — M25672 Stiffness of left ankle, not elsewhere classified: Secondary | ICD-10-CM | POA: Diagnosis present

## 2019-10-13 DIAGNOSIS — M25572 Pain in left ankle and joints of left foot: Secondary | ICD-10-CM | POA: Insufficient documentation

## 2019-10-13 DIAGNOSIS — R600 Localized edema: Secondary | ICD-10-CM | POA: Diagnosis present

## 2019-10-13 NOTE — Telephone Encounter (Signed)
Pt states that she needs a referral sent to the [facility] for a diagnostic mammogram instead of a regular referral?? Pt has been having pain in left breast/nipple. Please advise.

## 2019-10-13 NOTE — Therapy (Signed)
[address] [place], [place], [address] Phone: [phone]   Fax:  [phone]  Physical Therapy Treatment  Patient Details  Name: [name] MRN: [id number] Date of Birth: 08/10/1971 Referring Provider (PT): Pershing Cox Date: 10/13/2019  PT End of Session - 10/13/19 1013    Visit Number  14    Authorization Type  W/C    PT Start Time  0937    PT Stop Time  1022    PT Time Calculation (min)  45 min    Activity Tolerance  Patient tolerated treatment well;Patient limited by pain    Behavior During Therapy  [facility] for tasks assessed/performed       Past Medical History:  Diagnosis Date  . Anemia   . Anxiety   . ASCUS favoring benign 12/2015   negative high risk HPV  . Chicken pox   . GERD (gastroesophageal reflux disease)    during pregnancy   . 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 Hyperlipidemia   . Hypertension    during pregnancy   . 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 Reflux     Past Surgical History:  Procedure Laterality Date  . COLPOSCOPY    . LAPAROSCOPIC VAGINAL HYSTERECTOMY WITH SALPINGO OOPHORECTOMY Bilateral 08/10/2018   Procedure: LAPAROSCOPIC ASSISTED VAGINAL HYSTERECTOMY WITH BILATERAL SALPINGO OOPHORECTOMY;  Surgeon: [name], MD;  Location: [facility];  Service: Gynecology;  Laterality: Bilateral;  . TONSILLECTOMY AND ADENOIDECTOMY      There were no vitals filed for this visit.  Subjective Assessment - 10/13/19 0939    Subjective  "I hurt on my heel and underneath "    Currently in Pain?  Yes    Pain Score  4     Pain Location  Foot    Pain Orientation  Left         [facility] PT Assessment - 10/13/19 0001      AROM   Left Ankle Dorsiflexion  8    Left Ankle Plantar Flexion  50    Left Ankle Inversion  31    Left Ankle Eversion  25                   [facility] Adult PT Treatment/Exercise  - 10/13/19 0001      Moist Heat Therapy   Number Minutes Moist Heat  10 Minutes    Moist Heat Location  Ankle      Ankle Exercises: Aerobic   Elliptical  I11 R 5 2 min each     Recumbent Bike  L1 x 4 min       Ankle Exercises: Supine   T-Band  all tband red 2x10 with a lot of cues and assist to keep from compensation      Ankle Exercises: Standing   Other Standing Ankle Exercises  resisted gait all directions    Other Standing Ankle Exercises  Standing on BOSU touching targets some instability      Ankle Exercises: Seated   Other Seated Ankle Exercises  Sit to stand LE on dyna disk  2x10               PT Short Term Goals - 09/07/19 1440      PT SHORT TERM GOAL #1   Title  She will be independent with inital HEP.     Status  Partially  Met        PT Long Term Goals - 09/30/19 1455      PT LONG TERM GOAL #3   Title  50% less left ankle pain    Status  Partially Met            Plan - 10/13/19 1017    Clinical Impression Statement  Pt ~ 7 minutes late today. She ambulated fine but reports  ankle and lower shaft pain with activity. Burning reported with resisted ankle interventions. Pt has good ankle synergies but reports pain on non compliant surfaces. Performed resisted gait, cues needed to control the eccentric phase . Pain reported with resisted side step.    Stability/Clinical Decision Making  Stable/Uncomplicated    Rehab Potential  Good    PT Frequency  2x / week    PT Duration  4 weeks    PT Treatment/Interventions  ADLs/Self Care Home Management;Cryotherapy;Electrical Stimulation;Moist Heat;Ultrasound;DME Instruction;Therapeutic activities;Therapeutic exercise;Balance training;Neuromuscular re-education;Manual techniques;Patient/family education;Vasopneumatic Device    PT Next Visit Plan  Ankle stability and ROM. encourage self massage to tightness, Sees MD today       Patient will benefit from skilled therapeutic intervention in order to improve the  following deficits and impairments:  Abnormal gait, Improper body mechanics, Pain, Increased muscle spasms, Decreased mobility, Decreased activity tolerance, Decreased endurance, Decreased range of motion, Decreased strength, Impaired flexibility, Increased edema, Difficulty walking  Visit Diagnosis: Pain in left ankle and joints of left foot  Localized edema  Stiffness of left ankle, not elsewhere classified     Problem List Patient Active Problem List   Diagnosis Date Noted  . Aortic valve regurgitation 06/24/2019  . Urinary incontinence 06/24/2019  . Parapelvic renal cyst 06/24/2019  . Chronic shoulder bursitis, left 06/24/2019  . Arthritis 06/24/2019  . Essential hypertension 03/18/2019  . Mid back pain 03/18/2019  . Muscle weakness (generalized) 03/18/2019  . Pleuritic chest pain 03/18/2019  . Menorrhagia 08/10/2018  . Vitamin D deficiency 12/17/2017  . Iron deficiency anemia 11/13/2017  . Alopecia 11/03/2017  . Seborrheic dermatitis of scalp 11/03/2017  . Cyst of ovary 11/03/2017  . Atypical squamous cells of undetermined significance on cytologic smear of cervix (ASC-US) 11/03/2017  . Right foot pain 11/03/2017  . Thrombocytosis ([place]) 10/31/2017  . Leg edema, left 06/11/2013  . Petechiae 06/11/2013  . LGSIL (low grade squamous intraepithelial dysplasia)   . Reflux   . Hyperlipidemia 04/23/2007  . GAD (generalized anxiety disorder) 04/23/2007  . DEPRESSION 04/23/2007  . GERD 04/23/2007  . HYPERTENSION, GESTATIONAL 04/23/2007  . GESTATIONAL DIABETES 04/23/2007  . DE QUERVAIN'S TENOSYNOVITIS 04/23/2007    Scot Jun, PTA 10/13/2019, 10:24 AM  [address] [address] [place], [place], [address] Phone: [phone]   Fax:  [phone]  Name: [name] MRN: [id number] Date of Birth: Mar 07, 1971

## 2019-10-13 NOTE — Addendum Note (Signed)
Addended by: Orland Mustard on: 10/13/2019 11:44 PM   Modules accepted: Orders

## 2019-10-13 NOTE — Telephone Encounter (Signed)
Referral placed left breast diagnostic mammogram [facility] and right breast screening  Referrals denied dermatology, neurology  Pt needs to call insurance see who is in network other then [facility] and Dr. Denna Haggard and let me know who she wants to see    Thanks tMS

## 2019-10-15 ENCOUNTER — Other Ambulatory Visit: Payer: Self-pay | Admitting: Internal Medicine

## 2019-10-15 DIAGNOSIS — N644 Mastodynia: Secondary | ICD-10-CM

## 2019-10-15 DIAGNOSIS — Z1231 Encounter for screening mammogram for malignant neoplasm of breast: Secondary | ICD-10-CM

## 2019-10-21 ENCOUNTER — Ambulatory Visit (HOSPITAL_COMMUNITY): Admission: RE | Admit: 2019-10-21 | Payer: PRIVATE HEALTH INSURANCE | Source: Ambulatory Visit

## 2019-10-28 ENCOUNTER — Ambulatory Visit
Admission: RE | Admit: 2019-10-28 | Discharge: 2019-10-28 | Disposition: A | Discharge status: Home / Self Care | Payer: No Typology Code available for payment source | Source: Ambulatory Visit | Attending: Internal Medicine | Admitting: Internal Medicine

## 2019-10-28 ENCOUNTER — Other Ambulatory Visit: Payer: Self-pay

## 2019-10-28 ENCOUNTER — Ambulatory Visit: Payer: No Typology Code available for payment source

## 2019-10-28 DIAGNOSIS — N644 Mastodynia: Secondary | ICD-10-CM

## 2019-10-28 DIAGNOSIS — Z1231 Encounter for screening mammogram for malignant neoplasm of breast: Secondary | ICD-10-CM

## 2019-10-29 ENCOUNTER — Telehealth (INDEPENDENT_AMBULATORY_CARE_PROVIDER_SITE_OTHER): Payer: No Typology Code available for payment source | Admitting: Internal Medicine

## 2019-10-29 ENCOUNTER — Encounter: Payer: Self-pay | Admitting: Internal Medicine

## 2019-10-29 VITALS — Ht 62.0 in | Wt 175.0 lb

## 2019-10-29 DIAGNOSIS — Z1329 Encounter for screening for other suspected endocrine disorder: Secondary | ICD-10-CM

## 2019-10-29 DIAGNOSIS — M79673 Pain in unspecified foot: Secondary | ICD-10-CM

## 2019-10-29 DIAGNOSIS — E785 Hyperlipidemia, unspecified: Secondary | ICD-10-CM

## 2019-10-29 DIAGNOSIS — D508 Other iron deficiency anemias: Secondary | ICD-10-CM

## 2019-10-29 DIAGNOSIS — M25571 Pain in right ankle and joints of right foot: Secondary | ICD-10-CM

## 2019-10-29 DIAGNOSIS — I351 Nonrheumatic aortic (valve) insufficiency: Secondary | ICD-10-CM

## 2019-10-29 DIAGNOSIS — M79605 Pain in left leg: Secondary | ICD-10-CM | POA: Insufficient documentation

## 2019-10-29 DIAGNOSIS — G8929 Other chronic pain: Secondary | ICD-10-CM | POA: Insufficient documentation

## 2019-10-29 DIAGNOSIS — L659 Nonscarring hair loss, unspecified: Secondary | ICD-10-CM

## 2019-10-29 DIAGNOSIS — Z1283 Encounter for screening for malignant neoplasm of skin: Secondary | ICD-10-CM

## 2019-10-29 DIAGNOSIS — M25579 Pain in unspecified ankle and joints of unspecified foot: Secondary | ICD-10-CM

## 2019-10-29 DIAGNOSIS — E559 Vitamin D deficiency, unspecified: Secondary | ICD-10-CM

## 2019-10-29 DIAGNOSIS — I10 Essential (primary) hypertension: Secondary | ICD-10-CM

## 2019-10-29 DIAGNOSIS — L299 Pruritus, unspecified: Secondary | ICD-10-CM

## 2019-10-29 NOTE — Progress Notes (Signed)
Virtual Visit via Video Note  I connected with [name]  on 10/29/19 at 10:28 AM EST by a video enabled telemedicine application and verified that I am speaking with the correct person using two identifiers.  Location patient: car Location provider:work or home office Persons participating in the virtual visit: patient, provider  I discussed the limitations of evaluation and management by telemedicine and the availability of in person appointments. The patient expressed understanding and agreed to proceed.   HPI: 1. AVR mild to moderate, HTN. HLD on Crestor 20 mg qhs on coreg 3.125 mg bid will f/u with Cards Dr. Debara Pickett  2. She has been out of work x 3 months due to work related injury on workers comp since 07/2019  Due to right ankle injury stepped on curb and ankle rolled  She is seeing [facility] orth[facility] Dr. Latina Craver and currently on restrictions with only office duty but is complaining of pain (left sided) leg radiating and calf and hamstring, buttocks and thigh moderate to severe and wants ortho to address this as well currently they are only addressing her ankle pain 9/10 at times sometimes time pain level tried otc meds w/o relief    ROS: See pertinent positives and negatives per HPI.  Past Medical History:  Diagnosis Date  . Anemia   . Anxiety   . ASCUS favoring benign 12/2015   negative high risk HPV  . Chicken pox   . COVID-19 december  . GERD (gastroesophageal reflux disease)    during pregnancy   . 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 Hyperlipidemia   . Hypertension    during pregnancy   . 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 Reflux     Past Surgical History:  Procedure Laterality Date  . COLPOSCOPY    . LAPAROSCOPIC VAGINAL HYSTERECTOMY WITH SALPINGO OOPHORECTOMY Bilateral 08/10/2018   Procedure: LAPAROSCOPIC ASSISTED VAGINAL HYSTERECTOMY WITH BILATERAL SALPINGO OOPHORECTOMY;  Surgeon: [name], MD;  Location: [facility];  Service: Gynecology;  Laterality: Bilateral;  . TONSILLECTOMY AND ADENOIDECTOMY      Family History  Problem Relation Age of Onset  . Hypertension Mother   . Cancer Mother        LUNG CANCER/SMOKER  . Depression Mother   . Mental illness Mother   . Diabetes Father   . Hypertension Father   . Heart disease Father   . Hyperlipidemia Father   . Stroke Father   . Hypertension Brother   . Diabetes Brother   . Depression Brother   . Hyperlipidemia Brother   . Cancer Maternal Uncle        ESOPHAGEAL CA  . Diabetes Cousin   . Heart disease Cousin   . Cancer Maternal Grandfather        Brain  . Stroke Paternal Grandmother   . Depression Maternal Grandmother   . Mental retardation Maternal Grandmother   . 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  carvedilol (COREG) 3.125 MG tablet, Take 1 tablet (3.125 mg total) by mouth 2 (two) times daily with a meal., Disp: 180 tablet, Rfl: 3 .  meloxicam (MOBIC) 7.5 MG tablet, Take 1 tablet (7.5 mg total) by mouth daily., Disp: 10 tablet, Rfl: 0 .  rosuvastatin (CRESTOR) 20 MG tablet, Take 1 tablet (20 mg total) by mouth daily., Disp: 90 tablet, Rfl:  3 .  VITAMIN D PO, Take 5,000 Units by mouth daily. , Disp: , Rfl:  .  albuterol (VENTOLIN HFA) 108 (90 Base) MCG/ACT inhaler, Inhale 1-2 puffs into the lungs every 6 (six) hours as needed for wheezing or shortness of breath. (Patient not taking: Reported on 10/29/2019), Disp: 18 g, Rfl: 2 .  cyclobenzaprine (FLEXERIL) 5 MG tablet, Take 1 tablet (5 mg total) by mouth at bedtime as needed for muscle spasms. (Patient not taking: Reported on 10/29/2019), Disp: 30 tablet, Rfl: 2  EXAM:  VITALS per patient if applicable:  GENERAL: alert, oriented, appears well and in no acute distress  HEENT: atraumatic, conjunttiva clear, no obvious abnormalities on inspection of external nose and ears  NECK:  normal movements of the head and neck  LUNGS: on inspection no signs of respiratory distress, breathing rate appears normal, no obvious gross SOB, gasping or wheezing  CV: no obvious cyanosis  MS: moves all visible extremities without noticeable abnormality  PSYCH/NEURO: pleasant and cooperative, no obvious depression or anxiety, speech and thought processing grossly intact  ASSESSMENT AND PLAN:  Discussed the following assessment and plan:  Essential hypertension -  Hyperlipidemia, unspecified hyperlipidemia type -  Mild to mod MVR Cont meds  Fasting labs  F/u cards Dr.Hilty  Vitamin D deficiency - Plan: VITAMIN D 25 Hydroxy (Vit-D Deficiency, Fractures)  Other iron deficiency anemia - Plan: CBC with Differential/Platelet, Iron, TIBC and Ferritin Panel   Chronic pain of right ankle Left leg pain CC Dr. Elwyn Lade ortho pt wants left leg pain calf, thigh buttock addressed as well as right ankle please and may need further imaging if deemed needed by ortho I.e MRI right ankle   HM Flu shot had 06/23/19 Tdap per pt had 02/2016 need to confirm atf/u Consider covid 19 vaccine   DEXA 03/09/18 normal Pap 02/13/18 negative s/p hysterectomy 08/2018 fibroids and benign right ovarian cyst  -h/o abnormal pap LMP 10/19/17 Dr. Donalynn Furlong [facility] in [place] -obtained pap 12/06/15 ASCUS neg hpv; h/o persistent LGSIL 2004-2006 with neg pap afterwards  -also menorrhagia/breakthrough bleeding/right solid ovarian mass/endocmetrial polp noted 12/06/15 rec hysterectomy at that time and never f/u on this vs [place] and endometrial sampling if polyp then hysteroscopy with D&C with resection of polyp endometrial bx in 2012 benign fragments of endometrial polyp   Colonoscopy consider  Skin still needs to schedule  mammo 10/28/19 normal   Of note pt declined from [facility], [facility] neurolog[facility] for referrals and Derm Dr. Delma Officer office in [place] she is to call back when finds who will take her as pt in  [employer] network for these referrals   Will get records eye [facility] [place] Dr. Melissa Noon per pt seeing allergies/inflammation given steroid eye drops for pressure in left eye and thick film in eyes   -we discussed possible serious and likely etiologies, options for evaluation and workup, limitations of telemedicine visit vs in person visit, treatment, treatment risks and precautions. Pt prefers to treat via telemedicine empirically rather then risking or undertaking an in person visit at this moment. Patient agrees to seek prompt in person care if worsening, new symptoms arise, or if is not improving with treatment.   I discussed the assessment and treatment plan with the patient. The patient was provided an opportunity to ask questions and all were answered. The patient agreed with the plan and demonstrated an understanding of the instructions.   The patient was advised to call back or seek an in-person evaluation if the symptoms  worsen or if the condition fails to improve as anticipated.  Time spent 25 minutes Delorise Jackson, MD

## 2019-10-30 ENCOUNTER — Ambulatory Visit (HOSPITAL_BASED_OUTPATIENT_CLINIC_OR_DEPARTMENT_OTHER)
Admission: RE | Admit: 2019-10-30 | Discharge: 2019-10-30 | Disposition: A | Discharge status: Home / Self Care | Payer: PRIVATE HEALTH INSURANCE | Source: Ambulatory Visit | Attending: Orthopaedic Surgery | Admitting: Orthopaedic Surgery

## 2019-10-30 ENCOUNTER — Other Ambulatory Visit: Payer: Self-pay

## 2019-10-30 ENCOUNTER — Encounter (HOSPITAL_BASED_OUTPATIENT_CLINIC_OR_DEPARTMENT_OTHER): Payer: Self-pay

## 2019-10-30 DIAGNOSIS — M25572 Pain in left ankle and joints of left foot: Secondary | ICD-10-CM | POA: Insufficient documentation

## 2019-11-03 ENCOUNTER — Other Ambulatory Visit (HOSPITAL_BASED_OUTPATIENT_CLINIC_OR_DEPARTMENT_OTHER): Payer: Self-pay | Admitting: Orthopaedic Surgery

## 2019-11-03 ENCOUNTER — Other Ambulatory Visit: Payer: Self-pay | Admitting: Orthopaedic Surgery

## 2019-11-03 DIAGNOSIS — M79605 Pain in left leg: Secondary | ICD-10-CM

## 2019-11-10 ENCOUNTER — Other Ambulatory Visit: Payer: Self-pay

## 2019-11-10 ENCOUNTER — Ambulatory Visit (HOSPITAL_COMMUNITY)
Admission: RE | Admit: 2019-11-10 | Discharge: 2019-11-10 | Disposition: A | Discharge status: Home / Self Care | Payer: PRIVATE HEALTH INSURANCE | Source: Ambulatory Visit | Attending: Orthopaedic Surgery | Admitting: Orthopaedic Surgery

## 2019-11-10 DIAGNOSIS — M79605 Pain in left leg: Secondary | ICD-10-CM | POA: Insufficient documentation

## 2019-11-12 MED FILL — PREDNISOLONE AC 1% EYE DROP: 1 | 5 days supply | Qty: 5 | Fill #0

## 2019-11-23 MED FILL — MELOXICAM 15 MG TABLET: 15 | 30 days supply | Qty: 30 | Fill #0

## 2019-11-26 ENCOUNTER — Ambulatory Visit: Payer: PRIVATE HEALTH INSURANCE | Attending: Orthopaedic Surgery | Admitting: Physical Therapy

## 2019-11-26 ENCOUNTER — Other Ambulatory Visit: Payer: Self-pay

## 2019-11-26 ENCOUNTER — Encounter: Payer: Self-pay | Admitting: Physical Therapy

## 2019-11-26 DIAGNOSIS — R262 Difficulty in walking, not elsewhere classified: Secondary | ICD-10-CM

## 2019-11-26 DIAGNOSIS — M25672 Stiffness of left ankle, not elsewhere classified: Secondary | ICD-10-CM | POA: Diagnosis present

## 2019-11-26 DIAGNOSIS — R600 Localized edema: Secondary | ICD-10-CM

## 2019-11-26 DIAGNOSIS — M25572 Pain in left ankle and joints of left foot: Secondary | ICD-10-CM
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
## 2019-11-30 ENCOUNTER — Encounter: Payer: Self-pay | Admitting: Physical Therapy

## 2019-11-30 ENCOUNTER — Ambulatory Visit: Payer: PRIVATE HEALTH INSURANCE | Attending: Orthopaedic Surgery | Admitting: Physical Therapy

## 2019-11-30 ENCOUNTER — Other Ambulatory Visit: Payer: Self-pay

## 2019-11-30 DIAGNOSIS — M25572 Pain in left ankle and joints of left foot: Secondary | ICD-10-CM | POA: Diagnosis present

## 2019-11-30 DIAGNOSIS — R262 Difficulty in walking, not elsewhere classified: Secondary | ICD-10-CM | POA: Insufficient documentation

## 2019-11-30 DIAGNOSIS — R600 Localized edema: Secondary | ICD-10-CM | POA: Diagnosis present

## 2019-11-30 DIAGNOSIS — M25672 Stiffness of left ankle, not elsewhere classified: Secondary | ICD-10-CM | POA: Insufficient documentation

## 2019-11-30 NOTE — Therapy (Signed)
[place] [address] [place], [place], [address] Phone: [phone]   Fax:  [phone]  Physical Therapy Treatment  Patient Details  Name: [name] MRN: [id number] Date of Birth: 11-Jun-1971 Referring Provider (PT): Pershing Cox Date: 11/30/2019  PT End of Session - 11/30/19 1744    Visit Number  2    Number of Visits  12    Date for PT Re-Evaluation  12/27/19    Authorization Type  W/C    PT Start Time  [id number]    PT Stop Time  1704    PT Time Calculation (min)  43 min    Activity Tolerance  Patient limited by pain    Behavior During Therapy  Anxious;Agitated       Past Medical History:  Diagnosis Date  . Anemia   . Anxiety   . ASCUS favoring benign 12/2015   negative high risk HPV  . Chicken pox   . GERD (gastroesophageal reflux disease)    during pregnancy   . 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 Hyperlipidemia   . Hypertension    during pregnancy   . 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 Reflux     Past Surgical History:  Procedure Laterality Date  . COLPOSCOPY    . LAPAROSCOPIC VAGINAL HYSTERECTOMY WITH SALPINGO OOPHORECTOMY Bilateral 08/10/2018   Procedure: LAPAROSCOPIC ASSISTED VAGINAL HYSTERECTOMY WITH BILATERAL SALPINGO OOPHORECTOMY;  Surgeon: [name], MD;  Location: [facility];  Service: Gynecology;  Laterality: Bilateral;  . TONSILLECTOMY AND ADENOIDECTOMY      There were no vitals filed for this visit.  Subjective Assessment - 11/30/19 1627    Subjective  Patient reports that she had to stand and walk some today at work, reports pain a 4/10 now, she was using the boot, she reports that over teh weekend she was able to stay off of it and it felt better    Currently in Pain?  Yes    Pain Score  5     Pain Location  Foot    Pain Orientation  Left    Aggravating Factors   standing and walking                        [facility] Adult PT Treatment/Exercise - 11/30/19 0001      Manual Therapy   Manual Therapy  Soft tissue mobilization    Manual therapy comments  gentle mid foot joint mobs, toe mobilizations    Soft tissue mobilization  STM to the calf and the anterior tibialis    Passive ROM  PROMleft ankle, passive stretch all the motions, passive , PF stretches      Ankle Exercises: Seated   ABC's  2 reps    Ankle Circles/Pumps  20 reps    Towel Inversion/Eversion  5 reps    [place] Pickup  10x               PT Short Term Goals - 11/26/19 1153      PT SHORT TERM GOAL #1   Title  She will be independent with inital HEP.     Time  2    Period  Weeks    Status  New        PT Long Term Goals - 11/26/19 1153      PT LONG TERM GOAL #1  Title  She will be independent with all HEP issued as of last visit    Time  8    Period  Weeks    Status  New      PT LONG TERM GOAL #2   Title  decresae left ankle pain 50%    Time  8    Period  Weeks    Status  New      PT LONG TERM GOAL #3   Title  increase left ankle DF to 10 degrees    Time  8    Period  Weeks    Status  New      PT LONG TERM GOAL #4   Title  walk without difficulty    Time  8    Period  Weeks    Status  New      PT LONG TERM GOAL #5   Title  return to work with no restrictions    Time  8    Period  Weeks    Status  New            Plan - 11/30/19 1745    Clinical Impression Statement  Patient very gaurded and tender in the anterior tib and the calf, tried some gentle mid foot and toe mobilizations, very tender over the 5th metatarsal    PT Next Visit Plan  start out trying to gain ROM and decrease the spasms    Consulted and Agree with Plan of Care  Patient       Patient will benefit from skilled therapeutic intervention in order to improve the following deficits and impairments:  Abnormal gait, Improper body mechanics, Pain, Increased muscle spasms, Decreased mobility,  Decreased activity tolerance, Decreased endurance, Decreased range of motion, Decreased strength, Impaired flexibility, Increased edema, Difficulty walking  Visit Diagnosis: Pain in left ankle and joints of left foot  Localized edema  Stiffness of left ankle, not elsewhere classified  Difficulty in walking, not elsewhere classified     Problem List Patient Active Problem List   Diagnosis Date Noted  . Aortic valve regurgitation 06/24/2019  . Urinary incontinence 06/24/2019  . Parapelvic renal cyst 06/24/2019  . Chronic shoulder bursitis, left 06/24/2019  . Arthritis 06/24/2019  . Essential hypertension 03/18/2019  . Mid back pain 03/18/2019  . Muscle weakness (generalized) 03/18/2019  . Pleuritic chest pain 03/18/2019  . Menorrhagia 08/10/2018  . Vitamin D deficiency 12/17/2017  . Iron deficiency anemia 11/13/2017  . Alopecia 11/03/2017  . Seborrheic dermatitis of scalp 11/03/2017  . Cyst of ovary 11/03/2017  . Atypical squamous cells of undetermined significance on cytologic smear of cervix (ASC-US) 11/03/2017  . Right foot pain 11/03/2017  . Thrombocytosis ([facility]) 10/31/2017  . Leg edema, left 06/11/2013  . Petechiae 06/11/2013  . LGSIL (low grade squamous intraepithelial dysplasia)   . Reflux   . Hyperlipidemia 04/23/2007  . GAD (generalized anxiety disorder) 04/23/2007  . DEPRESSION 04/23/2007  . GERD 04/23/2007  . HYPERTENSION, GESTATIONAL 04/23/2007  . GESTATIONAL DIABETES 04/23/2007  . DE QUERVAIN'S TENOSYNOVITIS 04/23/2007    Sumner Boast., PT 11/30/2019, 5:47 PM  [place] [address] [place], [place], [address] Phone: [phone]   Fax:  [phone]  Name: [name] MRN: [id number] Date of Birth: May 21, 1971

## 2019-12-02 MED FILL — OMRON 3 SERIES BP MONITOR D: 30 days supply | Qty: 1 | Fill #0

## 2019-12-03 ENCOUNTER — Encounter (HOSPITAL_COMMUNITY): Payer: Self-pay | Admitting: Emergency Medicine

## 2019-12-03 ENCOUNTER — Emergency Department (HOSPITAL_COMMUNITY)
Admission: EM | Admit: 2019-12-03 | Discharge: 2019-12-03 | Disposition: A | Discharge status: Home / Self Care | Payer: No Typology Code available for payment source | Attending: Emergency Medicine | Admitting: Emergency Medicine

## 2019-12-03 ENCOUNTER — Other Ambulatory Visit: Payer: Self-pay

## 2019-12-03 ENCOUNTER — Emergency Department (HOSPITAL_BASED_OUTPATIENT_CLINIC_OR_DEPARTMENT_OTHER)
Admit: 2019-12-03 | Discharge: 2019-12-03 | Disposition: A | Discharge status: Home / Self Care | Payer: No Typology Code available for payment source

## 2019-12-03 DIAGNOSIS — R609 Edema, unspecified: Secondary | ICD-10-CM

## 2019-12-03 DIAGNOSIS — G8929 Other chronic pain: Secondary | ICD-10-CM | POA: Diagnosis not present

## 2019-12-03 DIAGNOSIS — Z8616 Personal history of COVID-19: Secondary | ICD-10-CM | POA: Insufficient documentation

## 2019-12-03 DIAGNOSIS — Z87891 Personal history of nicotine dependence: Secondary | ICD-10-CM | POA: Diagnosis not present

## 2019-12-03 DIAGNOSIS — I10 Essential (primary) hypertension: Secondary | ICD-10-CM | POA: Insufficient documentation

## 2019-12-03 DIAGNOSIS — Z79899 Other long term (current) drug therapy: Secondary | ICD-10-CM | POA: Insufficient documentation

## 2019-12-03 DIAGNOSIS — M25572 Pain in left ankle and joints of left foot: Secondary | ICD-10-CM | POA: Insufficient documentation

## 2019-12-03 DIAGNOSIS — M79605 Pain in left leg: Secondary | ICD-10-CM | POA: Diagnosis present

## 2019-12-03 HISTORY — DX: COVID-19: U07.1

## 2019-12-03 NOTE — ED Triage Notes (Signed)
Injured left lower leg in November, had MRI in March- but now wants to be checked for a blood clot-is having calf pain now

## 2019-12-03 NOTE — Discharge Instructions (Signed)
Follow-up with orthopedics or foot and ankle.

## 2019-12-03 NOTE — Progress Notes (Signed)
Left lower extremity venous duplex has been completed. Preliminary results can be found in CV Proc through chart review.  Results were given to [facility].  12/03/19 7:00 PM Carlos Levering RVT

## 2019-12-03 NOTE — ED Provider Notes (Signed)
[facility] DEPART[facility] Provider Note   CSN: [id number] Arrival date & time: 12/03/19  1750     History Chief Complaint  Patient presents with  . Leg Pain    [name] is a 49 y.o. female with medical history significant for anemia, anxiety, GERD, hypertension, hyperlipidemia who presents for evaluation of leg pain.  Patient had leg injury in November.  Has continued to have pain.  She is followed by Dr. Lucia Gaskins.  Has had MRIs and labs and they are not able to figure out the cause of her pain.  Patient seems frustrated as she continues to have pain at her ankle.  Today she presents for possible blood clot.  Yesterday had pain to her lateral distal calf.  No chest pain, shortness of breath.  Denies prior history of PEs or DVTs.  Denies new injury or trauma.  No headache, lightheadedness, dizziness, paresthesias, redness, swelling, warmth.  Denies aggravating or alleviating factors.  History obtained from patient and past medical record.  No interpreter is used.  HPI     Past Medical History:  Diagnosis Date  . Anemia   . Anxiety   . ASCUS favoring benign 12/2015   negative high risk HPV  . Chicken pox   . COVID-19 december  . GERD (gastroesophageal reflux disease)    during pregnancy   . 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 Hyperlipidemia   . Hypertension    during pregnancy   . 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 Reflux     Patient Active Problem List   Diagnosis Date Noted  . Aortic valve regurgitation 06/24/2019  . Urinary incontinence 06/24/2019  . Parapelvic renal cyst 06/24/2019  . Chronic shoulder bursitis, left 06/24/2019  . Arthritis 06/24/2019  . Essential hypertension 03/18/2019  . Mid back pain 03/18/2019  . Muscle weakness (generalized) 03/18/2019  . Pleuritic chest pain 03/18/2019  . Menorrhagia 08/10/2018  . Vitamin D deficiency 12/17/2017  . Iron deficiency anemia 11/13/2017  .  Alopecia 11/03/2017  . Seborrheic dermatitis of scalp 11/03/2017  . Cyst of ovary 11/03/2017  . Atypical squamous cells of undetermined significance on cytologic smear of cervix (ASC-US) 11/03/2017  . Right foot pain 11/03/2017  . Thrombocytosis ([place]) 10/31/2017  . Leg edema, left 06/11/2013  . Petechiae 06/11/2013  . LGSIL (low grade squamous intraepithelial dysplasia)   . Reflux   . Hyperlipidemia 04/23/2007  . GAD (generalized anxiety disorder) 04/23/2007  . DEPRESSION 04/23/2007  . GERD 04/23/2007  . HYPERTENSION, GESTATIONAL 04/23/2007  . GESTATIONAL DIABETES 04/23/2007  . DE QUERVAIN'S TENOSYNOVITIS 04/23/2007    Past Surgical History:  Procedure Laterality Date  . COLPOSCOPY    . LAPAROSCOPIC VAGINAL HYSTERECTOMY WITH SALPINGO OOPHORECTOMY Bilateral 08/10/2018   Procedure: LAPAROSCOPIC ASSISTED VAGINAL HYSTERECTOMY WITH BILATERAL SALPINGO OOPHORECTOMY;  Surgeon: [name], MD;  Location: [facility];  Service: Gynecology;  Laterality: Bilateral;  . TONSILLECTOMY AND ADENOIDECTOMY       OB History    Gravida  2   Para  2   Term  2   Preterm      AB      Living  2     SAB      TAB      Ectopic      Multiple      Live Births              Family History  Problem Relation Age of Onset  . Hypertension Mother   . Cancer Mother        LUNG CANCER/SMOKER  . Depression Mother   . Mental illness Mother   . Diabetes Father   . Hypertension Father   . Heart disease Father   . Hyperlipidemia Father   . Stroke Father   . Hypertension Brother   . Diabetes Brother   . Depression Brother   . Hyperlipidemia Brother   . Cancer Maternal Uncle        ESOPHAGEAL CA  . Diabetes Cousin   . Heart disease Cousin   . Cancer Maternal Grandfather        Brain  . Stroke Paternal Grandmother   . Depression Maternal Grandmother   . Mental retardation Maternal Grandmother   . Stroke Paternal Grandfather     Social History   Tobacco  Use  . Smoking status: Former Smoker    Types: Cigarettes    Quit date: 09/02/1990    Years since quitting: 29.2  . Smokeless tobacco: Never Used  . Tobacco comment: smoked socially in high school   Substance Use Topics  . Alcohol use: Not Currently    Alcohol/week: 0.0 standard drinks    Comment: occasionally   . Drug use: No    Home Medications Prior to Admission medications   Medication Sig Start Date End Date Taking? Authorizing Provider  albuterol (VENTOLIN HFA) 108 (90 Base) MCG/ACT inhaler Inhale 1-2 puffs into the lungs every 6 (six) hours as needed for wheezing or shortness of breath. Patient not taking: Reported on 10/29/2019 09/01/19   McLean-Scocuzza, Nino Glow, MD  carvedilol (COREG) 3.125 MG tablet Take 1 tablet (3.125 mg total) by mouth 2 (two) times daily with a meal. 03/18/19   McLean-Scocuzza, Nino Glow, MD  cyclobenzaprine (FLEXERIL) 5 MG tablet Take 1 tablet (5 mg total) by mouth at bedtime as needed for muscle spasms. Patient not taking: Reported on 10/29/2019 06/24/19   McLean-Scocuzza, Nino Glow, MD  meloxicam (MOBIC) 7.5 MG tablet Take 1 tablet (7.5 mg total) by mouth daily. 07/20/19   Tasia Catchings, Amy V, PA-C  rosuvastatin (CRESTOR) 20 MG tablet Take 1 tablet (20 mg total) by mouth daily. 07/07/19   Hilty, Nadean Corwin, MD  VITAMIN D PO Take 5,000 Units by mouth daily.     [provider]    Allergies    Codeine, Erythromycin, and Levofloxacin  Review of Systems   Review of Systems  Constitutional: Negative.   HENT: Negative.   Respiratory: Negative.   Cardiovascular: Negative.   Gastrointestinal: Negative.   Genitourinary: Negative.   Musculoskeletal:       Left ankle pain  Skin: Negative.   Neurological: Negative.   All other systems reviewed and are negative.   Physical Exam Updated Vital Signs BP (!) 172/85 (BP Location: Left Arm)   Pulse 65   Temp 97.7 F (36.5 C) (Oral)   Resp 14   Ht 5' 2.5" (1.588 m)   Wt 77.1 kg   LMP 08/02/2018 (Exact Date)    SpO2 97%   BMI 30.60 kg/m   Physical Exam Vitals and nursing note reviewed.  Constitutional:      General: She is not in acute distress.    Appearance: She is well-developed. She is not ill-appearing, toxic-appearing or diaphoretic.  HENT:     Head: Normocephalic and atraumatic.     Nose: Nose normal.     Mouth/Throat:     Mouth: Mucous membranes are  moist.     Pharynx: Oropharynx is clear.  Eyes:     Pupils: Pupils are equal, round, and reactive to light.  Cardiovascular:     Rate and Rhythm: Normal rate.     Pulses: Normal pulses.     Heart sounds: Normal heart sounds.  Pulmonary:     Effort: No respiratory distress.     Breath sounds: Normal breath sounds.  Abdominal:     General: Bowel sounds are normal. There is no distension.  Musculoskeletal:        General: No swelling, deformity or signs of injury. Normal range of motion.     Cervical back: Normal range of motion.     Right lower leg: No edema.     Left lower leg: No edema.     Right foot: Normal.     Left foot: Tenderness present.       Feet:     Comments: Compartments soft.  No edema, erythema or warmth.  Tenderness to her left lateral malleolus however no overlying skin changes.  Able to plantarflex and dorsiflex without difficulty.  Bevelyn Buckles' sign negative.  Skin:    General: Skin is warm and dry.     Capillary Refill: Capillary refill takes less than 2 seconds.     Comments: No edema, erythema or warmth  Neurological:     General: No focal deficit present.     Mental Status: She is alert.     Cranial Nerves: Cranial nerves are intact.     Sensory: Sensation is intact.     Motor: Motor function is intact.     Coordination: Coordination is intact.     Gait: Gait is intact.     Comments: Intact sensation.  Ambulatory but difficulty     ED Results / Procedures / Treatments   Labs (all labs ordered are listed, but only abnormal results are displayed) Labs Reviewed - No data to  display  EKG None  Radiology VAS [place] LOWER EXTREMITY VENOUS (DVT) (ONLY [facility] & [facility])  Result Date: 12/03/2019  Lower Venous DVTStudy Indications: Edema.  Risk Factors: None identified. Comparison Study: No prior studies. Performing Technologist: Oliver Hum RVT  Examination Guidelines: A complete evaluation includes B-mode imaging, spectral Doppler, color Doppler, and power Doppler as needed of all accessible portions of each vessel. Bilateral testing is considered an integral part of a complete examination. Limited examinations for reoccurring indications may be performed as noted. The reflux portion of the exam is performed with the patient in reverse Trendelenburg.  +---------+---------------+---------+-----------+----------+--------------+ RIGHT    CompressibilityPhasicitySpontaneityPropertiesThrombus Aging +---------+---------------+---------+-----------+----------+--------------+ CFV      Full           Yes      Yes                                 +---------+---------------+---------+-----------+----------+--------------+ SFJ      Full                                                        +---------+---------------+---------+-----------+----------+--------------+ FV Prox  Full                                                        +---------+---------------+---------+-----------+----------+--------------+  FV Mid   Full                                                        +---------+---------------+---------+-----------+----------+--------------+ FV DistalFull                                                        +---------+---------------+---------+-----------+----------+--------------+ PFV      Full                                                        +---------+---------------+---------+-----------+----------+--------------+ POP      Full           Yes      Yes                                  +---------+---------------+---------+-----------+----------+--------------+ PTV      Full                                                        +---------+---------------+---------+-----------+----------+--------------+ PERO     Full                                                        +---------+---------------+---------+-----------+----------+--------------+   +----+---------------+---------+-----------+----------+--------------+ LEFTCompressibilityPhasicitySpontaneityPropertiesThrombus Aging +----+---------------+---------+-----------+----------+--------------+ CFV Full           Yes      Yes                                 +----+---------------+---------+-----------+----------+--------------+     Summary: RIGHT: - There is no evidence of deep vein thrombosis in the lower extremity.  - No cystic structure found in the popliteal fossa.  LEFT: - No evidence of common femoral vein obstruction.  *See table(s) above for measurements and observations.    Preliminary     Procedures Procedures (including critical care time)  Medications Ordered in ED Medications - No data to display  ED Course  I have reviewed the triage vital signs and the nursing notes.  Pertinent labs & imaging results that were available during my care of the patient were reviewed by me and considered in my medical decision making (see chart for details).  49 year old female appears otherwise well presents for evaluation of ankle pain.  Had injury in November and has had chronic pain to this area.  Followed by Dr. Lucia Gaskins with orthopedics.  Has had labs and MRIs outpatient.  Per prior review seems like she had contusion and some edema to her marrow.  She has been doing PT  and OT.  Patient concerned that they could have missed a blood clot presents here for evaluation.  No chest pain, shortness of breath.  No tachycardia, tachypnea or hypoxia.  She has neuromusculoskeletal exam.  She is neurovascularly  intact.  Her pain seems more located to her lateral malleolus and her posterior calf.  Ultrasound here did for DVC.  Unfortunately seems like her pain is chronic in nature.  I do not think we need to repeat her imaging or labs at this time.  I have suggested she follow-up with orthopedics.  She is requesting recommendations for second opinion.  We will give her recommendations in her discharge.  She may return if she has any new worsening symptoms.  Low suspicion for septic joint, gout, hemarthrosis, acute fracture, dislocation, myositis, DVT, compartment syndrome, rhabdomyolysis.  The patient has been appropriately medically screened and/or stabilized in the ED. I have low suspicion for any other emergent medical condition which would require further screening, evaluation or treatment in the ED or require inpatient management.  Patient is hemodynamically stable and in no acute distress.  Patient able to ambulate in department prior to ED.  Evaluation does not show acute pathology that would require ongoing or additional emergent interventions while in the emergency department or further inpatient treatment.  I have discussed the diagnosis with the patient and answered all questions.  Pain is been managed while in the emergency department and patient has no further complaints prior to discharge.  Patient is comfortable with plan discussed in room and is stable for discharge at this time.  I have discussed strict return precautions for returning to the emergency department.  Patient was encouraged to follow-up with PCP/specialist refer to at discharge.    MDM Rules/Calculators/A&P                       Final Clinical Impression(s) / ED Diagnoses Final diagnoses:  Chronic pain of left ankle    Rx / DC Orders ED Discharge Orders    None       Azusena Erlandson A, PA-C 12/03/19 1927    Tegeler, Gwenyth Allegra, MD 12/03/19 2308

## 2019-12-07 ENCOUNTER — Other Ambulatory Visit: Payer: Self-pay

## 2019-12-07 ENCOUNTER — Ambulatory Visit: Payer: PRIVATE HEALTH INSURANCE | Attending: Orthopaedic Surgery | Admitting: Physical Therapy

## 2019-12-07 ENCOUNTER — Telehealth: Payer: Self-pay | Admitting: *Deleted

## 2019-12-07 ENCOUNTER — Encounter: Payer: Self-pay | Admitting: Physical Therapy

## 2019-12-07 DIAGNOSIS — M25552 Pain in left hip: Secondary | ICD-10-CM | POA: Insufficient documentation

## 2019-12-07 DIAGNOSIS — M25572 Pain in left ankle and joints of left foot: Secondary | ICD-10-CM | POA: Insufficient documentation

## 2019-12-07 DIAGNOSIS — M25672 Stiffness of left ankle, not elsewhere classified: Secondary | ICD-10-CM | POA: Diagnosis present

## 2019-12-07 DIAGNOSIS — R600 Localized edema: Secondary | ICD-10-CM | POA: Insufficient documentation

## 2019-12-07 DIAGNOSIS — R262 Difficulty in walking, not elsewhere classified: Secondary | ICD-10-CM | POA: Diagnosis present

## 2019-12-07 NOTE — Telephone Encounter (Signed)
[facility] went to [facility] to have labs drawn & there were no orders. Please place future lab orders.

## 2019-12-07 NOTE — Therapy (Signed)
[facility] [address] [place], [place], [address] Phone: [phone]   Fax:  [phone]  Physical Therapy Treatment  Patient Details  Name: [name] MRN: [id number] Date of Birth: 1971-05-28 Referring Provider (PT): Pershing Cox Date: 12/07/2019  PT End of Session - 12/07/19 1745    Visit Number  3    Number of Visits  12    Date for PT Re-Evaluation  12/27/19    Authorization Type  W/C    PT Start Time  1700    PT Stop Time  [id number]    PT Time Calculation (min)  46 min    Activity Tolerance  Patient limited by pain    Behavior During Therapy  Anxious;Agitated       Past Medical History:  Diagnosis Date  . Anemia   . Anxiety   . ASCUS favoring benign 12/2015   negative high risk HPV  . Chicken pox   . COVID-19 december  . GERD (gastroesophageal reflux disease)    during pregnancy   . 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 Hyperlipidemia   . Hypertension    during pregnancy   . 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 Reflux     Past Surgical History:  Procedure Laterality Date  . COLPOSCOPY    . LAPAROSCOPIC VAGINAL HYSTERECTOMY WITH SALPINGO OOPHORECTOMY Bilateral 08/10/2018   Procedure: LAPAROSCOPIC ASSISTED VAGINAL HYSTERECTOMY WITH BILATERAL SALPINGO OOPHORECTOMY;  Surgeon: [name], MD;  Location: [facility];  Service: Gynecology;  Laterality: Bilateral;  . TONSILLECTOMY AND ADENOIDECTOMY      There were no vitals filed for this visit.  Subjective Assessment - 12/07/19 1705    Subjective  Patient comes in with a regular shoe, reports that the boot was really bothering her, she has a mild limp, but is moving better than last week, she reports that she has swelling in the left lateral ankle, but I do not observe or feel any swellin in this area    Currently in Pain?  Yes    Pain Score  3     Pain Location  Ankle    Pain  Orientation  Left    Pain Descriptors / Indicators  Sore    Aggravating Factors   walking                       [facility] Adult PT Treatment/Exercise - 12/07/19 0001      Manual Therapy   Manual Therapy  Soft tissue mobilization    Manual therapy comments  gentle mid foot joint mobs, toe mobilizations    Soft tissue mobilization  STM to the calf and the anterior tibialis    Passive ROM  PROMleft ankle, passive stretch all the motions, passive , PF stretches      Ankle Exercises: Aerobic   Nustep  level 3 x 6 minutes      Ankle Exercises: Seated   Towel Inversion/Eversion  5 reps    Other Seated Ankle Exercises  on airex ankle mobility      Ankle Exercises: Supine   T-Band  yellow DF, eversion 3x10               PT Short Term Goals - 11/26/19 1153      PT SHORT TERM GOAL #1   Title  She will be independent with inital HEP.  Time  2    Period  Weeks    Status  New        PT Long Term Goals - 11/26/19 1153      PT LONG TERM GOAL #1   Title  She will be independent with all HEP issued as of last visit    Time  8    Period  Weeks    Status  New      PT LONG TERM GOAL #2   Title  decresae left ankle pain 50%    Time  8    Period  Weeks    Status  New      PT LONG TERM GOAL #3   Title  increase left ankle DF to 10 degrees    Time  8    Period  Weeks    Status  New      PT LONG TERM GOAL #4   Title  walk without difficulty    Time  8    Period  Weeks    Status  New      PT LONG TERM GOAL #5   Title  return to work with no restrictions    Time  8    Period  Weeks    Status  New            Plan - 12/07/19 1746    Clinical Impression Statement  Patient walking much better in her shoe, she does report staying off of her feet all weekend, she is still very tender in the calf, the anterior tib area, she is also tender plantar fascia.  She seems to have a lot of anxiety and has a lot of questions, which are good but I worry that she  is just worrying herself more and more and becoming anxious.    PT Next Visit Plan  see how the initiation of exercise did and try to advance as tolerated, keep up with the STM    Consulted and Agree with Plan of Care  Patient       Patient will benefit from skilled therapeutic intervention in order to improve the following deficits and impairments:  Abnormal gait, Improper body mechanics, Pain, Increased muscle spasms, Decreased mobility, Decreased activity tolerance, Decreased endurance, Decreased range of motion, Decreased strength, Impaired flexibility, Increased edema, Difficulty walking  Visit Diagnosis: Pain in left ankle and joints of left foot  Stiffness of left ankle, not elsewhere classified  Difficulty in walking, not elsewhere classified  Pain in left hip  Localized edema     Problem List Patient Active Problem List   Diagnosis Date Noted  . Aortic valve regurgitation 06/24/2019  . Urinary incontinence 06/24/2019  . Parapelvic renal cyst 06/24/2019  . Chronic shoulder bursitis, left 06/24/2019  . Arthritis 06/24/2019  . Essential hypertension 03/18/2019  . Mid back pain 03/18/2019  . Muscle weakness (generalized) 03/18/2019  . Pleuritic chest pain 03/18/2019  . Menorrhagia 08/10/2018  . Vitamin D deficiency 12/17/2017  . Iron deficiency anemia 11/13/2017  . Alopecia 11/03/2017  . Seborrheic dermatitis of scalp 11/03/2017  . Cyst of ovary 11/03/2017  . Atypical squamous cells of undetermined significance on cytologic smear of cervix (ASC-US) 11/03/2017  . Right foot pain 11/03/2017  . Thrombocytosis ([place]) 10/31/2017  . Leg edema, left 06/11/2013  . Petechiae 06/11/2013  . LGSIL (low grade squamous intraepithelial dysplasia)   . Reflux   . Hyperlipidemia 04/23/2007  . GAD (generalized anxiety disorder) 04/23/2007  . DEPRESSION 04/23/2007  .  GERD 04/23/2007  . HYPERTENSION, GESTATIONAL 04/23/2007  . GESTATIONAL DIABETES 04/23/2007  . DE QUERVAIN'S  TENOSYNOVITIS 04/23/2007    Sumner Boast., PT 12/07/2019, 5:53 PM  [address] [place], [place], [address] Phone: [phone]   Fax:  [phone]  Name: [name] MRN: [id number] Date of Birth: 1971-01-29

## 2019-12-08 ENCOUNTER — Telehealth: Payer: Self-pay | Admitting: Internal Medicine

## 2019-12-08 NOTE — Telephone Encounter (Signed)
Pt would like a referral Dr Earleen Newport for her foot and ankle as discussed.

## 2019-12-08 NOTE — Telephone Encounter (Signed)
Labs are in future, clinic collect for [place] pt must be fasting   [place]

## 2019-12-08 NOTE — Telephone Encounter (Signed)
Get eye records Dr. Verdell Face all in [place]

## 2019-12-08 NOTE — Telephone Encounter (Signed)
Request sent 

## 2019-12-08 NOTE — Telephone Encounter (Signed)
Patient informed and verbalized understanding.  She will go fasting in the morning.

## 2019-12-09 ENCOUNTER — Other Ambulatory Visit (INDEPENDENT_AMBULATORY_CARE_PROVIDER_SITE_OTHER): Payer: Self-pay

## 2019-12-09 ENCOUNTER — Ambulatory Visit: Payer: PRIVATE HEALTH INSURANCE | Attending: Orthopaedic Surgery | Admitting: Physical Therapy

## 2019-12-09 ENCOUNTER — Other Ambulatory Visit: Payer: Self-pay

## 2019-12-09 ENCOUNTER — Encounter: Payer: Self-pay | Admitting: Physical Therapy

## 2019-12-09 DIAGNOSIS — R262 Difficulty in walking, not elsewhere classified: Secondary | ICD-10-CM | POA: Insufficient documentation

## 2019-12-09 DIAGNOSIS — I10 Essential (primary) hypertension: Secondary | ICD-10-CM

## 2019-12-09 DIAGNOSIS — M25672 Stiffness of left ankle, not elsewhere classified: Secondary | ICD-10-CM | POA: Diagnosis present

## 2019-12-09 DIAGNOSIS — M25552 Pain in left hip: Secondary | ICD-10-CM | POA: Insufficient documentation

## 2019-12-09 DIAGNOSIS — E559 Vitamin D deficiency, unspecified: Secondary | ICD-10-CM

## 2019-12-09 DIAGNOSIS — E785 Hyperlipidemia, unspecified: Secondary | ICD-10-CM

## 2019-12-09 DIAGNOSIS — Z1329 Encounter for screening for other suspected endocrine disorder: Secondary | ICD-10-CM

## 2019-12-09 DIAGNOSIS — D508 Other iron deficiency anemias: Secondary | ICD-10-CM

## 2019-12-09 DIAGNOSIS — M25572 Pain in left ankle and joints of left foot: Secondary | ICD-10-CM | POA: Diagnosis present

## 2019-12-09 LAB — CBC WITH DIFFERENTIAL/PLATELET
Basophils Absolute: 0 10*3/uL (ref 0.0–0.1)
Basophils Relative: 0.5 % (ref 0.0–3.0)
Eosinophils Absolute: 0 10*3/uL (ref 0.0–0.7)
Eosinophils Relative: 0.9 % (ref 0.0–5.0)
HCT: 40.9 % (ref 36.0–46.0)
Hemoglobin: 14.2 g/dL (ref 12.0–15.0)
Lymphocytes Relative: 46.1 % — ABNORMAL HIGH (ref 12.0–46.0)
Lymphs Abs: 1.7 10*3/uL (ref 0.7–4.0)
MCHC: 34.7 g/dL (ref 30.0–36.0)
MCV: 89.4 fl (ref 78.0–100.0)
Monocytes Absolute: 0.3 10*3/uL (ref 0.1–1.0)
Monocytes Relative: 7.1 % (ref 3.0–12.0)
Neutro Abs: 1.7 10*3/uL (ref 1.4–7.7)
Neutrophils Relative %: 45.4 % (ref 43.0–77.0)
Platelets: 272 10*3/uL (ref 150.0–400.0)
RBC: 4.57 Mil/uL (ref 3.87–5.11)
RDW: 13.3 % (ref 11.5–15.5)
WBC: 3.8 10*3/uL — ABNORMAL LOW (ref 4.0–10.5)

## 2019-12-09 LAB — COMPREHENSIVE METABOLIC PANEL
ALT: 19 U/L (ref 0–35)
AST: 24 U/L (ref 0–37)
Albumin: 4.3 g/dL (ref 3.5–5.2)
Alkaline Phosphatase: 98 U/L (ref 39–117)
BUN: 19 mg/dL (ref 6–23)
CO2: 25 mEq/L (ref 19–32)
Calcium: 9.6 mg/dL (ref 8.4–10.5)
Chloride: 102 mEq/L (ref 96–112)
Creatinine, Ser: 0.69 mg/dL (ref 0.40–1.20)
GFR: 90.75 mL/min (ref >60.00)
Glucose, Bld: 98 mg/dL (ref 70–99)
Potassium: 3.8 mEq/L (ref 3.5–5.1)
Sodium: 137 mEq/L (ref 135–145)
Total Bilirubin: 1.5 mg/dL — ABNORMAL HIGH (ref 0.2–1.2)
Total Protein: 7.6 g/dL (ref 6.0–8.3)

## 2019-12-09 LAB — LIPID PANEL
Cholesterol: 155 mg/dL (ref 0–200)
HDL: 57.4 mg/dL (ref >39.00)
LDL Cholesterol: 80 mg/dL (ref 0–99)
NonHDL: 98.07
Total CHOL/HDL Ratio: 3
Triglycerides: 91 mg/dL (ref 0.0–149.0)
VLDL: 18.2 mg/dL (ref 0.0–40.0)

## 2019-12-09 LAB — TSH: TSH: 1.9 u[IU]/mL (ref 0.35–4.50)

## 2019-12-09 LAB — VITAMIN D 25 HYDROXY (VIT D DEFICIENCY, FRACTURES): VITD: 38.7 ng/mL (ref 30.00–100.00)

## 2019-12-09 NOTE — Therapy (Signed)
[place] [address] [place], [place], [address] Phone: [phone]   Fax:  [phone]  Physical Therapy Treatment  Patient Details  Name: MALARIE CRESAP MRN: [id number] Date of Birth: 1971-05-07 Referring Provider (PT): Pershing Cox Date: 12/09/2019  PT End of Session - 12/09/19 [id number]    Visit Number  4    Number of Visits  12    Date for PT Re-Evaluation  12/27/19    Authorization Type  W/C    PT Start Time  1703    PT Stop Time  1746    PT Time Calculation (min)  43 min    Activity Tolerance  Patient limited by pain    Behavior During Therapy  [facility] for tasks assessed/performed       Past Medical History:  Diagnosis Date  . Anemia   . Anxiety   . ASCUS favoring benign 12/2015   negative high risk HPV  . Chicken pox   . COVID-19 december  . GERD (gastroesophageal reflux disease)    during pregnancy   . 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 Hyperlipidemia   . Hypertension    during pregnancy   . 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 Reflux     Past Surgical History:  Procedure Laterality Date  . COLPOSCOPY    . LAPAROSCOPIC VAGINAL HYSTERECTOMY WITH SALPINGO OOPHORECTOMY Bilateral 08/10/2018   Procedure: LAPAROSCOPIC ASSISTED VAGINAL HYSTERECTOMY WITH BILATERAL SALPINGO OOPHORECTOMY;  Surgeon: [name], MD;  Location: [facility];  Service: Gynecology;  Laterality: Bilateral;  . TONSILLECTOMY AND ADENOIDECTOMY      There were no vitals filed for this visit.  Subjective Assessment - 12/09/19 1704    Subjective  I am walking better but I notice if I do too much I start hurting and limping again    Currently in Pain?  Yes    Pain Score  4     Pain Location  Ankle    Pain Orientation  Left                       [facility] Adult PT Treatment/Exercise - 12/09/19 0001      Electrical Stimulation   Electrical Stimulation  Location  Lt buttock , calf     Electrical Stimulation Action  IFC    Electrical Stimulation Parameters  supine    Electrical Stimulation Goals  Pain      Manual Therapy   Manual Therapy  Soft tissue mobilization    Manual therapy comments  gentle mid foot joint mobs, toe mobilizations    Soft tissue mobilization  STM to the calf and the anterior tibialis    Passive ROM  PROMleft ankle, passive stretch all the motions, passive , PF stretches      Ankle Exercises: Aerobic   Nustep  level 3 x 6 minutes      Ankle Exercises: Seated   ABC's  2 reps    Ankle Circles/Pumps  20 reps    Other Seated Ankle Exercises  on sit fit ankle mobility      Ankle Exercises: Supine   T-Band  yellow DF, eversion 3x10               PT Short Term Goals - 11/26/19 1153      PT SHORT TERM GOAL #1   Title  She will be  independent with inital HEP.     Time  2    Period  Weeks    Status  New        PT Long Term Goals - 12/09/19 1747      PT LONG TERM GOAL #1   Title  She will be independent with all HEP issued as of last visit    Status  On-going      PT LONG TERM GOAL #2   Title  decresae left ankle pain 50%    Status  On-going      PT LONG TERM GOAL #3   Title  increase left ankle DF to 10 degrees    Status  On-going            Plan - 12/09/19 1744    Clinical Impression Statement  Patient again walking better, when she hurries and gets fatigued she does start to limp again.  She is tolerating the exercises and the STM/PROM with pain.  She is hesitant when she has pain and needs reassurance to continue    PT Next Visit Plan  continue to increase function, ROM, gait    Consulted and Agree with Plan of Care  Patient       Patient will benefit from skilled therapeutic intervention in order to improve the following deficits and impairments:  Abnormal gait, Improper body mechanics, Pain, Increased muscle spasms, Decreased mobility, Decreased activity tolerance, Decreased  endurance, Decreased range of motion, Decreased strength, Impaired flexibility, Increased edema, Difficulty walking  Visit Diagnosis: Pain in left ankle and joints of left foot  Stiffness of left ankle, not elsewhere classified  Difficulty in walking, not elsewhere classified  Pain in left hip     Problem List Patient Active Problem List   Diagnosis Date Noted  . Chronic pain of right ankle 10/29/2019  . Left leg pain 10/29/2019  . Aortic valve regurgitation 06/24/2019  . Urinary incontinence 06/24/2019  . Parapelvic renal cyst 06/24/2019  . Chronic shoulder bursitis, left 06/24/2019  . Arthritis 06/24/2019  . Essential hypertension 03/18/2019  . Mid back pain 03/18/2019  . Muscle weakness (generalized) 03/18/2019  . Pleuritic chest pain 03/18/2019  . Menorrhagia 08/10/2018  . Vitamin D deficiency 12/17/2017  . Iron deficiency anemia 11/13/2017  . Alopecia 11/03/2017  . Seborrheic dermatitis of scalp 11/03/2017  . Cyst of ovary 11/03/2017  . Atypical squamous cells of undetermined significance on cytologic smear of cervix (ASC-US) 11/03/2017  . Right foot pain 11/03/2017  . Thrombocytosis ([place]) 10/31/2017  . Leg edema, left 06/11/2013  . Petechiae 06/11/2013  . LGSIL (low grade squamous intraepithelial dysplasia)   . Reflux   . Hyperlipidemia 04/23/2007  . GAD (generalized anxiety disorder) 04/23/2007  . DEPRESSION 04/23/2007  . GERD 04/23/2007  . GESTATIONAL DIABETES 04/23/2007  . DE QUERVAIN'S TENOSYNOVITIS 04/23/2007    Sumner Boast., PT 12/09/2019, 5:48 PM  [address] [place], [place], [address] Phone: [phone]   Fax:  [phone]  Name: [name] MRN: [id number] Date of Birth: 03-14-1971

## 2019-12-10 ENCOUNTER — Encounter: Payer: Self-pay | Admitting: Internal Medicine

## 2019-12-10 DIAGNOSIS — H1010 Acute atopic conjunctivitis, unspecified eye: Secondary | ICD-10-CM | POA: Insufficient documentation

## 2019-12-10 LAB — IRON,TIBC AND FERRITIN PANEL
%SAT: 24 % (calc) (ref 16–45)
Ferritin: 65 ng/mL (ref 16–232)
Iron: 101 ug/dL (ref 40–190)
TIBC: 416 mcg/dL (calc) (ref 250–450)

## 2019-12-13 ENCOUNTER — Encounter: Payer: Self-pay | Admitting: Physical Therapy

## 2019-12-13 ENCOUNTER — Other Ambulatory Visit: Payer: Self-pay

## 2019-12-13 ENCOUNTER — Ambulatory Visit: Payer: PRIVATE HEALTH INSURANCE | Attending: Orthopaedic Surgery | Admitting: Physical Therapy

## 2019-12-13 DIAGNOSIS — M25572 Pain in left ankle and joints of left foot: Secondary | ICD-10-CM | POA: Insufficient documentation

## 2019-12-13 DIAGNOSIS — R600 Localized edema: Secondary | ICD-10-CM | POA: Diagnosis present

## 2019-12-13 DIAGNOSIS — M25672 Stiffness of left ankle, not elsewhere classified: Secondary | ICD-10-CM | POA: Insufficient documentation

## 2019-12-13 DIAGNOSIS — R262 Difficulty in walking, not elsewhere classified: Secondary | ICD-10-CM | POA: Diagnosis present

## 2019-12-13 DIAGNOSIS — M25552 Pain in left hip: Secondary | ICD-10-CM | POA: Insufficient documentation

## 2019-12-13 NOTE — Telephone Encounter (Signed)
Spoke with Caryl Pina at [facility]. She states she will re-send this patient records. Confirmed our fax number of [phone] with her.

## 2019-12-13 NOTE — Therapy (Signed)
[place], [place], [address] Phone: [phone]   Fax:  [phone]  Physical Therapy Treatment  Patient Details  Name: [name] MRN: [id number] Date of Birth: [birth year] Referring Provider (PT): Pershing Cox Date: 12/13/2019  PT End of Session - 12/13/19 [id number]    Visit Number  5    Number of Visits  12    Date for PT Re-Evaluation  12/27/19    Authorization Type  W/C    PT Start Time  [id number]    PT Stop Time  1337    PT Time Calculation (min)  56 min    Activity Tolerance  Patient limited by pain    Behavior During Therapy  [facility] for tasks assessed/performed       Past Medical History:  Diagnosis Date  . Anemia   . Anxiety   . ASCUS favoring benign 12/2015   negative high risk HPV  . Chicken pox   . COVID-19 december  . GERD (gastroesophageal reflux disease)    during pregnancy   . 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 Hyperlipidemia   . Hypertension    during pregnancy   . 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 Reflux     Past Surgical History:  Procedure Laterality Date  . COLPOSCOPY    . LAPAROSCOPIC VAGINAL HYSTERECTOMY WITH SALPINGO OOPHORECTOMY Bilateral 08/10/2018   Procedure: LAPAROSCOPIC ASSISTED VAGINAL HYSTERECTOMY WITH BILATERAL SALPINGO OOPHORECTOMY;  Surgeon: [name], MD;  Location: [facility];  Service: Gynecology;  Laterality: Bilateral;  . TONSILLECTOMY AND ADENOIDECTOMY      There were no vitals filed for this visit.  Subjective Assessment - 12/13/19 1243    Subjective  Pt feels like the manual work is helping to loosen up her lower leg, most of soreness is in the arch of the foot    Patient Stated Goals  walk better, have less pain,    Currently in Pain?  Yes    Pain Score  2     Pain Location  Foot    Pain Orientation  Left    Pain Descriptors / Indicators  Aching;Dull    Aggravating  Factors   standing and walking         [facility] PT Assessment - 12/13/19 0001      Assessment   Medical Diagnosis  left ankle and foot pain, difficulty walking, bone contusions                   [facility] Adult PT Treatment/Exercise - 12/13/19 0001      Self-Care   Self-Care  Other Self-Care Comments    Other Self-Care Comments   self trigger point release using foam roller to Lt gastroc and hamstrings, anterior tib       Modalities   Modalities  Moist Heat;Electrical Stimulation      Moist Heat Therapy   Number Minutes Moist Heat  15 Minutes    Moist Heat Location  --   Lt hamstring and calf     Electrical Stimulation   Electrical Stimulation Location  Lt hamstring , calf     Electrical Stimulation Action  premod    Electrical Stimulation Parameters  supine     Electrical Stimulation Goals  Pain      Manual Therapy   Manual Therapy  --   manual work while on heat  ans stim   Manual therapy comments  gentle mid foot joint mobs, toe mobilizations, hypomobile 4th and 5th metatarsal     Passive ROM  PROMleft ankle, passive stretch all the motions, passive , PF stretches      Ankle Exercises: Aerobic   Stationary Bike  L0x6'      Ankle Exercises: Stretches   Gastroc Stretch  2 reps;30 seconds   Lt leg, not pushing into pain     Ankle Exercises: Seated   BAPS  15 reps;Sitting;Level 3   clock work 6/12, 3/9, CW/CCW cirlces, pain at 6 to 3 CCW              PT Short Term Goals - 11/26/19 1153      PT SHORT TERM GOAL #1   Title  She will be independent with inital HEP.     Time  2    Period  Weeks    Status  New        PT Long Term Goals - 12/09/19 1747      PT LONG TERM GOAL #1   Title  She will be independent with all HEP issued as of last visit    Status  On-going      PT LONG TERM GOAL #2   Title  decresae left ankle pain 50%    Status  On-going      PT LONG TERM GOAL #3   Title  increase left ankle DF to 10 degrees    Status  On-going             Plan - 12/13/19 1353    Clinical Impression Statement  [name] did well with instruction in self trigger point release using foam roller, repaorted decreased pain after in all muscles rolled. Ankle and foot PROM improved with repetition of motions, is hypomoible in her tarsals and calcaneous, difficult to assess navicular d/t pain. She is very concerned about her dx and limitations and how she will heal.    Rehab Potential  Fair    PT Frequency  2x / week    PT Duration  6 weeks    PT Treatment/Interventions  ADLs/Self Care Home Management;Cryotherapy;Electrical Stimulation;Moist Heat;Ultrasound;DME Instruction;Therapeutic activities;Therapeutic exercise;Balance training;Neuromuscular re-education;Manual techniques;Patient/family education;Vasopneumatic Device;Iontophoresis 4mg /ml Dexamethasone;Gait training;Functional mobility training;Stair training;Dry needling    PT Next Visit Plan  continue to increase function, ROM, gait    Consulted and Agree with Plan of Care  Patient       Patient will benefit from skilled therapeutic intervention in order to improve the following deficits and impairments:  Abnormal gait, Improper body mechanics, Pain, Increased muscle spasms, Decreased mobility, Decreased activity tolerance, Decreased endurance, Decreased range of motion, Decreased strength, Impaired flexibility, Increased edema, Difficulty walking  Visit Diagnosis: Pain in left ankle and joints of left foot  Stiffness of left ankle, not elsewhere classified  Difficulty in walking, not elsewhere classified  Pain in left hip  Localized edema     Problem List Patient Active Problem List   Diagnosis Date Noted  . Allergic conjunctivitis 12/10/2019  . Chronic pain of right ankle 10/29/2019  . Left leg pain 10/29/2019  . Aortic valve regurgitation 06/24/2019  . Urinary incontinence 06/24/2019  . Parapelvic renal cyst 06/24/2019  . Chronic shoulder bursitis, left 06/24/2019  .  Arthritis 06/24/2019  . Essential hypertension 03/18/2019  . Mid back pain 03/18/2019  . Muscle weakness (generalized) 03/18/2019  . Pleuritic chest pain 03/18/2019  . Menorrhagia 08/10/2018  . Vitamin D deficiency  12/17/2017  . Iron deficiency anemia 11/13/2017  . Alopecia 11/03/2017  . Seborrheic dermatitis of scalp 11/03/2017  . Cyst of ovary 11/03/2017  . Atypical squamous cells of undetermined significance on cytologic smear of cervix (ASC-US) 11/03/2017  . Right foot pain 11/03/2017  . Thrombocytosis ([place]) 10/31/2017  . Leg edema, left 06/11/2013  . Petechiae 06/11/2013  . LGSIL (low grade squamous intraepithelial dysplasia)   . Reflux   . Hyperlipidemia 04/23/2007  . GAD (generalized anxiety disorder) 04/23/2007  . DEPRESSION 04/23/2007  . GERD 04/23/2007  . GESTATIONAL DIABETES 04/23/2007  . DE QUERVAIN'S TENOSYNOVITIS 04/23/2007    Boneta Lucks rPT  12/13/2019, 1:56 PM  [address] [place], [place], [address] Phone: [phone]   Fax:  [phone]  Name: [name] MRN: [id number] Date of Birth: [birth year]

## 2019-12-13 NOTE — Telephone Encounter (Signed)
Referral placed Dr. Jacqualyn Posey [facility] in [place]

## 2019-12-13 NOTE — Addendum Note (Signed)
Addended by: Orland Mustard on: 12/13/2019 10:33 AM   Modules accepted: Orders

## 2019-12-15 ENCOUNTER — Encounter: Payer: Self-pay | Admitting: Physical Therapy

## 2019-12-15 ENCOUNTER — Ambulatory Visit: Payer: PRIVATE HEALTH INSURANCE | Attending: Orthopaedic Surgery | Admitting: Physical Therapy

## 2019-12-15 ENCOUNTER — Other Ambulatory Visit: Payer: Self-pay

## 2019-12-15 DIAGNOSIS — M25672 Stiffness of left ankle, not elsewhere classified: Secondary | ICD-10-CM | POA: Insufficient documentation

## 2019-12-15 DIAGNOSIS — R600 Localized edema: Secondary | ICD-10-CM | POA: Insufficient documentation

## 2019-12-15 DIAGNOSIS — M25572 Pain in left ankle and joints of left foot: Secondary | ICD-10-CM | POA: Diagnosis present

## 2019-12-15 DIAGNOSIS — R262 Difficulty in walking, not elsewhere classified: Secondary | ICD-10-CM | POA: Diagnosis present

## 2019-12-15 DIAGNOSIS — M25552 Pain in left hip: Secondary | ICD-10-CM | POA: Insufficient documentation

## 2019-12-15 NOTE — Therapy (Signed)
[place], [place], [address] Phone: [phone]   Fax:  [phone]  Physical Therapy Treatment  Patient Details  Name: [name] MRN: [id number] Date of Birth: Oct 04, 1970 Referring Provider (PT): Pershing Cox Date: 12/15/2019  PT End of Session - 12/15/19 1237    Visit Number  6    Number of Visits  12    Date for PT Re-Evaluation  12/27/19    Authorization Type  W/C    PT Start Time  1147    PT Stop Time  1250    PT Time Calculation (min)  63 min    Activity Tolerance  Patient limited by pain    Behavior During Therapy  [facility] for tasks assessed/performed       Past Medical History:  Diagnosis Date  . Anemia   . Anxiety   . ASCUS favoring benign 12/2015   negative high risk HPV  . Chicken pox   . COVID-19 december  . GERD (gastroesophageal reflux disease)    during pregnancy   . 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 Hyperlipidemia   . Hypertension    during pregnancy   . 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 Reflux     Past Surgical History:  Procedure Laterality Date  . COLPOSCOPY    . LAPAROSCOPIC VAGINAL HYSTERECTOMY WITH SALPINGO OOPHORECTOMY Bilateral 08/10/2018   Procedure: LAPAROSCOPIC ASSISTED VAGINAL HYSTERECTOMY WITH BILATERAL SALPINGO OOPHORECTOMY;  Surgeon: [name], MD;  Location: [facility];  Service: Gynecology;  Laterality: Bilateral;  . TONSILLECTOMY AND ADENOIDECTOMY      There were no vitals filed for this visit.  Subjective Assessment - 12/15/19 1153    Subjective  Pt reports she is doing pretty good, feels like the massage on her foot is helping - loosening things up    Patient Stated Goals  walk better, have less pain,         [facility] PT Assessment - 12/15/19 0001      AROM   Left Ankle Dorsiflexion  5    Left Ankle Inversion  16    Left Ankle Eversion  18      PROM   Left  Ankle Dorsiflexion  12                   [facility] Adult PT Treatment/Exercise - 12/15/19 0001      Self-Care   Other Self-Care Comments   foam rolling to Lt gastroc, peroneals and hamstrings      Modalities   Modalities  Moist Heat;Electrical Stimulation      Moist Heat Therapy   Number Minutes Moist Heat  15 Minutes    Moist Heat Location  --   Lt hamstirng and calf     Electrical Stimulation   Electrical Stimulation Location  Lt hamstring , calf     Electrical Stimulation Action  premod    Electrical Stimulation Parameters  supine to tolerance    Electrical Stimulation Goals  Pain      Manual Therapy   Soft tissue mobilization  gentle manual work to [employer] and peroneals.       Ankle Exercises: Aerobic   Stationary Bike  L0x5'      Ankle Exercises: Stretches   Gastroc Stretch  2 reps;30 seconds    Other Stretch  inversion ankle stretch 3x20 sec  Ankle Exercises: Seated   BAPS  15 reps;Sitting;Level 3    Other Seated Ankle Exercises  2x15 bilat eversion with red band     Other Seated Ankle Exercises  ankle DF seated with FWD scoots       Ankle Exercises: Standing   BAPS  Standing   attempted with UE assist - unable d/t pain in foot              PT Short Term Goals - 11/26/19 1153      PT SHORT TERM GOAL #1   Title  She will be independent with inital HEP.     Time  2    Period  Weeks    Status  New        PT Long Term Goals - 12/09/19 1747      PT LONG TERM GOAL #1   Title  She will be independent with all HEP issued as of last visit    Status  On-going      PT LONG TERM GOAL #2   Title  decresae left ankle pain 50%    Status  On-going      PT LONG TERM GOAL #3   Title  increase left ankle DF to 10 degrees    Status  On-going            Plan - 12/15/19 1456    Clinical Impression Statement  [name] was very hesitant to perform any exercise in standing, reporting and focusing on the pain she was having in the Lt lower leg  so exercise was transitioned back to sitting.  She did perform some resisted work in this position.  She was strongly encouraged to purchase foam roller and home TEN to help decrease her muscular tightness and pain.  She was given a handout with the information.  Her ankle ROM has improved since last measured, approaching functional motion.  There are still palpable trigger points and banding in the gastroc.    Rehab Potential  Fair    PT Frequency  2x / week    PT Duration  6 weeks    PT Treatment/Interventions  ADLs/Self Care Home Management;Cryotherapy;Electrical Stimulation;Moist Heat;Ultrasound;DME Instruction;Therapeutic activities;Therapeutic exercise;Balance training;Neuromuscular re-education;Manual techniques;Patient/family education;Vasopneumatic Device;Iontophoresis 4mg /ml Dexamethasone;Gait training;Functional mobility training;Stair training;Dry needling    PT Next Visit Plan  continue to increase function, ROM, gait    Consulted and Agree with Plan of Care  Patient       Patient will benefit from skilled therapeutic intervention in order to improve the following deficits and impairments:  Abnormal gait, Improper body mechanics, Pain, Increased muscle spasms, Decreased mobility, Decreased activity tolerance, Decreased endurance, Decreased range of motion, Decreased strength, Impaired flexibility, Increased edema, Difficulty walking  Visit Diagnosis: Pain in left ankle and joints of left foot  Stiffness of left ankle, not elsewhere classified  Difficulty in walking, not elsewhere classified  Pain in left hip  Localized edema     Problem List Patient Active Problem List   Diagnosis Date Noted  . Allergic conjunctivitis 12/10/2019  . Chronic pain of right ankle 10/29/2019  . Left leg pain 10/29/2019  . Aortic valve regurgitation 06/24/2019  . Urinary incontinence 06/24/2019  . Parapelvic renal cyst 06/24/2019  . Chronic shoulder bursitis, left 06/24/2019  . Arthritis  06/24/2019  . Essential hypertension 03/18/2019  . Mid back pain 03/18/2019  . Muscle weakness (generalized) 03/18/2019  . Pleuritic chest pain 03/18/2019  . Menorrhagia 08/10/2018  . Vitamin D deficiency 12/17/2017  .  Iron deficiency anemia 11/13/2017  . Alopecia 11/03/2017  . Seborrheic dermatitis of scalp 11/03/2017  . Cyst of ovary 11/03/2017  . Atypical squamous cells of undetermined significance on cytologic smear of cervix (ASC-US) 11/03/2017  . Right foot pain 11/03/2017  . Thrombocytosis ([place]) 10/31/2017  . Leg edema, left 06/11/2013  . Petechiae 06/11/2013  . LGSIL (low grade squamous intraepithelial dysplasia)   . Reflux   . Hyperlipidemia 04/23/2007  . GAD (generalized anxiety disorder) 04/23/2007  . DEPRESSION 04/23/2007  . GERD 04/23/2007  . GESTATIONAL DIABETES 04/23/2007  . DE QUERVAIN'S TENOSYNOVITIS 04/23/2007    Jeral Pinch PT  12/15/2019, 2:59 PM  [address] [place], [place], [address] Phone: [phone]   Fax:  [phone]  Name: ANYHA TONN MRN: [id number] Date of Birth: 1970-09-20

## 2019-12-17 ENCOUNTER — Telehealth: Payer: Self-pay | Admitting: Internal Medicine

## 2019-12-17 NOTE — Telephone Encounter (Signed)
Please advise 

## 2019-12-17 NOTE — Telephone Encounter (Signed)
Patient called to let Dr. Olivia Mackie know that she needs a referral to [facility] [place]. Oliver Pila is the doctor she will be seeing. She needs a referral to a podiatrice and the name Dr. Celesta Gentile at [facility] [address] in [place]. She needs these referral because she has [employer]. Appts are scheduled by patient referrals only.

## 2019-12-20 ENCOUNTER — Other Ambulatory Visit: Payer: Self-pay

## 2019-12-20 ENCOUNTER — Ambulatory Visit: Payer: PRIVATE HEALTH INSURANCE | Attending: Orthopaedic Surgery | Admitting: Physical Therapy

## 2019-12-20 ENCOUNTER — Encounter: Payer: Self-pay | Admitting: Physical Therapy

## 2019-12-20 DIAGNOSIS — M25572 Pain in left ankle and joints of left foot: Secondary | ICD-10-CM | POA: Insufficient documentation

## 2019-12-20 DIAGNOSIS — M25552 Pain in left hip: Secondary | ICD-10-CM | POA: Diagnosis present

## 2019-12-20 DIAGNOSIS — M25672 Stiffness of left ankle, not elsewhere classified: Secondary | ICD-10-CM | POA: Insufficient documentation

## 2019-12-20 DIAGNOSIS — R262 Difficulty in walking, not elsewhere classified: Secondary | ICD-10-CM | POA: Insufficient documentation

## 2019-12-20 NOTE — Addendum Note (Signed)
Addended by: Orland Mustard on: 12/20/2019 09:14 PM   Modules accepted: Orders

## 2019-12-20 NOTE — Therapy (Signed)
[address] [place] [place], [place], [address] Phone: [phone]   Fax:  [phone]  Physical Therapy Treatment  Patient Details  Name: [name] MRN: [id number] Date of Birth: 08/30/1971 Referring Provider (PT): [name] Date: 12/20/2019  PT End of Session - 12/20/19 [id number]    Visit Number  7    Number of Visits  12    Date for PT Re-Evaluation  12/27/19    Authorization Type  W/C    PT Start Time  1532    PT Stop Time  1630    PT Time Calculation (min)  58 min    Activity Tolerance  Patient limited by pain    Behavior During Therapy  [facility] for tasks assessed/performed       Past Medical History:  Diagnosis Date  . Anemia   . Anxiety   . ASCUS favoring benign 12/2015   negative high risk HPV  . Chicken pox   . COVID-19 december  . GERD (gastroesophageal reflux disease)    during pregnancy   . 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 Hyperlipidemia   . Hypertension    during pregnancy   . 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 Reflux     Past Surgical History:  Procedure Laterality Date  . COLPOSCOPY    . LAPAROSCOPIC VAGINAL HYSTERECTOMY WITH SALPINGO OOPHORECTOMY Bilateral 08/10/2018   Procedure: LAPAROSCOPIC ASSISTED VAGINAL HYSTERECTOMY WITH BILATERAL SALPINGO OOPHORECTOMY;  Surgeon: [name], MD;  Location: [facility];  Service: Gynecology;  Laterality: Bilateral;  . TONSILLECTOMY AND ADENOIDECTOMY      There were no vitals filed for this visit.  Subjective Assessment - 12/20/19 1546    Subjective  Patient reports that she is stiff from sitting all day    Currently in Pain?  Yes    Pain Score  2     Pain Location  Foot    Pain Orientation  Left    Aggravating Factors   walking                       [facility] Adult PT Treatment/Exercise - 12/20/19 0001      Ambulation/Gait   Gait Comments  had her  walk around the building with me, she had more of a limp going down the slope, was pretty good on the flat area, she did fatigue and the limp was more pronounced at the end.  no device, and wearing a normal shoe      Electrical Stimulation   Electrical Stimulation Location  Lt hamstring , calf     Electrical Stimulation Action  premod    Electrical Stimulation Parameters  supine with leg elevated    Electrical Stimulation Goals  Pain      Manual Therapy   Manual Therapy  Joint mobilization;Soft tissue mobilization;Passive ROM    Joint Mobilization  to the mid foot and toes    Soft tissue mobilization  gentle manual work to [employer] and peroneals.     Passive ROM  PROMleft ankle, passive stretch all the motions, passive , PF stretches      Ankle Exercises: Seated   BAPS  15 reps;Sitting;Level 3    Other Seated Ankle Exercises  2x15 bilat eversion with red band     Other Seated Ankle Exercises  ankle DF seated with FWD scoots  Ankle Exercises: Supine   T-Band  red eversion and DF 2x15 each               PT Short Term Goals - 11/26/19 1153      PT SHORT TERM GOAL #1   Title  She will be independent with inital HEP.     Time  2    Period  Weeks    Status  New        PT Long Term Goals - 12/09/19 1747      PT LONG TERM GOAL #1   Title  She will be independent with all HEP issued as of last visit    Status  On-going      PT LONG TERM GOAL #2   Title  decresae left ankle pain 50%    Status  On-going      PT LONG TERM GOAL #3   Title  increase left ankle DF to 10 degrees    Status  On-going            Plan - 12/20/19 1654    Clinical Impression Statement  Pateint was able to walk around the building outside, on slopes and uneven terrain, she was fearful of the uneven terrain, did well on the slope, worse going down hill, a little bit more af an antalgicgait, still worries about any weight bearing exercises, the tenderenss in the calf is subsiding she  still is tender in the foot    PT Next Visit Plan  continue to increase function, ROM, gait    Consulted and Agree with Plan of Care  Patient       Patient will benefit from skilled therapeutic intervention in order to improve the following deficits and impairments:  Abnormal gait, Improper body mechanics, Pain, Increased muscle spasms, Decreased mobility, Decreased activity tolerance, Decreased endurance, Decreased range of motion, Decreased strength, Impaired flexibility, Increased edema, Difficulty walking  Visit Diagnosis: Pain in left ankle and joints of left foot  Stiffness of left ankle, not elsewhere classified  Difficulty in walking, not elsewhere classified  Pain in left hip     Problem List Patient Active Problem List   Diagnosis Date Noted  . Allergic conjunctivitis 12/10/2019  . Chronic pain of right ankle 10/29/2019  . Left leg pain 10/29/2019  . Aortic valve regurgitation 06/24/2019  . Urinary incontinence 06/24/2019  . Parapelvic renal cyst 06/24/2019  . Chronic shoulder bursitis, left 06/24/2019  . Arthritis 06/24/2019  . Essential hypertension 03/18/2019  . Mid back pain 03/18/2019  . Muscle weakness (generalized) 03/18/2019  . Pleuritic chest pain 03/18/2019  . Menorrhagia 08/10/2018  . Vitamin D deficiency 12/17/2017  . Iron deficiency anemia 11/13/2017  . Alopecia 11/03/2017  . Seborrheic dermatitis of scalp 11/03/2017  . Cyst of ovary 11/03/2017  . Atypical squamous cells of undetermined significance on cytologic smear of cervix (ASC-US) 11/03/2017  . Right foot pain 11/03/2017  . Thrombocytosis ([place]) 10/31/2017  . Leg edema, left 06/11/2013  . Petechiae 06/11/2013  . LGSIL (low grade squamous intraepithelial dysplasia)   . Reflux   . Hyperlipidemia 04/23/2007  . GAD (generalized anxiety disorder) 04/23/2007  . DEPRESSION 04/23/2007  . GERD 04/23/2007  . GESTATIONAL DIABETES 04/23/2007  . DE QUERVAIN'S TENOSYNOVITIS 04/23/2007     Sumner Boast., PT 12/20/2019, 5:17 PM  [address] [place], [place], [address] Phone: [phone]   Fax:  [phone]  Name: [name] MRN:  [id number] Date of Birth: 1971-01-20

## 2019-12-20 NOTE — Telephone Encounter (Signed)
Referrals in  [place] foot MD referral placed 12/13/19 and  Dermatology 12/20/19    [name] I read over eye MD noted and they rec referral to allergy due to eye allergies  -can pt figure out who is in network for allergy with her insurance and let me know if she wants referral to allergy and where she wants to go?

## 2019-12-21 ENCOUNTER — Telehealth: Payer: Self-pay | Admitting: Internal Medicine

## 2019-12-21 ENCOUNTER — Encounter: Payer: Self-pay | Admitting: Internal Medicine

## 2019-12-21 ENCOUNTER — Ambulatory Visit (INDEPENDENT_AMBULATORY_CARE_PROVIDER_SITE_OTHER): Payer: No Typology Code available for payment source | Admitting: Internal Medicine

## 2019-12-21 VITALS — BP 138/88 | HR 61 | Temp 97.6°F | Ht 62.5 in | Wt 173.4 lb

## 2019-12-21 DIAGNOSIS — D72819 Decreased white blood cell count, unspecified: Secondary | ICD-10-CM | POA: Diagnosis not present

## 2019-12-21 DIAGNOSIS — R739 Hyperglycemia, unspecified: Secondary | ICD-10-CM

## 2019-12-21 DIAGNOSIS — I10 Essential (primary) hypertension: Secondary | ICD-10-CM | POA: Diagnosis not present

## 2019-12-21 DIAGNOSIS — M25572 Pain in left ankle and joints of left foot: Secondary | ICD-10-CM

## 2019-12-21 DIAGNOSIS — R0683 Snoring: Secondary | ICD-10-CM

## 2019-12-21 DIAGNOSIS — R079 Chest pain, unspecified: Secondary | ICD-10-CM

## 2019-12-21 DIAGNOSIS — Z113 Encounter for screening for infections with a predominantly sexual mode of transmission: Secondary | ICD-10-CM

## 2019-12-21 DIAGNOSIS — H9202 Otalgia, left ear: Secondary | ICD-10-CM

## 2019-12-21 DIAGNOSIS — R5383 Other fatigue: Secondary | ICD-10-CM | POA: Diagnosis not present

## 2019-12-21 DIAGNOSIS — R17 Unspecified jaundice: Secondary | ICD-10-CM

## 2019-12-21 DIAGNOSIS — G8929 Other chronic pain: Secondary | ICD-10-CM | POA: Insufficient documentation

## 2019-12-21 DIAGNOSIS — R300 Dysuria: Secondary | ICD-10-CM

## 2019-12-21 MED ORDER — NEOMYCIN-POLYMYXIN-HC 3.5-10000-1 OT SOLN: 4.0000 [drp] | Freq: Three times a day (TID) | OTIC | 0 refills | Status: DC

## 2019-12-21 MED ORDER — HYDROCHLOROTHIAZIDE 12.5 MG PO TABS: 6.2500 mg | ORAL_TABLET | Freq: Every day | ORAL | 3 refills | Status: DC

## 2019-12-21 NOTE — Telephone Encounter (Signed)
Patient informed and verbalized understanding.  Verified with pt what pharmacy these were sent to. [name]

## 2019-12-21 NOTE — Telephone Encounter (Signed)
Pt seen in office today and discussed during appointment

## 2019-12-21 NOTE — Telephone Encounter (Signed)
Pt seen in office today and forgot to mention mild swelling in the lower left side of the neck. States she was having some pain in the left side of her neck last week and now there his a small circular area on the lower left neck that is swollen. No discoloration, heat, or tenderness to the area. Are is a bit sore if you mash on it.   Pt has also been having left ear pain and was given ear drops by Dr Olivia Mackie. Informed Dr Olivia Mackie and she states it could be a swollen node from what is going on with the ear. She would like for the patient to try the ear drops and let her know if this worsens or continues.

## 2019-12-21 NOTE — Patient Instructions (Addendum)
Please let me know who is covered as far as allergy we will place referral   Refer  pulmonary in [facility]  No more than 1500 mg salt/sodium daily   Debrox ear wax drops   Hypertension, Adult High blood pressure (hypertension) is when the force of blood pumping through the arteries is too strong. The arteries are the blood vessels that carry blood from the heart throughout the body. Hypertension forces the heart to work harder to pump blood and may cause arteries to become narrow or stiff. Untreated or uncontrolled hypertension can cause a heart attack, heart failure, a stroke, kidney disease, and other problems. A blood pressure reading consists of a higher number over a lower number. Ideally, your blood pressure should be below 120/80. The first ("top") number is called the systolic pressure. It is a measure of the pressure in your arteries as your heart beats. The second ("bottom") number is called the diastolic pressure. It is a measure of the pressure in your arteries as the heart relaxes. What are the causes? The exact cause of this condition is not known. There are some conditions that result in or are related to high blood pressure. What increases the risk? Some risk factors for high blood pressure are under your control. The following factors may make you more likely to develop this condition:  Smoking.  Having type 2 diabetes mellitus, high cholesterol, or both.  Not getting enough exercise or physical activity.  Being overweight.  Having too much fat, sugar, calories, or salt (sodium) in your diet.  Drinking too much alcohol. Some risk factors for high blood pressure may be difficult or impossible to change. Some of these factors include:  Having chronic kidney disease.  Having a family history of high blood pressure.  Age. Risk increases with age.  Race. You may be at higher risk if you are African American.  Gender. Men are at higher risk than women before  age 86. After age 43, women are at higher risk than men.  Having obstructive sleep apnea.  Stress. What are the signs or symptoms? High blood pressure may not cause symptoms. Very high blood pressure (hypertensive crisis) may cause:  Headache.  Anxiety.  Shortness of breath.  Nosebleed.  Nausea and vomiting.  Vision changes.  Severe chest pain.  Seizures. How is this diagnosed? This condition is diagnosed by measuring your blood pressure while you are seated, with your arm resting on a flat surface, your legs uncrossed, and your feet flat on the floor. The cuff of the blood pressure monitor will be placed directly against the skin of your upper arm at the level of your heart. It should be measured at least twice using the same arm. Certain conditions can cause a difference in blood pressure between your right and left arms. Certain factors can cause blood pressure readings to be lower or higher than normal for a short period of time:  When your blood pressure is higher when you are in a health care provider's office than when you are at home, this is called white coat hypertension. Most people with this condition do not need medicines.  When your blood pressure is higher at home than when you are in a health care provider's office, this is called masked hypertension. Most people with this condition may need medicines to control blood pressure. If you have a high blood pressure reading during one visit or you have normal blood pressure with other risk factors, you may be asked  to:  Return on a different day to have your blood pressure checked again.  Monitor your blood pressure at home for 1 week or longer. If you are diagnosed with hypertension, you may have other blood or imaging tests to help your health care provider understand your overall risk for other conditions. How is this treated? This condition is treated by making healthy lifestyle changes, such as eating healthy  foods, exercising more, and reducing your alcohol intake. Your health care provider may prescribe medicine if lifestyle changes are not enough to get your blood pressure under control, and if:  Your systolic blood pressure is above 130.  Your diastolic blood pressure is above 80. Your personal target blood pressure may vary depending on your medical conditions, your age, and other factors. Follow these instructions at home: Eating and drinking   Eat a diet that is high in fiber and potassium, and low in sodium, added sugar, and fat. An example eating plan is called the DASH (Dietary Approaches to Stop Hypertension) diet. To eat this way: ? Eat plenty of fresh fruits and vegetables. Try to fill one half of your plate at each meal with fruits and vegetables. ? Eat whole grains, such as whole-wheat pasta, brown rice, or whole-grain bread. Fill about one fourth of your plate with whole grains. ? Eat or drink low-fat dairy products, such as skim milk or low-fat yogurt. ? Avoid fatty cuts of meat, processed or cured meats, and poultry with skin. Fill about one fourth of your plate with lean proteins, such as fish, chicken without skin, beans, eggs, or tofu. ? Avoid pre-made and processed foods. These tend to be higher in sodium, added sugar, and fat.  Reduce your daily sodium intake. Most people with hypertension should eat less than 1,500 mg of sodium a day.  Do not drink alcohol if: ? Your health care provider tells you not to drink. ? You are pregnant, may be pregnant, or are planning to become pregnant.  If you drink alcohol: ? Limit how much you use to:  0-1 drink a day for women.  0-2 drinks a day for men. ? Be aware of how much alcohol is in your drink. In the [place] one drink equals one 12 oz bottle of beer (355 mL), one 5 oz glass of wine (148 mL), or one 1 oz glass of hard liquor (44 mL). Lifestyle   Work with your health care provider to maintain a healthy body weight or to lose  weight. Ask what an ideal weight is for you.  Get at least 30 minutes of exercise most days of the week. Activities may include walking, swimming, or biking.  Include exercise to strengthen your muscles (resistance exercise), such as Pilates or lifting weights, as part of your weekly exercise routine. Try to do these types of exercises for 30 minutes at least 3 days a week.  Do not use any products that contain nicotine or tobacco, such as cigarettes, e-cigarettes, and chewing tobacco. If you need help quitting, ask your health care provider.  Monitor your blood pressure at home as told by your health care provider.  Keep all follow-up visits as told by your health care provider. This is important. Medicines  Take over-the-counter and prescription medicines only as told by your health care provider. Follow directions carefully. Blood pressure medicines must be taken as prescribed.  Do not skip doses of blood pressure medicine. Doing this puts you at risk for problems and can make the medicine  less effective.  Ask your health care provider about side effects or reactions to medicines that you should watch for. Contact a health care provider if you:  Think you are having a reaction to a medicine you are taking.  Have headaches that keep coming back (recurring).  Feel dizzy.  Have swelling in your ankles.  Have trouble with your vision. Get help right away if you:  Develop a severe headache or confusion.  Have unusual weakness or numbness.  Feel faint.  Have severe pain in your chest or abdomen.  Vomit repeatedly.  Have trouble breathing. Summary  Hypertension is when the force of blood pumping through your arteries is too strong. If this condition is not controlled, it may put you at risk for serious complications.  Your personal target blood pressure may vary depending on your medical conditions, your age, and other factors. For most people, a normal blood pressure is  less than 120/80.  Hypertension is treated with lifestyle changes, medicines, or a combination of both. Lifestyle changes include losing weight, eating a healthy, low-sodium diet, exercising more, and limiting alcohol. This information is not intended to replace advice given to you by your health care provider. Make sure you discuss any questions you have with your health care provider. Document Revised: 04/29/2018 Document Reviewed: 04/29/2018 Elsevier Patient Education  [place] DASH stands for "Dietary Approaches to Stop Hypertension." The DASH eating plan is a healthy eating plan that has been shown to reduce high blood pressure (hypertension). It may also reduce your risk for type 2 diabetes, heart disease, and stroke. The DASH eating plan may also help with weight loss. What are tips for following this plan?  General guidelines  Avoid eating more than 2,300 mg (milligrams) of salt (sodium) a day. If you have hypertension, you may need to reduce your sodium intake to 1,500 mg a day.  Limit alcohol intake to no more than 1 drink a day for nonpregnant women and 2 drinks a day for men. One drink equals 12 oz of beer, 5 oz of wine, or 1 oz of hard liquor.  Work with your health care provider to maintain a healthy body weight or to lose weight. Ask what an ideal weight is for you.  Get at least 30 minutes of exercise that causes your heart to beat faster (aerobic exercise) most days of the week. Activities may include walking, swimming, or biking.  Work with your health care provider or diet and nutrition specialist (dietitian) to adjust your eating plan to your individual calorie needs. Reading food labels   Check food labels for the amount of sodium per serving. Choose foods with less than 5 percent of the Daily Value of sodium. Generally, foods with less than 300 mg of sodium per serving fit into this eating plan.  To find whole grains, look for the word  "whole" as the first word in the ingredient list. Shopping  Buy products labeled as "low-sodium" or "no salt added."  Buy fresh foods. Avoid canned foods and premade or frozen meals. Cooking  Avoid adding salt when cooking. Use salt-free seasonings or herbs instead of table salt or sea salt. Check with your health care provider or pharmacist before using salt substitutes.  Do not fry foods. Cook foods using healthy methods such as baking, boiling, grilling, and broiling instead.  Cook with heart-healthy oils, such as olive, canola, soybean, or sunflower oil. Meal planning  Eat a balanced diet that  includes: ? 5 or more servings of fruits and vegetables each day. At each meal, try to fill half of your plate with fruits and vegetables. ? Up to 6-8 servings of whole grains each day. ? Less than 6 oz of lean meat, poultry, or fish each day. A 3-oz serving of meat is about the same size as a deck of cards. One egg equals 1 oz. ? 2 servings of low-fat dairy each day. ? A serving of nuts, seeds, or beans 5 times each week. ? Heart-healthy fats. Healthy fats called Omega-3 fatty acids are found in foods such as flaxseeds and coldwater fish, like sardines, salmon, and mackerel.  Limit how much you eat of the following: ? Canned or prepackaged foods. ? Food that is high in trans fat, such as fried foods. ? Food that is high in saturated fat, such as fatty meat. ? Sweets, desserts, sugary drinks, and other foods with added sugar. ? Full-fat dairy products.  Do not salt foods before eating.  Try to eat at least 2 vegetarian meals each week.  Eat more home-cooked food and less restaurant, buffet, and fast food.  When eating at a restaurant, ask that your food be prepared with less salt or no salt, if possible. What foods are recommended? The items listed may not be a complete list. Talk with your dietitian about what dietary choices are best for you. Grains Whole-grain or whole-wheat  bread. Whole-grain or whole-wheat pasta. Brown rice. [place]. Bulgur. Whole-grain and low-sodium cereals. Pita bread. Low-fat, low-sodium crackers. Whole-wheat flour tortillas. Vegetables Fresh or frozen vegetables (raw, steamed, roasted, or grilled). Low-sodium or reduced-sodium tomato and vegetable juice. Low-sodium or reduced-sodium tomato sauce and tomato paste. Low-sodium or reduced-sodium canned vegetables. Fruits All fresh, dried, or frozen fruit. Canned fruit in natural juice (without added sugar). Meat and other protein foods Skinless chicken or [place]. Ground chicken or [place]. Pork with fat trimmed off. Fish and seafood. Egg whites. Dried beans, peas, or lentils. Unsalted nuts, nut butters, and seeds. Unsalted canned beans. Lean cuts of beef with fat trimmed off. Low-sodium, lean deli meat. Dairy Low-fat (1%) or fat-free (skim) milk. Fat-free, low-fat, or reduced-fat cheeses. Nonfat, low-sodium ricotta or cottage cheese. Low-fat or nonfat yogurt. Low-fat, low-sodium cheese. Fats and oils Soft margarine without trans fats. Vegetable oil. Low-fat, reduced-fat, or light mayonnaise and salad dressings (reduced-sodium). Canola, safflower, olive, soybean, and sunflower oils. Avocado. Seasoning and other foods Herbs. Spices. Seasoning mixes without salt. Unsalted popcorn and pretzels. Fat-free sweets. What foods are not recommended? The items listed may not be a complete list. Talk with your dietitian about what dietary choices are best for you. Grains Baked goods made with fat, such as croissants, muffins, or some breads. Dry pasta or rice meal packs. Vegetables Creamed or fried vegetables. Vegetables in a cheese sauce. Regular canned vegetables (not low-sodium or reduced-sodium). Regular canned tomato sauce and paste (not low-sodium or reduced-sodium). Regular tomato and vegetable juice (not low-sodium or reduced-sodium). Angie Fava. Olives. Fruits Canned fruit in a light or heavy  syrup. Fried fruit. Fruit in cream or butter sauce. Meat and other protein foods Fatty cuts of meat. Ribs. Fried meat. Berniece Salines. Sausage. Bologna and other processed lunch meats. Salami. Fatback. Hotdogs. Bratwurst. Salted nuts and seeds. Canned beans with added salt. Canned or smoked fish. Whole eggs or egg yolks. Chicken or [place] with skin. Dairy Whole or 2% milk, cream, and half-and-half. Whole or full-fat cream cheese. Whole-fat or sweetened yogurt. Full-fat cheese. Nondairy creamers. Whipped  toppings. Processed cheese and cheese spreads. Fats and oils Butter. Stick margarine. Lard. Shortening. Ghee. Bacon fat. Tropical oils, such as coconut, palm kernel, or palm oil. Seasoning and other foods Salted popcorn and pretzels. Onion salt, garlic salt, seasoned salt, table salt, and sea salt. Worcestershire sauce. Tartar sauce. Barbecue sauce. Teriyaki sauce. Soy sauce, including reduced-sodium. Steak sauce. Canned and packaged gravies. Fish sauce. Oyster sauce. Cocktail sauce. Horseradish that you find on the shelf. Ketchup. Mustard. Meat flavorings and tenderizers. Bouillon cubes. Hot sauce and Tabasco sauce. Premade or packaged marinades. Premade or packaged taco seasonings. Relishes. Regular salad dressings. Where to find more information:  National Heart, Lung, and [place]: https://wilson-eaton.com/  American Heart Association: www.heart.org Summary  The DASH eating plan is a healthy eating plan that has been shown to reduce high blood pressure (hypertension). It may also reduce your risk for type 2 diabetes, heart disease, and stroke.  With the DASH eating plan, you should limit salt (sodium) intake to 2,300 mg a day. If you have hypertension, you may need to reduce your sodium intake to 1,500 mg a day.  When on the DASH eating plan, aim to eat more fresh fruits and vegetables, whole grains, lean proteins, low-fat dairy, and heart-healthy fats.  Work with your health care provider or diet and  nutrition specialist (dietitian) to adjust your eating plan to your individual calorie needs. This information is not intended to replace advice given to you by your health care provider. Make sure you discuss any questions you have with your health care provider. Document Revised: 08/01/2017 Document Reviewed: 08/12/2016 Elsevier Patient Education  2020 [employer].

## 2019-12-21 NOTE — Progress Notes (Signed)
Chief Complaint  Patient presents with  . Follow-up  . Hypertension   F/u  1. HTN on coreg 3.125 bid and misses doses at times 12/03/19 ED visit BP was 172/85  2. C/o left ear pain nothing tried  3. H/o gestational DM, FH DM wants A1C checked  4. Elevated total bili wants to know cause will repeat with other labs I.e cbc for leukopenia  5. Chronic left ankle pain pending appt with Dr. Jacqualyn Posey 01/03/20  6. Fatigue c/o snoring at night will refer pulm for sleep study 7. C/o snoring and fatigue  8. C/o chest pain will CC cardiology 1 episode and try to get her worked in the cards    Review of Systems  Constitutional: Positive for malaise/fatigue. Negative for weight loss.  HENT: Negative for hearing loss.        Left ear pain   Eyes: Negative for blurred vision.  Respiratory: Negative for shortness of breath.   Cardiovascular: Negative for chest pain.  Gastrointestinal: Negative for abdominal pain.  Musculoskeletal: Positive for falls and joint pain.       Left ankle pain 2/10  Skin: Negative for rash.  Neurological: Positive for headaches.  Psychiatric/Behavioral: The patient is nervous/anxious.    Past Medical History:  Diagnosis Date  . Anemia   . Anxiety   . ASCUS favoring benign 12/2015   negative high risk HPV  . Chicken pox   . COVID-19 december  . GERD (gastroesophageal reflux disease)    during pregnancy   . 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 Hyperlipidemia   . Hypertension    during pregnancy   . 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 Reflux    Past Surgical History:  Procedure Laterality Date  . COLPOSCOPY    . LAPAROSCOPIC VAGINAL HYSTERECTOMY WITH SALPINGO OOPHORECTOMY Bilateral 08/10/2018   Procedure: LAPAROSCOPIC ASSISTED VAGINAL HYSTERECTOMY WITH BILATERAL SALPINGO OOPHORECTOMY;  Surgeon: [name], MD;  Location: [facility];  Service: Gynecology;  Laterality: Bilateral;  . TONSILLECTOMY AND  ADENOIDECTOMY     Family History  Problem Relation Age of Onset  . Hypertension Mother   . Cancer Mother        LUNG CANCER/SMOKER  . Depression Mother   . Mental illness Mother   . Diabetes Father   . Hypertension Father   . Heart disease Father   . Hyperlipidemia Father   . Stroke Father   . Hypertension Brother   . Diabetes Brother   . Depression Brother   . Hyperlipidemia Brother   . Cancer Maternal Uncle        ESOPHAGEAL CA  . Diabetes Cousin   . Heart disease Cousin   . Cancer Maternal Grandfather        Brain  . Stroke Paternal Grandmother   . Depression Maternal Grandmother   . Mental retardation Maternal Grandmother   . Stroke Paternal Grandfather    Social History   Socioeconomic History  . Marital status: Divorced    Spouse name: Not on file  . Number of children: 2  . Years of education: Not on file  . Highest education level: Not on file  Occupational History  . Not on file  Tobacco Use  . Smoking status: Former Smoker    Types: Cigarettes    Quit date: 09/02/1990    Years since quitting: 29.3  . Smokeless tobacco: Never Used  . Tobacco comment: smoked socially in  high school   Substance and Sexual Activity  . Alcohol use: Not Currently    Alcohol/week: 0.0 standard drinks    Comment: occasionally   . Drug use: No  . Sexual activity: Not Currently    Birth control/protection: Surgical    Comment: 1st intercourse 49 yo-More than 5 partners-Vasectomy  Other Topics Concern  . 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 Difficulty of Paying Living Expenses:   Food Insecurity:   . Worried About Charity fundraiser in the Last Year:   . Arboriculturist in the Last Year:   Transportation Needs:   . Film/video editor (Medical):   Marland Kitchen Lack of  Transportation (Non-Medical):   Physical Activity:   . Days of Exercise per Week:   . Minutes of Exercise per Session:   Stress:   . Feeling of Stress :   Social Connections:   . Frequency of Communication with Friends and Family:   . Frequency of Social Gatherings with Friends and Family:   . Attends Religious Services:   . Active Member of Clubs or Organizations:   . Attends Archivist Meetings:   Marland Kitchen Marital Status:   Intimate Partner Violence:   . Fear of Current or Ex-Partner:   . Emotionally Abused:   Marland Kitchen Physically Abused:   . Sexually Abused:    Current Meds  Medication Sig  . carvedilol (COREG) 3.125 MG tablet Take 1 tablet (3.125 mg total) by mouth 2 (two) times daily with a meal.  . meloxicam (MOBIC) 7.5 MG tablet Take 1 tablet (7.5 mg total) by mouth daily.  . rosuvastatin (CRESTOR) 20 MG tablet Take 1 tablet (20 mg total) by mouth daily.  Marland Kitchen VITAMIN D PO Take 5,000 Units by mouth daily.    Allergies  Allergen Reactions  . Codeine Shortness Of Breath    Breathing problems  - liquid med   . Erythromycin     GI upset   . Levofloxacin     Heart racing    Recent Results (from the past 2160 hour(s))  Iron, TIBC and Ferritin Panel     Status: None   Collection Time: 12/09/19  9:31 AM  Result Value Ref Range   Iron 101 40 - 190 mcg/dL   TIBC 416 250 - 450 mcg/dL (calc)   %SAT 24 16 - 45 % (calc)   Ferritin 65 16 - 232 ng/mL  VITAMIN D 25 Hydroxy (Vit-D Deficiency, Fractures)     Status: None   Collection Time: 12/09/19  9:31 AM  Result Value Ref Range   VITD 38.70 30.00 - 100.00 ng/mL  TSH     Status: None   Collection Time: 12/09/19  9:31 AM  Result Value Ref Range   TSH 1.90 0.35 - 4.50 uIU/mL  CBC with Differential/Platelet     Status: Abnormal   Collection Time: 12/09/19  9:31 AM  Result Value Ref Range   WBC 3.8 (L) 4.0 - 10.5 K/uL   RBC 4.57 3.87 - 5.11 Mil/uL   Hemoglobin 14.2 12.0 - 15.0 g/dL   HCT 40.9 36.0 - 46.0 %   MCV 89.4 78.0 - 100.0  fl   MCHC 34.7  30.0 - 36.0 g/dL   RDW 13.3 11.5 - 15.5 %   Platelets 272.0 150.0 - 400.0 K/uL   Neutrophils Relative % 45.4 43.0 - 77.0 %   Lymphocytes Relative 46.1 (H) 12.0 - 46.0 %   Monocytes Relative 7.1 3.0 - 12.0 %   Eosinophils Relative 0.9 0.0 - 5.0 %   Basophils Relative 0.5 0.0 - 3.0 %   Neutro Abs 1.7 1.4 - 7.7 K/uL   Lymphs Abs 1.7 0.7 - 4.0 K/uL   Monocytes Absolute 0.3 0.1 - 1.0 K/uL   Eosinophils Absolute 0.0 0.0 - 0.7 K/uL   Basophils Absolute 0.0 0.0 - 0.1 K/uL  Lipid panel     Status: None   Collection Time: 12/09/19  9:31 AM  Result Value Ref Range   Cholesterol 155 0 - 200 mg/dL    Comment: ATP III Classification       Desirable:  < 200 mg/dL               Borderline High:  200 - 239 mg/dL          High:  > = 240 mg/dL   Triglycerides 91.0 0.0 - 149.0 mg/dL    Comment: Normal:  <150 mg/dLBorderline High:  150 - 199 mg/dL   HDL 57.40 >39.00 mg/dL   VLDL 18.2 0.0 - 40.0 mg/dL   LDL Cholesterol 80 0 - 99 mg/dL   Total CHOL/HDL Ratio 3     Comment:                Men          Women1/2 Average Risk     3.4          3.3Average Risk          5.0          4.42X Average Risk          9.6          7.13X Average Risk          15.0          11.0                       NonHDL 98.07     Comment: NOTE:  Non-HDL goal should be 30 mg/dL higher than patient's LDL goal (i.e. LDL goal of < 70 mg/dL, would have non-HDL goal of < 100 mg/dL)  Comprehensive metabolic panel     Status: Abnormal   Collection Time: 12/09/19  9:31 AM  Result Value Ref Range   Sodium 137 135 - 145 mEq/L   Potassium 3.8 3.5 - 5.1 mEq/L   Chloride 102 96 - 112 mEq/L   CO2 25 19 - 32 mEq/L   Glucose, Bld 98 70 - 99 mg/dL   BUN 19 6 - 23 mg/dL   Creatinine, Ser 0.69 0.40 - 1.20 mg/dL   Total Bilirubin 1.5 (H) 0.2 - 1.2 mg/dL   Alkaline Phosphatase 98 39 - 117 U/L   AST 24 0 - 37 U/L   ALT 19 0 - 35 U/L   Total Protein 7.6 6.0 - 8.3 g/dL   Albumin 4.3 3.5 - 5.2 g/dL   GFR 90.75 >60.00 mL/min   Calcium  9.6 8.4 - 10.5 mg/dL   Objective  Body mass index is 31.21 kg/m. Wt Readings from Last 3 Encounters:  12/21/19 173 lb 6.4 oz (78.7 kg)  12/03/19 170 lb (77.1 kg)  10/29/19 175 lb (79.4  kg)   Temp Readings from Last 3 Encounters:  12/21/19 97.6 F (36.4 C) (Temporal)  12/03/19 97.7 F (36.5 C) (Oral)  07/20/19 98.3 F (36.8 C) (Oral)   BP Readings from Last 3 Encounters:  12/21/19 138/88  12/03/19 (!) 172/85  07/20/19 134/86   Pulse Readings from Last 3 Encounters:  12/21/19 61  12/03/19 65  07/20/19 79    Physical Exam Vitals and nursing note reviewed.  Constitutional:      Appearance: Normal appearance. She is well-developed and well-groomed. She is obese.  HENT:     Head: Normocephalic and atraumatic.  Eyes:     Conjunctiva/sclera: Conjunctivae normal.     Pupils: Pupils are equal, round, and reactive to light.  Cardiovascular:     Rate and Rhythm: Normal rate and regular rhythm.     Heart sounds: Normal heart sounds. No murmur.  Pulmonary:     Effort: Pulmonary effort is normal.     Breath sounds: Normal breath sounds.  Skin:    General: Skin is warm and dry.  Neurological:     General: No focal deficit present.     Mental Status: She is alert and oriented to person, place, and time. Mental status is at baseline.     Gait: Gait normal.  Psychiatric:        Attention and Perception: Attention and perception normal.        Mood and Affect: Mood and affect normal.        Speech: Speech normal.        Behavior: Behavior normal. Behavior is cooperative.        Thought Content: Thought content normal.        Cognition and Memory: Cognition and memory normal.        Judgment: Judgment normal.     Assessment  Plan  Leukopenia, unspecified type - Plan: CBC with Differential/Platelet repeat 01/08/20  Fatigue, unspecified type - Plan: Cortisol With repeat CBC B12 and vitamin D normal  Essential hypertension - Plan: Cortisol, hydrochlorothiazide (HYDRODIURIL)  6.25 MG tablet  With coreg 3.125 bid stressed compliance   Hyperglycemia - Plan: Hemoglobin A1c  Elevated bilirubin - Plan: Hepatic function panel, Bilirubin, direct [place] 04/2019 normal   Left ear pain - Plan: neomycin-polymyxin-hydrocortisone (CORTISPORIN) OTIC solution Wax in ears rec deborx ear wax drops  Snoring - Plan: Ambulatory referral to Pulmonology  Chest pain, unspecified type CC Dr Debara Pickett to get appt  Chronic pain of left ankle  F/u Dr. Jacqualyn Posey   HM Flu shot had10/21/20 Tdap per pt had 02/2016 need to confirm atf/u Consider covid 19 vaccine still undecided   DEXA 03/09/18 normal Pap 02/13/18 negative s/p hysterectomy 08/2018 fibroids and benign right ovarian cyst  -h/o abnormal pap LMP 10/19/17 Dr. 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 Delma Officer office in [place] she is to call back when finds who will take her as pt [facility]   Will get records eye MD [place] Dr.  Melissa Noon per pt seeing allergies/inflammation given steroid eye drops for pressure in left eye and thick film in eyes  -she will message me who is covered as far as allergist   Provider: Dr. Olivia Mackie McLean-Scocuzza-Internal Medicine

## 2019-12-22 ENCOUNTER — Other Ambulatory Visit: Payer: Self-pay

## 2019-12-22 ENCOUNTER — Ambulatory Visit: Payer: PRIVATE HEALTH INSURANCE | Attending: Orthopaedic Surgery | Admitting: Physical Therapy

## 2019-12-22 ENCOUNTER — Encounter: Payer: Self-pay | Admitting: Physical Therapy

## 2019-12-22 VITALS — BP 135/90

## 2019-12-22 DIAGNOSIS — R600 Localized edema: Secondary | ICD-10-CM

## 2019-12-22 DIAGNOSIS — M25572 Pain in left ankle and joints of left foot: Secondary | ICD-10-CM | POA: Diagnosis present

## 2019-12-22 DIAGNOSIS — M25672 Stiffness of left ankle, not elsewhere classified: Secondary | ICD-10-CM | POA: Diagnosis present

## 2019-12-22 DIAGNOSIS — R262 Difficulty in walking, not elsewhere classified: Secondary | ICD-10-CM

## 2019-12-22 NOTE — Therapy (Signed)
[facility] [address] [place], [place], [address] Phone: [phone]   Fax:  [phone]  Physical Therapy Treatment  Patient Details  Name: [name] MRN: [id number] Date of Birth: November 18, 1970 Referring Provider (PT): Pershing Cox Date: 12/22/2019  PT End of Session - 12/22/19 1635    Visit Number  8    Number of Visits  12    Date for PT Re-Evaluation  12/27/19    Authorization Type  W/C    PT Start Time  1533    PT Stop Time  1618    PT Time Calculation (min)  45 min    Activity Tolerance  Patient limited by pain    Behavior During Therapy  [facility] for tasks assessed/performed       Past Medical History:  Diagnosis Date  . Anemia   . Anxiety   . ASCUS favoring benign 12/2015   negative high risk HPV  . Chicken pox   . COVID-19 december  . GERD (gastroesophageal reflux disease)    during pregnancy   . 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 Hyperlipidemia   . Hypertension    during pregnancy   . 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 Reflux     Past Surgical History:  Procedure Laterality Date  . COLPOSCOPY    . LAPAROSCOPIC VAGINAL HYSTERECTOMY WITH SALPINGO OOPHORECTOMY Bilateral 08/10/2018   Procedure: LAPAROSCOPIC ASSISTED VAGINAL HYSTERECTOMY WITH BILATERAL SALPINGO OOPHORECTOMY;  Surgeon: [name], MD;  Location: [facility];  Service: Gynecology;  Laterality: Bilateral;  . TONSILLECTOMY AND ADENOIDECTOMY      Vitals:   12/22/19 1535  BP: 135/90    Subjective Assessment - 12/22/19 1535    Subjective  Pt reports that she feels the muscles in her leg are getting stronger but she is still having pain. Pt reports that she has a headache today and has been experiencing some high BP which is being monitored by her doctor.    Currently in Pain?  Yes    Pain Score  3     Pain Location  Foot    Pain Orientation  Left                        [facility] Adult PT Treatment/Exercise - 12/22/19 0001      Exercises   Exercises  Knee/Hip      Knee/Hip Exercises: Standing   Functional Squat  1 set;5 reps    Functional Squat Limitations  10 lb box; raised to 6" step d/t pt complaint of L ankle pain      Manual Therapy   Manual Therapy  Joint mobilization;Soft tissue mobilization;Passive ROM    Joint Mobilization  to the mid foot and toes    Soft tissue mobilization  gentle manual work to [employer] and peroneals.     Passive ROM  PROMleft ankle, passive stretch all the motions, passive , PF stretches      Ankle Exercises: Seated   Ankle Circles/Pumps  Left;20 reps      Ankle Exercises: Stretches   Gastroc Stretch  3 reps;30 seconds    Other Stretch  inversion ankle stretch 3x20 sec      Ankle Exercises: Standing   Other Standing Ankle Exercises  weighted carry with 10# box down hall and back  PT Short Term Goals - 11/26/19 1153      PT SHORT TERM GOAL #1   Title  She will be independent with inital HEP.     Time  2    Period  Weeks    Status  New        PT Long Term Goals - 12/09/19 1747      PT LONG TERM GOAL #1   Title  She will be independent with all HEP issued as of last visit    Status  On-going      PT LONG TERM GOAL #2   Title  decresae left ankle pain 50%    Status  On-going      PT LONG TERM GOAL #3   Title  increase left ankle DF to 10 degrees    Status  On-going            Plan - 12/22/19 1635    Clinical Impression Statement  Pt presented to clinic with reports of headache and high BP; took vitals and pt BP was 135/90. Pt was able to complete weighted carry with box with reports of mild increases in L ankle pain. Pt struggled with squatting to pick up weighted box; reported increase in L foot pain. Raised box 6" off ground and pt completed with less pain. Pt is still reluctant to complete weight bearing exercises d/t pain. Pt still very  tender in L peroneals, cuboid region, and plantar surface of foot.    PT Treatment/Interventions  ADLs/Self Care Home Management;Cryotherapy;Electrical Stimulation;Moist Heat;Ultrasound;DME Instruction;Therapeutic activities;Therapeutic exercise;Balance training;Neuromuscular re-education;Manual techniques;Patient/family education;Vasopneumatic Device;Iontophoresis 4mg /ml Dexamethasone;Gait training;Functional mobility training;Stair training;Dry needling    PT Next Visit Plan  continue to increase function, ROM, gait, functional work activities    Consulted and Agree with Plan of Care  Patient       Patient will benefit from skilled therapeutic intervention in order to improve the following deficits and impairments:  Abnormal gait, Improper body mechanics, Pain, Increased muscle spasms, Decreased mobility, Decreased activity tolerance, Decreased endurance, Decreased range of motion, Decreased strength, Impaired flexibility, Increased edema, Difficulty walking  Visit Diagnosis: Pain in left ankle and joints of left foot  Stiffness of left ankle, not elsewhere classified  Difficulty in walking, not elsewhere classified  Localized edema     Problem List Patient Active Problem List   Diagnosis Date Noted  . Chronic pain of left ankle 12/21/2019  . Allergic conjunctivitis 12/10/2019  . Chronic pain of right ankle 10/29/2019  . Left leg pain 10/29/2019  . Aortic valve regurgitation 06/24/2019  . Urinary incontinence 06/24/2019  . Parapelvic renal cyst 06/24/2019  . Chronic shoulder bursitis, left 06/24/2019  . Arthritis 06/24/2019  . Essential hypertension 03/18/2019  . Mid back pain 03/18/2019  . Muscle weakness (generalized) 03/18/2019  . Pleuritic chest pain 03/18/2019  . Menorrhagia 08/10/2018  . Vitamin D deficiency 12/17/2017  . Iron deficiency anemia 11/13/2017  . Alopecia 11/03/2017  . Seborrheic dermatitis of scalp 11/03/2017  . Cyst of ovary 11/03/2017  . Atypical  squamous cells of undetermined significance on cytologic smear of cervix (ASC-US) 11/03/2017  . Right foot pain 11/03/2017  . Thrombocytosis ([place]) 10/31/2017  . Leg edema, left 06/11/2013  . Petechiae 06/11/2013  . LGSIL (low grade squamous intraepithelial dysplasia)   . Reflux   . Hyperlipidemia 04/23/2007  . GAD (generalized anxiety disorder) 04/23/2007  . DEPRESSION 04/23/2007  . GERD 04/23/2007  . GESTATIONAL DIABETES 04/23/2007  . DE QUERVAIN'S TENOSYNOVITIS 04/23/2007  Amador Cunas, PT, DPT Donald Prose Lamyiah Crawshaw 12/22/2019, 4:40 PM  [facility] [address] [place], [place], [address] Phone: [phone]   Fax:  [phone]  Name: [name] MRN: [id number] Date of Birth: 1971/05/08

## 2019-12-23 MED FILL — HYDROCHLOROTHIAZIDE 12.5 MG: 12.5 | 90 days supply | Qty: 90 | Fill #0

## 2019-12-23 MED FILL — NEO/POLYMYXIN/HC EAR SOLN: 3.5-10000-1 | 10 days supply | Qty: 10 | Fill #0

## 2019-12-24 ENCOUNTER — Ambulatory Visit (INDEPENDENT_AMBULATORY_CARE_PROVIDER_SITE_OTHER): Payer: No Typology Code available for payment source | Admitting: Internal Medicine

## 2019-12-24 ENCOUNTER — Encounter: Payer: Self-pay | Admitting: Internal Medicine

## 2019-12-24 ENCOUNTER — Other Ambulatory Visit: Payer: Self-pay

## 2019-12-24 VITALS — BP 130/76 | HR 78 | Temp 97.1°F | Ht 62.5 in | Wt 174.0 lb

## 2019-12-24 DIAGNOSIS — M542 Cervicalgia: Secondary | ICD-10-CM

## 2019-12-24 DIAGNOSIS — R9431 Abnormal electrocardiogram [ECG] [EKG]: Secondary | ICD-10-CM | POA: Diagnosis not present

## 2019-12-24 DIAGNOSIS — R42 Dizziness and giddiness: Secondary | ICD-10-CM | POA: Diagnosis not present

## 2019-12-24 DIAGNOSIS — R079 Chest pain, unspecified: Secondary | ICD-10-CM

## 2019-12-24 NOTE — Patient Instructions (Addendum)
Medication Instructions:  Your physician recommends that you continue on your current medications as directed. Please refer to the Current Medication list given to you today.  *If you need a refill on your cardiac medications before your next appointment, please call your pharmacy*   Testing/Procedures: ECHOCARDIOGRAM @ [address] - 3rd Floor - schedule ASAP  CAROTID DOPPLER @ Dr. Lysbeth Penner office   Follow-Up: At [facility], you and your health needs are our priority.  As part of our continuing mission to provide you with exceptional heart care, we have created designated Provider Care Teams.  These Care Teams include your primary Cardiologist (physician) and Advanced Practice Providers (APPs -  Physician Assistants and Nurse Practitioners) who all work together to provide you with the care you need, when you need it.  We recommend signing up for the patient portal called "MyChart".  Sign up information is provided on this After Visit Summary.  MyChart is used to connect with patients for Virtual Visits (Telemedicine).  Patients are able to view lab/test results, encounter notes, upcoming appointments, etc.  Non-urgent messages can be sent to your provider as well.   To learn more about what you can do with MyChart, go to [email or web address].    Your next appointment:   3-4 week(s) w/EKG check  The format for your next appointment:   In Person  Provider:   You may see Pixie Casino, MD or one of the following Advanced Practice Providers on your designated Care Team:    Almyra Deforest, PA-C  Fabian Sharp, PA-C or   Roby Lofts, [place]    Other Instructions

## 2019-12-24 NOTE — Progress Notes (Signed)
OFFICE NOTE  Chief Complaint:  Chest pain, fatigue, neck pain  Primary Care Physician: McLean-Scocuzza, Nino Glow, MD  HPI:  [name] is a 49 y.o. female with a past medial history significant for leg swelling. I had last seen her in 2019 for leg swelling - recently she had chest pain - more upper back and shoulder blade, constant, worse with deep breathing. She was seen in the ER and had a negative CT for PE. Subsequently, she saw Almyra Deforest, PA-C in the office. He ordered a CT coronary angiogram which was performed on 11/17/2018 - this demonstrated 0 (zero) coronary calcium, however, there was a small amount of non-calcified plaque in the LAD (10-20%) - aggressive risk factor modification was recommended. She had recent labs, including a lipid profile which showed an LDL of 223, HDL of 68 and trigs of 140. I reviewed these results with her today and my recommendations for aggressive dietary intervention and possible lipid lowering therapy to target LDL of <70.  07/06/2019  [name] returns today for follow-up. We recommended a repeat lipid profile which was not performed to help determine if therapy was recommended. This was performed and did demonstrate elevated LDL-C of 144, TC 221. Based on these findings, would recommend statin therapy.  12/24/2019  [name] is seen today in follow-up.  She was referred from her PCP who saw her a few days ago.  The time she was complaining of some chest discomfort and some dyspnea.  She is also had significant fatigue.  She did have COVID-19 in December but was not hospitalized with that yet did have symptoms.  EKG today shows some new acute EKG changes including T wave inversions in the inferior and lateral leads which were not present in the past.  QTc was top normal at 460 ms.  Blood pressure was normal today.  She was scheduled for routine follow-up echocardiogram in August due to her history of mild to moderate aortic insufficiency.  Fortunately, CT  coronary angiography last year showed no coronary calcium and minimal nonobstructive coronary disease.  Finally, she is describing pain at the left neck base.  She says she has been having some posterior headaches as well as dizziness.  PMHx:  Past Medical History:  Diagnosis Date  . Anemia   . Anxiety   . ASCUS favoring benign 12/2015   negative high risk HPV  . Chicken pox   . COVID-19 december  . GERD (gastroesophageal reflux disease)    during pregnancy   . 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 Hyperlipidemia   . Hypertension    during pregnancy   . 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 Reflux     Past Surgical History:  Procedure Laterality Date  . COLPOSCOPY    . LAPAROSCOPIC VAGINAL HYSTERECTOMY WITH SALPINGO OOPHORECTOMY Bilateral 08/10/2018   Procedure: LAPAROSCOPIC ASSISTED VAGINAL HYSTERECTOMY WITH BILATERAL SALPINGO OOPHORECTOMY;  Surgeon: [name], MD;  Location: [place];  Service: Gynecology;  Laterality: Bilateral;  . TONSILLECTOMY AND ADENOIDECTOMY      FAMHx:  Family History  Problem Relation Age of Onset  . Hypertension Mother   . Cancer Mother        LUNG CANCER/SMOKER  . Depression Mother   . Mental illness Mother   . Diabetes Father   . Hypertension Father   . Heart disease Father   . Hyperlipidemia Father   . Stroke Father   .  Hypertension Brother   . Diabetes Brother   . Depression Brother   . Hyperlipidemia Brother   . Cancer Maternal Uncle        ESOPHAGEAL CA  . Diabetes Cousin   . Heart disease Cousin   . Cancer Maternal Grandfather        Brain  . Stroke Paternal Grandmother   . Depression Maternal Grandmother   . Mental retardation Maternal Grandmother   . Stroke Paternal Grandfather     SOCHx:   reports that she quit smoking about 29 years ago. Her smoking use included cigarettes. She has never used smokeless tobacco. She reports previous alcohol use. She reports  that she does not use drugs.  ALLERGIES:  Allergies  Allergen Reactions  . Codeine Shortness Of Breath    Breathing problems  - liquid med   . Erythromycin     GI upset   . Levofloxacin     Heart racing     ROS: Pertinent items noted in HPI and remainder of comprehensive ROS otherwise negative.  HOME MEDS: Current Outpatient Medications on File Prior to Visit  Medication Sig Dispense Refill  . albuterol (VENTOLIN HFA) 108 (90 Base) MCG/ACT inhaler Inhale 1-2 puffs into the lungs every 6 (six) hours as needed for wheezing or shortness of breath. 18 g 2  . carvedilol (COREG) 3.125 MG tablet Take 1 tablet (3.125 mg total) by mouth 2 (two) times daily with a meal. 180 tablet 3  . cyclobenzaprine (FLEXERIL) 5 MG tablet Take 1 tablet (5 mg total) by mouth at bedtime as needed for muscle spasms. 30 tablet 2  . hydrochlorothiazide (HYDRODIURIL) 12.5 MG tablet Take 0.5 tablets (6.25 mg total) by mouth daily. In am if BP still >130/>80 take 12.5 mg daily 90 tablet 3  . meloxicam (MOBIC) 7.5 MG tablet Take 1 tablet (7.5 mg total) by mouth daily. 10 tablet 0  . neomycin-polymyxin-hydrocortisone (CORTISPORIN) OTIC solution Place 4 drops into the left ear 3 (three) times daily. X 1 week 10 mL 0  . rosuvastatin (CRESTOR) 20 MG tablet Take 1 tablet (20 mg total) by mouth daily. 90 tablet 3  . VITAMIN D PO Take 5,000 Units by mouth daily.      No current facility-administered medications on file prior to visit.    LABS/IMAGING: No results found for this or any previous visit (from the past 48 hour(s)). No results found.  LIPID PANEL:    Component Value Date/Time   CHOL 155 12/09/2019 0931   CHOL 221 (H) 07/06/2019 1004   TRIG 91.0 12/09/2019 0931   HDL 57.40 12/09/2019 0931   HDL 61 07/06/2019 1004   CHOLHDL 3 12/09/2019 0931   VLDL 18.2 12/09/2019 0931   LDLCALC 80 12/09/2019 0931   LDLCALC 144 (H) 07/06/2019 1004     WEIGHTS: Wt Readings from Last 3 Encounters:  12/24/19 174 lb  (78.9 kg)  12/21/19 173 lb 6.4 oz (78.7 kg)  12/03/19 170 lb (77.1 kg)    VITALS: BP 130/76   Pulse 78   Temp (!) 97.1 F (36.2 C)   Ht 5' 2.5" (1.588 m)   Wt 174 lb (78.9 kg)   LMP 08/02/2018 (Exact Date)   SpO2 97%   BMI 31.32 kg/m   EXAM: General appearance: alert, no distress and Anxious Neck: no adenopathy, no carotid bruit, no JVD and thyroid not enlarged, symmetric, no tenderness/mass/nodules Lungs: clear to auscultation bilaterally Heart: regular rate and rhythm, S1, S2 normal and diastolic murmur: mid diastolic  2/6, blowing at apex Abdomen: soft, non-tender; bowel sounds normal; no masses,  no organomegaly Extremities: extremities normal, atraumatic, no cyanosis or edema Pulses: 2+ and symmetric Skin: Skin color, texture, turgor normal. No rashes or lesions Neurologic: Grossly normal Psych: Pleasant  EKG: Normal sinus rhythm at 78, inferior and anterolateral T wave inversions, QTC 460 ms-personally reviewed  ASSESSMENT: 1. Atypical chest pain however new inferior and anterolateral T wave changes 2. Low risk cardiac CT with CAC of 0, mild non-calcified plaque of the LAD (11/2018) 3. Dyslipidemia - goal LDL <70 4. Hypertension 5. Mild to moderate aortic insufficiency 6. Recent COVID-19 infection (08/2019) 7. Left neck pain  PLAN: 1.   [name] is describing somewhat atypical chest pain however has some new inferior and anterolateral T wave changes.  This seems to diffuse to be coronary disease but could represent a pericarditis.  She did have COVID-19 in December.  Her symptoms have worsened since then.  She also has history of mild to moderate aortic insufficiency and I think would be wise to check her echo sooner.  With regards to left neck pain, we will get carotid Dopplers however my suspicion for stenosis is low.  Plan follow-up with me afterwards.  Pixie Casino, MD, [facility], [place] Director of the Advanced Lipid  Disorders &  Cardiovascular Risk Reduction Clinic Diplomate of the American Board of Clinical Lipidology Attending Cardiologist  Direct Dial: [phone]  Fax: [phone]  Website:  [email or web address] 12/24/2019, 3:38 PM

## 2019-12-27 ENCOUNTER — Ambulatory Visit: Payer: PRIVATE HEALTH INSURANCE | Admitting: Physical Therapy

## 2019-12-28 ENCOUNTER — Other Ambulatory Visit: Payer: Self-pay

## 2019-12-28 ENCOUNTER — Ambulatory Visit (HOSPITAL_BASED_OUTPATIENT_CLINIC_OR_DEPARTMENT_OTHER)
Admission: RE | Admit: 2019-12-28 | Discharge: 2019-12-28 | Disposition: A | Discharge status: Home / Self Care | Payer: No Typology Code available for payment source | Source: Ambulatory Visit | Attending: Internal Medicine | Admitting: Internal Medicine

## 2019-12-28 ENCOUNTER — Telehealth: Payer: Self-pay | Admitting: Internal Medicine

## 2019-12-28 ENCOUNTER — Ambulatory Visit (HOSPITAL_COMMUNITY)
Admission: RE | Admit: 2019-12-28 | Discharge: 2019-12-28 | Disposition: A | Discharge status: Home / Self Care | Payer: No Typology Code available for payment source | Source: Ambulatory Visit | Attending: Internal Medicine | Admitting: Internal Medicine

## 2019-12-28 DIAGNOSIS — I10 Essential (primary) hypertension: Secondary | ICD-10-CM | POA: Insufficient documentation

## 2019-12-28 DIAGNOSIS — Z8616 Personal history of COVID-19: Secondary | ICD-10-CM | POA: Insufficient documentation

## 2019-12-28 DIAGNOSIS — R42 Dizziness and giddiness: Secondary | ICD-10-CM | POA: Diagnosis not present

## 2019-12-28 DIAGNOSIS — E785 Hyperlipidemia, unspecified: Secondary | ICD-10-CM | POA: Insufficient documentation

## 2019-12-28 DIAGNOSIS — I351 Nonrheumatic aortic (valve) insufficiency: Secondary | ICD-10-CM | POA: Diagnosis present

## 2019-12-28 DIAGNOSIS — Z87891 Personal history of nicotine dependence: Secondary | ICD-10-CM | POA: Diagnosis not present

## 2019-12-28 DIAGNOSIS — M542 Cervicalgia: Secondary | ICD-10-CM | POA: Insufficient documentation

## 2019-12-28 NOTE — Progress Notes (Signed)
  Echocardiogram 2D Echocardiogram has been performed.  [name] A [name] 12/28/2019, 9:01 AM

## 2019-12-28 NOTE — Telephone Encounter (Signed)
Still waiting on echo result - don't see it for me to read today, I guess she had it in [place]?  DR H

## 2019-12-28 NOTE — Telephone Encounter (Signed)
Patient of Dr. Debara Pickett walked in asking if she can return to work tmr. She has been on light, desk duty for a few months d/t orthopedic issue but is scheduled to start routine work tomorrow @ 10am. She lifts/pushes/pulls 20lbs on average up to 50lbs. She would like MD to advise her.   She plans to sign up for MyChart for ease of communication   Routed to MD

## 2019-12-28 NOTE — Telephone Encounter (Signed)
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 She is supposed to go back to work tomorrow.

## 2019-12-29 ENCOUNTER — Ambulatory Visit: Payer: PRIVATE HEALTH INSURANCE | Admitting: Physical Therapy

## 2019-12-29 ENCOUNTER — Telehealth: Payer: Self-pay | Admitting: Internal Medicine

## 2019-12-29 NOTE — Telephone Encounter (Signed)
Patient informed and verbalized understanding. She has already called her cardiologist on this issue.   Pt states that she is having lower back pain and odor when urinating. She would like to add a UA on th the labs she is to have at [facility]. She would also like to add full panel STD testing since she will be giving blood and urine. Pt would like the STD testing to check for everything.

## 2019-12-29 NOTE — Telephone Encounter (Signed)
Please advise 

## 2019-12-29 NOTE — Telephone Encounter (Signed)
Faxed

## 2019-12-29 NOTE — Telephone Encounter (Signed)
She needs to get approval from cardiology for this since heart issue

## 2019-12-29 NOTE — Telephone Encounter (Signed)
-----   Message from Delorise Jackson, MD sent at 12/08/2019  7:55 AM EDT ----- Fax note with this on cover Dr. Elwyn Lade ortho in [place] pain of right ankleLeft leg painCC Dr. Elwyn Lade ortho pt wants left leg pain calf, thigh buttock addressed as well as right ankle please and may need further imaging if deemed needed by ortho I.e MRI right ankle

## 2019-12-30 NOTE — Telephone Encounter (Signed)
I don't see any reason she could not go back to work.  Dr Lemmie Evens

## 2019-12-30 NOTE — Telephone Encounter (Signed)
Echo and carotid have resulted Any advice on her work?

## 2019-12-30 NOTE — Telephone Encounter (Signed)
Left message on [phone] (Home) *Preferred* with MD message, as noted below. Also sent to patient via [place]

## 2020-01-03 ENCOUNTER — Encounter: Payer: Self-pay | Admitting: Podiatry

## 2020-01-03 ENCOUNTER — Other Ambulatory Visit: Payer: Self-pay

## 2020-01-03 ENCOUNTER — Ambulatory Visit (INDEPENDENT_AMBULATORY_CARE_PROVIDER_SITE_OTHER): Payer: No Typology Code available for payment source

## 2020-01-03 ENCOUNTER — Ambulatory Visit: Payer: No Typology Code available for payment source | Admitting: Podiatry

## 2020-01-03 VITALS — Temp 97.3°F | Resp 14

## 2020-01-03 DIAGNOSIS — T148XXA Other injury of unspecified body region, initial encounter: Secondary | ICD-10-CM | POA: Diagnosis not present

## 2020-01-03 DIAGNOSIS — S92301S Fracture of unspecified metatarsal bone(s), right foot, sequela: Secondary | ICD-10-CM | POA: Diagnosis not present

## 2020-01-03 DIAGNOSIS — T1490XA Injury, unspecified, initial encounter: Secondary | ICD-10-CM | POA: Diagnosis not present

## 2020-01-03 NOTE — Telephone Encounter (Signed)
Inform can go for labs anytime in the future make sure all issues disc'ed at appt time   [place]

## 2020-01-03 NOTE — Telephone Encounter (Signed)
Yes received thanks tMS

## 2020-01-03 NOTE — Addendum Note (Signed)
Addended by: Orland Mustard on: 01/03/2020 03:52 PM   Modules accepted: Orders

## 2020-01-03 NOTE — Progress Notes (Signed)
Subjective:   Patient ID: [name], female   DOB: 49 y.o.   MRN: [id number]   HPI 49 year old female presents the office today for concerns of left foot pain.  She states that she twisted her foot on July 20, 2019 at work.  She was seen in urgent care and she later follow-up with orthopedics for this.  She underwent time of immobilization in a cam boot before starting physical therapy.  An MRI performed as well which showed bone contusion as well as possible nondisplaced fracture of the cuboid.  She does feel that overall she has been making good progress in starting physical therapy but she still has discomfort point the lateral aspect the foot.  Occasional swelling.  She states is uncompensated the right foot is been hurting.  She previously had a fracture of the right fifth metatarsal.  No other concerns today.   Review of Systems  All other systems reviewed and are negative.  Past Medical History:  Diagnosis Date  . Anemia   . Anxiety   . ASCUS favoring benign 12/2015   negative high risk HPV  . Chicken pox   . COVID-19 december  . GERD (gastroesophageal reflux disease)    during pregnancy   . 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 Hyperlipidemia   . Hypertension    during pregnancy   . 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 Reflux     Past Surgical History:  Procedure Laterality Date  . COLPOSCOPY    . LAPAROSCOPIC VAGINAL HYSTERECTOMY WITH SALPINGO OOPHORECTOMY Bilateral 08/10/2018   Procedure: LAPAROSCOPIC ASSISTED VAGINAL HYSTERECTOMY WITH BILATERAL SALPINGO OOPHORECTOMY;  Surgeon: [name], MD;  Location: [place];  Service: Gynecology;  Laterality: Bilateral;  . TONSILLECTOMY AND ADENOIDECTOMY       Current Outpatient Medications:  .  albuterol (VENTOLIN HFA) 108 (90 Base) MCG/ACT inhaler, Inhale 1-2 puffs into the lungs every 6 (six) hours as needed for wheezing or shortness of breath., Disp: 18 g,  Rfl: 2 .  carvedilol (COREG) 3.125 MG tablet, Take 1 tablet (3.125 mg total) by mouth 2 (two) times daily with a meal., Disp: 180 tablet, Rfl: 3 .  cyclobenzaprine (FLEXERIL) 5 MG tablet, Take 1 tablet (5 mg total) by mouth at bedtime as needed for muscle spasms., Disp: 30 tablet, Rfl: 2 .  hydrochlorothiazide (HYDRODIURIL) 12.5 MG tablet, Take 0.5 tablets (6.25 mg total) by mouth daily. In am if BP still >130/>80 take 12.5 mg daily, Disp: 90 tablet, Rfl: 3 .  meloxicam (MOBIC) 7.5 MG tablet, Take 1 tablet (7.5 mg total) by mouth daily., Disp: 10 tablet, Rfl: 0 .  neomycin-polymyxin-hydrocortisone (CORTISPORIN) OTIC solution, Place 4 drops into the left ear 3 (three) times daily. X 1 week, Disp: 10 mL, Rfl: 0 .  rosuvastatin (CRESTOR) 20 MG tablet, Take 1 tablet (20 mg total) by mouth daily., Disp: 90 tablet, Rfl: 3 .  VITAMIN D PO, Take 5,000 Units by mouth daily. , Disp: , Rfl:   Allergies  Allergen Reactions  . Codeine Shortness Of Breath    Breathing problems  - liquid med   . Erythromycin     GI upset   . Levofloxacin     Heart racing        Objective:  Physical Exam  General: AAO x3, NAD  Dermatological: Skin is warm, dry and supple bilateral. Nails x 10 are well manicured; remaining integument appears unremarkable at  this time. There are no open sores, no preulcerative lesions, no rash or signs of infection present.  Vascular: Dorsalis Pedis artery and Posterior Tibial artery pedal pulses are 2/4 bilateral with immedate capillary fill time. Pedal hair growth present. No varicosities and no lower extremity edema present bilateral. There is no pain with calf compression, swelling, warmth, erythema.   Neruologic: Grossly intact via light touch bilateral.   Musculoskeletal: There is mild tenderness palpation lateral aspect of the left foot along the cuboid and minimally to the fifth metatarsal base.  There is no pain to the peroneal tendon.  There is no significant edema there is no  erythema or warmth.  Flexor, extensor tendons appear to be intact.  Minimal tenderness of the right fifth metatarsal.  No soft edema no erythema.  No pain with flexion, extensor tendon range of motion.  Muscular strength 5/5 in all groups tested bilateral.  Gait: Unassisted, Nonantalgic.       Assessment:   Bone contusion, possible nondisplaced fracture of cuboid    Plan:  -Treatment options discussed including all alternatives, risks, and complications -Etiology of symptoms were discussed -X-rays obtained reviewed of the right foot.  Evidence of prior partial fifth metatarsal with evidence of healing. -Independent review of the MRIs that she had done previously. -At this time she is continuing to have discomfort but she is making improvement.  We will try to order a bone stimulator given that she has had pain for greater than 6 months and the MRI does show significant bone contusion as well as possible nondisplaced fracture of the cuboid.  Also continue physical therapy.  Return in about 6 weeks (around 02/14/2020).  Trula Slade DPM

## 2020-01-03 NOTE — Addendum Note (Signed)
Addended by: Orland Mustard on: 01/03/2020 03:35 PM   Modules accepted: Orders

## 2020-01-03 NOTE — Telephone Encounter (Signed)
Did you receive these records?

## 2020-01-04 ENCOUNTER — Telehealth: Payer: Self-pay | Admitting: *Deleted

## 2020-01-04 DIAGNOSIS — T1490XA Injury, unspecified, initial encounter: Secondary | ICD-10-CM

## 2020-01-04 DIAGNOSIS — S92301S Fracture of unspecified metatarsal bone(s), right foot, sequela: Secondary | ICD-10-CM

## 2020-01-04 DIAGNOSIS — T148XXA Other injury of unspecified body region, initial encounter: Secondary | ICD-10-CM

## 2020-01-04 NOTE — Telephone Encounter (Signed)
-----   Message from Trula Slade, DPM sent at 01/03/2020  1:15 PM EDT ----- Can you please order physical therapy be done at [facility] on [facility] [address]?  Thank you

## 2020-01-05 NOTE — Telephone Encounter (Signed)
I want to continue PT as ordered. She wants to switch locations.  I have spoken with Lytle Michaels about the bone stimulator.

## 2020-01-05 NOTE — Telephone Encounter (Signed)
Patient informed and verbalized understanding

## 2020-01-06 NOTE — Telephone Encounter (Signed)
Dr. Jacqualyn Posey states send orders for Evaluation and Treatment. Faxed orders, clinicals and demographics to [facility].

## 2020-01-06 NOTE — Telephone Encounter (Deleted)
-----   Message from Trula Slade, DPM sent at 01/03/2020  1:15 PM EDT ----- Can you please order physical therapy be done at [facility] on [address]?  Thank you

## 2020-01-07 MED FILL — CARVEDILOL 3.125 MG TABLET: 3.125 | 90 days supply | Qty: 180 | Fill #2

## 2020-01-17 ENCOUNTER — Encounter: Payer: Self-pay | Admitting: Internal Medicine

## 2020-01-17 ENCOUNTER — Other Ambulatory Visit: Payer: Self-pay

## 2020-01-17 ENCOUNTER — Ambulatory Visit (INDEPENDENT_AMBULATORY_CARE_PROVIDER_SITE_OTHER): Payer: No Typology Code available for payment source | Admitting: Internal Medicine

## 2020-01-17 VITALS — BP 128/86 | HR 67 | Ht 62.5 in | Wt 175.0 lb

## 2020-01-17 DIAGNOSIS — R079 Chest pain, unspecified: Secondary | ICD-10-CM

## 2020-01-17 DIAGNOSIS — I351 Nonrheumatic aortic (valve) insufficiency: Secondary | ICD-10-CM

## 2020-01-17 DIAGNOSIS — R9431 Abnormal electrocardiogram [ECG] [EKG]: Secondary | ICD-10-CM

## 2020-01-17 DIAGNOSIS — I10 Essential (primary) hypertension: Secondary | ICD-10-CM

## 2020-01-17 NOTE — Addendum Note (Signed)
Addended by: Fidel Levy on: 01/17/2020 12:55 PM   Modules accepted: Orders

## 2020-01-17 NOTE — Patient Instructions (Signed)

## 2020-01-17 NOTE — Progress Notes (Signed)
OFFICE NOTE  Chief Complaint:  Follow-up chest pain, fatigue, neck pain  Primary Care Physician: McLean-Scocuzza, Nino Glow, MD  HPI:  [name] is a 49 y.o. female with a past medial history significant for leg swelling. I had last seen her in 2019 for leg swelling - recently she had chest pain - more upper back and shoulder blade, constant, worse with deep breathing. She was seen in the ER and had a negative CT for PE. Subsequently, she saw Almyra Deforest, PA-C in the office. He ordered a CT coronary angiogram which was performed on 11/17/2018 - this demonstrated 0 (zero) coronary calcium, however, there was a small amount of non-calcified plaque in the LAD (10-20%) - aggressive risk factor modification was recommended. She had recent labs, including a lipid profile which showed an LDL of 223, HDL of 68 and trigs of 140. I reviewed these results with her today and my recommendations for aggressive dietary intervention and possible lipid lowering therapy to target LDL of <70.  07/06/2019  [name] returns today for follow-up. We recommended a repeat lipid profile which was not performed to help determine if therapy was recommended. This was performed and did demonstrate elevated LDL-C of 144, TC 221. Based on these findings, would recommend statin therapy.  12/24/2019  [name] is seen today in follow-up.  She was referred from her PCP who saw her a few days ago.  The time she was complaining of some chest discomfort and some dyspnea.  She is also had significant fatigue.  She did have COVID-19 in December but was not hospitalized with that yet did have symptoms.  EKG today shows some new acute EKG changes including T wave inversions in the inferior and lateral leads which were not present in the past.  QTc was top normal at 460 ms.  Blood pressure was normal today.  She was scheduled for routine follow-up echocardiogram in August due to her history of mild to moderate aortic insufficiency.  Fortunately,  CT coronary angiography last year showed no coronary calcium and minimal nonobstructive coronary disease.  Finally, she is describing pain at the left neck base.  She says she has been having some posterior headaches as well as dizziness.  01/17/2020  [name] returns for follow-up.  She underwent an echo as well as carotid Dopplers.  The Dopplers were only significant for mild carotid artery disease with no subclavian stenosis.  No aneurysm or anything to explain the mass she felt in the base of her neck.  Echo was also stable showing mild to moderate aortic insufficiency with normal systolic function.  There are no clear explanations for the mild T wave changes on her EKG.  PMHx:  Past Medical History:  Diagnosis Date  . Anemia   . Anxiety   . ASCUS favoring benign 12/2015   negative high risk HPV  . Chicken pox   . COVID-19 december  . GERD (gastroesophageal reflux disease)    during pregnancy   . 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 Hyperlipidemia   . Hypertension    during pregnancy   . 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 Reflux     Past Surgical History:  Procedure Laterality Date  . COLPOSCOPY    . LAPAROSCOPIC VAGINAL HYSTERECTOMY WITH SALPINGO OOPHORECTOMY Bilateral 08/10/2018   Procedure: LAPAROSCOPIC ASSISTED VAGINAL HYSTERECTOMY WITH BILATERAL SALPINGO OOPHORECTOMY;  Surgeon: [name], MD;  Location: [facility];  Service:  Gynecology;  Laterality: Bilateral;  . TONSILLECTOMY AND ADENOIDECTOMY      FAMHx:  Family History  Problem Relation Age of Onset  . Hypertension Mother   . Cancer Mother        LUNG CANCER/SMOKER  . Depression Mother   . Mental illness Mother   . Diabetes Father   . Hypertension Father   . Heart disease Father   . Hyperlipidemia Father   . Stroke Father   . Hypertension Brother   . Diabetes Brother   . Depression Brother   . Hyperlipidemia Brother   . Cancer Maternal Uncle         ESOPHAGEAL CA  . Diabetes Cousin   . Heart disease Cousin   . Cancer Maternal Grandfather        Brain  . Stroke Paternal Grandmother   . Depression Maternal Grandmother   . Mental retardation Maternal Grandmother   . Stroke Paternal Grandfather     SOCHx:   reports that she quit smoking about 29 years ago. Her smoking use included cigarettes. She has never used smokeless tobacco. She reports previous alcohol use. She reports that she does not use drugs.  ALLERGIES:  Allergies  Allergen Reactions  . Codeine Shortness Of Breath    Breathing problems  - liquid med   . Erythromycin     GI upset   . Levofloxacin     Heart racing     ROS: Pertinent items noted in HPI and remainder of comprehensive ROS otherwise negative.  HOME MEDS: Current Outpatient Medications on File Prior to Visit  Medication Sig Dispense Refill  . albuterol (VENTOLIN HFA) 108 (90 Base) MCG/ACT inhaler Inhale 1-2 puffs into the lungs every 6 (six) hours as needed for wheezing or shortness of breath. 18 g 2  . carvedilol (COREG) 3.125 MG tablet Take 1 tablet (3.125 mg total) by mouth 2 (two) times daily with a meal. 180 tablet 3  . cyclobenzaprine (FLEXERIL) 5 MG tablet Take 1 tablet (5 mg total) by mouth at bedtime as needed for muscle spasms. 30 tablet 2  . hydrochlorothiazide (HYDRODIURIL) 12.5 MG tablet Take 0.5 tablets (6.25 mg total) by mouth daily. In am if BP still >130/>80 take 12.5 mg daily 90 tablet 3  . meloxicam (MOBIC) 7.5 MG tablet Take 1 tablet (7.5 mg total) by mouth daily. 10 tablet 0  . neomycin-polymyxin-hydrocortisone (CORTISPORIN) OTIC solution Place 4 drops into the left ear 3 (three) times daily. X 1 week 10 mL 0  . rosuvastatin (CRESTOR) 20 MG tablet Take 1 tablet (20 mg total) by mouth daily. 90 tablet 3  . VITAMIN D PO Take 5,000 Units by mouth daily.      No current facility-administered medications on file prior to visit.    LABS/IMAGING: No results found for this or any  previous visit (from the past 48 hour(s)). No results found.  LIPID PANEL:    Component Value Date/Time   CHOL 155 12/09/2019 0931   CHOL 221 (H) 07/06/2019 1004   TRIG 91.0 12/09/2019 0931   HDL 57.40 12/09/2019 0931   HDL 61 07/06/2019 1004   CHOLHDL 3 12/09/2019 0931   VLDL 18.2 12/09/2019 0931   LDLCALC 80 12/09/2019 0931   LDLCALC 144 (H) 07/06/2019 1004     WEIGHTS: Wt Readings from Last 3 Encounters:  01/17/20 175 lb (79.4 kg)  12/24/19 174 lb (78.9 kg)  12/21/19 173 lb 6.4 oz (78.7 kg)    VITALS: BP 128/86   Pulse  67   Ht 5' 2.5" (1.588 m)   Wt 175 lb (79.4 kg)   LMP 08/02/2018 (Exact Date)   BMI 31.50 kg/m   EXAM: General appearance: alert, no distress and Anxious Neck: no adenopathy, no carotid bruit, no JVD and thyroid not enlarged, symmetric, no tenderness/mass/nodules Lungs: clear to auscultation bilaterally Heart: regular rate and rhythm, S1, S2 normal and diastolic murmur: mid diastolic 2/6, blowing at apex Abdomen: soft, non-tender; bowel sounds normal; no masses,  no organomegaly Extremities: extremities normal, atraumatic, no cyanosis or edema Pulses: 2+ and symmetric Skin: Skin color, texture, turgor normal. No rashes or lesions Neurologic: Grossly normal Psych: Pleasant  EKG: Normal sinus rhythm 67, nonspecific ST-T wave changes-personally reviewed  ASSESSMENT: 1. Atypical chest pain however new inferior and anterolateral T wave changes 2. Low risk cardiac CT with CAC of 0, mild non-calcified plaque of the LAD (11/2018) 3. Dyslipidemia - goal LDL <70 4. Hypertension 5. Mild to moderate aortic insufficiency 6. Recent COVID-19 infection (08/2019) 7. Left neck pain  PLAN: 1.   [name] is still quite anxious about the findings on her EKG however they are nonspecific and I think low risk.  She had no coronary calcium and some very mild plaque in the LAD by CT which were treating medically.  Blood pressures well controlled.  Her aortic insufficiency  is stable.  We will plan to repeat echo in a year.  The left neck pain should be deferred to her PCP to further evaluate.  It might be related to cervical spine disease.  Follow-up with me annually or sooner as necessary.  Pixie Casino, MD, [facility], [place] Director of the [facility]  [facility] Diplomate of the American Board of Clinical Lipidology Attending Cardiologist  Direct Dial: [phone]  Fax: [phone]  Website:  [email or web address] [email or web address] Hartman Minahan 01/17/2020, 12:06 PM

## 2020-01-21 ENCOUNTER — Institutional Professional Consult (permissible substitution): Payer: No Typology Code available for payment source | Admitting: Pulmonary Disease

## 2020-02-14 ENCOUNTER — Ambulatory Visit: Payer: No Typology Code available for payment source | Admitting: Podiatry

## 2020-02-15 MED FILL — CLOBETASOL PROPIONATE 0.05: 0.05 | 30 days supply | Qty: 25 | Fill #0

## 2020-02-17 ENCOUNTER — Other Ambulatory Visit: Payer: Self-pay

## 2020-02-17 ENCOUNTER — Encounter: Payer: Self-pay | Admitting: Pulmonary Disease

## 2020-02-17 ENCOUNTER — Ambulatory Visit (INDEPENDENT_AMBULATORY_CARE_PROVIDER_SITE_OTHER): Payer: No Typology Code available for payment source | Admitting: Pulmonary Disease

## 2020-02-17 VITALS — BP 130/82 | HR 61 | Temp 97.7°F | Ht 62.5 in | Wt 174.2 lb

## 2020-02-17 DIAGNOSIS — R0683 Snoring: Secondary | ICD-10-CM

## 2020-02-17 NOTE — Patient Instructions (Signed)
Moderate probability of significant obstructive sleep apnea  We will schedule you for home sleep study Update your results as reviewed  Treatment options as we discussed  I will follow-up with you in about 2 to 3 months Sleep Apnea Sleep apnea is a condition in which breathing pauses or becomes shallow during sleep. Episodes of sleep apnea usually last 10 seconds or longer, and they may occur as many as 20 times an hour. Sleep apnea disrupts your sleep and keeps your body from getting the rest that it needs. This condition can increase your risk of certain health problems, including:  Heart attack.  Stroke.  Obesity.  Diabetes.  Heart failure.  Irregular heartbeat. What are the causes? There are three kinds of sleep apnea:  Obstructive sleep apnea. This kind is caused by a blocked or collapsed airway.  Central sleep apnea. This kind happens when the part of the brain that controls breathing does not send the correct signals to the muscles that control breathing.  Mixed sleep apnea. This is a combination of obstructive and central sleep apnea. The most common cause of this condition is a collapsed or blocked airway. An airway can collapse or become blocked if:  Your throat muscles are abnormally relaxed.  Your tongue and tonsils are larger than normal.  You are overweight.  Your airway is smaller than normal. What increases the risk? You are more likely to develop this condition if you:  Are overweight.  Smoke.  Have a smaller than normal airway.  Are elderly.  Are female.  Drink alcohol.  Take sedatives or tranquilizers.  Have a family history of sleep apnea. What are the signs or symptoms? Symptoms of this condition include:  Trouble staying asleep.  Daytime sleepiness and tiredness.  Irritability.  Loud snoring.  Morning headaches.  Trouble concentrating.  Forgetfulness.  Decreased interest in sex.  Unexplained sleepiness.  Mood  swings.  Personality changes.  Feelings of depression.  Waking up often during the night to urinate.  Dry mouth.  Sore throat. How is this diagnosed? This condition may be diagnosed with:  A medical history.  A physical exam.  A series of tests that are done while you are sleeping (sleep study). These tests are usually done in a sleep lab, but they may also be done at home. How is this treated? Treatment for this condition aims to restore normal breathing and to ease symptoms during sleep. It may involve managing health issues that can affect breathing, such as high blood pressure or obesity. Treatment may include:  Sleeping on your side.  Using a decongestant if you have nasal congestion.  Avoiding the use of depressants, including alcohol, sedatives, and narcotics.  Losing weight if you are overweight.  Making changes to your diet.  Quitting smoking.  Using a device to open your airway while you sleep, such as: ? An oral appliance. This is a custom-made mouthpiece that shifts your lower jaw forward. ? A continuous positive airway pressure (CPAP) device. This device blows air through a mask when you breathe out (exhale). ? A nasal expiratory positive airway pressure (EPAP) device. This device has valves that you put into each nostril. ? A bi-level positive airway pressure (BPAP) device. This device blows air through a mask when you breathe in (inhale) and breathe out (exhale).  Having surgery if other treatments do not work. During surgery, excess tissue is removed to create a wider airway. It is important to get treatment for sleep apnea. Without treatment, this  condition can lead to:  High blood pressure.  Coronary artery disease.  In men, an inability to achieve or maintain an erection (impotence).  Reduced thinking abilities. Follow these instructions at home: Lifestyle  Make any lifestyle changes that your health care provider recommends.  Eat a healthy,  well-balanced diet.  Take steps to lose weight if you are overweight.  Avoid using depressants, including alcohol, sedatives, and narcotics.  Do not use any products that contain nicotine or tobacco, such as cigarettes, e-cigarettes, and chewing tobacco. If you need help quitting, ask your health care provider. General instructions  Take over-the-counter and prescription medicines only as told by your health care provider.  If you were given a device to open your airway while you sleep, use it only as told by your health care provider.  If you are having surgery, make sure to tell your health care provider you have sleep apnea. You may need to bring your device with you.  Keep all follow-up visits as told by your health care provider. This is important. Contact a health care provider if:  The device that you received to open your airway during sleep is uncomfortable or does not seem to be working.  Your symptoms do not improve.  Your symptoms get worse. Get help right away if:  You develop: ? Chest pain. ? Shortness of breath. ? Discomfort in your back, arms, or stomach.  You have: ? Trouble speaking. ? Weakness on one side of your body. ? Drooping in your face. These symptoms may represent a serious problem that is an emergency. Do not wait to see if the symptoms will go away. Get medical help right away. Call your local emergency services (911 in the [place] Do not drive yourself to the hospital. Summary  Sleep apnea is a condition in which breathing pauses or becomes shallow during sleep.  The most common cause is a collapsed or blocked airway.  The goal of treatment is to restore normal breathing and to ease symptoms during sleep. This information is not intended to replace advice given to you by your health care provider. Make sure you discuss any questions you have with your health care provider. Document Revised: 02/03/2019 Document Reviewed: 04/14/2018 Elsevier  Patient Education  [place].

## 2020-02-17 NOTE — Progress Notes (Signed)
[name]    [id number]    [birth year]  Primary Care Physician:McLean-Scocuzza, Nino Glow, MD  Referring Physician: McLean-Scocuzza, Nino Glow, MD [place],  [place] [address]  Chief complaint:   Patient being seen for excessive daytime sleepiness, snoring,  HPI:  History of snoring for many years Witnessed apneas  Usually goes to bed about midnight Final wake up time between 5 and 6 AM Wakes up about once during the night to use the bathroom Weight has been stable  She does have a history of morning dryness No headaches Occasional night sweats Memory is foggy sometimes  She is excessively sleepy during the day She has had gasping respirations at night  No family history of obstructive sleep apnea that she can recollect  Reformed smoker   Outpatient Encounter Medications as of 02/17/2020  Medication Sig   aspirin EC 81 MG tablet Take 81 mg by mouth daily. Swallow whole.   carvedilol (COREG) 3.125 MG tablet Take 1 tablet (3.125 mg total) by mouth 2 (two) times daily with a meal.   hydrochlorothiazide (HYDRODIURIL) 12.5 MG tablet Take 0.5 tablets (6.25 mg total) by mouth daily. In am if BP still >130/>80 take 12.5 mg daily   VITAMIN D PO Take 5,000 Units by mouth daily.    albuterol (VENTOLIN HFA) 108 (90 Base) MCG/ACT inhaler Inhale 1-2 puffs into the lungs every 6 (six) hours as needed for wheezing or shortness of breath. (Patient not taking: Reported on 02/17/2020)   cyclobenzaprine (FLEXERIL) 5 MG tablet Take 1 tablet (5 mg total) by mouth at bedtime as needed for muscle spasms. (Patient not taking: Reported on 02/17/2020)   meloxicam (MOBIC) 7.5 MG tablet Take 1 tablet (7.5 mg total) by mouth daily. (Patient not taking: Reported on 02/17/2020)   rosuvastatin (CRESTOR) 20 MG tablet Take 1 tablet (20 mg total) by mouth daily. (Patient not taking: Reported on 02/17/2020)   [DISCONTINUED] neomycin-polymyxin-hydrocortisone (CORTISPORIN) OTIC  solution Place 4 drops into the left ear 3 (three) times daily. X 1 week   No facility-administered encounter medications on file as of 02/17/2020.    Allergies as of 02/17/2020 - Review Complete 02/17/2020  Allergen Reaction Noted   Codeine Shortness Of Breath 04/23/2007   Erythromycin  04/23/2007   Levofloxacin  05/16/2011    Past Medical History:  Diagnosis Date   Anemia    Anxiety    ASCUS favoring benign 12/2015   negative high risk HPV   Chicken pox    COVID-19 december   GERD (gastroesophageal reflux disease)    during pregnancy    Headache    frequent after MCA 2018    Hyperlipidemia    Hypertension    during pregnancy    LGSIL (low grade squamous intraepithelial dysplasia)    2004-2006   Reflux     Past Surgical History:  Procedure Laterality Date   COLPOSCOPY     LAPAROSCOPIC VAGINAL HYSTERECTOMY WITH SALPINGO OOPHORECTOMY Bilateral 08/10/2018   Procedure: LAPAROSCOPIC ASSISTED VAGINAL HYSTERECTOMY WITH BILATERAL SALPINGO OOPHORECTOMY;  Surgeon: [name], MD;  Location: [facility];  Service: Gynecology;  Laterality: Bilateral;   TONSILLECTOMY AND ADENOIDECTOMY      Family History  Problem Relation Age of Onset   Hypertension Mother    Cancer Mother        LUNG CANCER/SMOKER   Depression Mother    Mental illness Mother    Diabetes Father    Hypertension Father  Heart disease Father    Hyperlipidemia Father    Stroke Father    Hypertension Brother    Diabetes Brother    Depression Brother    Hyperlipidemia Brother    Cancer Maternal Uncle        ESOPHAGEAL CA   Diabetes Cousin    Heart disease Cousin    Cancer Maternal Grandfather        Brain   Stroke Paternal Grandmother    Depression Maternal Grandmother    Mental retardation Maternal Grandmother    Stroke Paternal Grandfather     Social History   Socioeconomic History   Marital status: Divorced    Spouse name: Not on  file   Number of children: 2   Years of education: Not on file   Highest education level: Not on file  Occupational History   Not on file  Tobacco Use   Smoking status: Former Smoker    Types: Cigarettes    Quit date: 09/02/1990    Years since quitting: 29.4   Smokeless tobacco: Never Used   Tobacco comment: smoked socially in high school   Vaping Use   Vaping Use: Never used  Substance and Sexual Activity   Alcohol use: Not Currently    Alcohol/week: 0.0 standard drinks    Comment: occasionally    Drug use: No   Sexual activity: Not Currently    Birth control/protection: Surgical    Comment: 1st intercourse 49 yo-More than 5 partners-Vasectomy  Other Topics Concern   Not on file  Social History Narrative   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[employer]    12 grade education    Wears seat belt    Safe in relationship   No guns    Divorced    Has kids    Social Determinants of Health   Financial Resource Strain:    Difficulty of Paying Living Expenses:   Food Insecurity:    Worried About Charity fundraiser in the Last Year:    Arboriculturist in the Last Year:   Transportation Needs:    Film/video editor (Medical):    Lack of Transportation (Non-Medical):   Physical Activity:    Days of Exercise per Week:    Minutes of Exercise per Session:   Stress:    Feeling of Stress :   Social Connections:    Frequency of Communication with Friends and Family:    Frequency of Social Gatherings with Friends and Family:    Attends Religious Services:    Active Member of Clubs or Organizations:    Attends Music therapist:    Marital Status:   Intimate Partner Violence:    Fear of Current or Ex-Partner:    Emotionally Abused:    Physically Abused:    Sexually Abused:     Review of Systems  Constitutional: Positive for fatigue.  Respiratory: Positive for apnea.   Psychiatric/Behavioral: Positive for sleep disturbance.    Vitals:   02/17/20  0923  BP: 130/82  Pulse: 61  Temp: 97.7 F (36.5 C)  SpO2: 98%     Physical Exam Constitutional:      Appearance: She is obese.  HENT:     Head: Normocephalic.     Nose: No congestion or rhinorrhea.     Mouth/Throat:     Mouth: Mucous membranes are moist.     Pharynx: No oropharyngeal exudate.     Comments: Mallampati 3, crowded oropharynx Eyes:     General:  Right eye: No discharge.        Left eye: No discharge.     Pupils: Pupils are equal, round, and reactive to light.  Cardiovascular:     Rate and Rhythm: Normal rate and regular rhythm.     Heart sounds: No murmur heard.  No friction rub.  Pulmonary:     Effort: Pulmonary effort is normal. No respiratory distress.     Breath sounds: Normal breath sounds. No stridor. No wheezing or rhonchi.  Neurological:     General: No focal deficit present.     Mental Status: She is alert.    Results of the Epworth flowsheet 02/17/2020  Sitting and reading 3  Watching TV 3  Sitting, inactive in a public place (e.g. a theatre or a meeting) 3  As a passenger in a car for an hour without a break 3  Lying down to rest in the afternoon when circumstances permit 3  Sitting and talking to someone 0  Sitting quietly after a lunch without alcohol 1  In a car, while stopped for a few minutes in traffic 1  Total score 17    Assessment:  Excessive daytime sleepiness  Moderate to high probability of significant obstructive sleep apnea  Pathophysiology of sleep disordered breathing discussed with the patient Treatment options for sleep disordered breathing discussed with the patient  Plan/Recommendations: Risk of not treating obstructive sleep apnea discussed  We will schedule the patient for home sleep study  Encouraged regarding exercise and weight loss  Follow-up in 2-3 months   Sherrilyn Rist MD [facility] 02/17/2020, 9:30 AM  CC: McLean-Scocuzza, Olivia Mackie *

## 2020-02-23 MED FILL — HYDROXYCHLOROQUINE SULFATE: 200 | 30 days supply | Qty: 60 | Fill #0

## 2020-02-24 ENCOUNTER — Telehealth: Payer: Self-pay | Admitting: Internal Medicine

## 2020-02-24 NOTE — Telephone Encounter (Signed)
Should be ok to take hydroxychloroquine (Plaquenil). Not sure about statin and her elevated bilirubin - agree with holding for now, we can discuss alternatives at follow-up.  Dr Lemmie Evens

## 2020-02-24 NOTE — Telephone Encounter (Signed)
New Message:  Pt says she have a skin disorder and they are treating her with Hydroxychlorquine( they use this drug to treat Malaria). She need to know if this medicine is alright to use with her other medicine and her condition.   She also wants Dr Debara Pickett to know she had to stop taking Rosuvastatin, because level was high.

## 2020-02-24 NOTE — Telephone Encounter (Signed)
Spoke to patient Dr.Hilty's advice given.Follow up appointment scheduled with Dr.Hilty 7/28 at 3:15 pm to discuss restarting a statin.

## 2020-02-24 NOTE — Telephone Encounter (Signed)
Returned call to patient she stated she wanted to ask Dr.Hilty if ok to take Hydroxychlorquine for a skin disorder.Also she cannot take Crestor caused elevated  Bilirubin,bloated feeling,sluggish.Stated she feels alittle better since she stopped taking.Stated her PCP will repeat bilirubin.She wanted to know if she needs to take another medication for cholesterol.Advised Dr.Hilty is out of office.I will send message to him for advice.

## 2020-03-03 ENCOUNTER — Ambulatory Visit: Payer: No Typology Code available for payment source

## 2020-03-03 ENCOUNTER — Other Ambulatory Visit: Payer: Self-pay

## 2020-03-03 DIAGNOSIS — G4733 Obstructive sleep apnea (adult) (pediatric): Secondary | ICD-10-CM

## 2020-03-03 DIAGNOSIS — R0683 Snoring: Secondary | ICD-10-CM

## 2020-03-08 ENCOUNTER — Telehealth: Payer: Self-pay | Admitting: Pulmonary Disease

## 2020-03-08 DIAGNOSIS — G4733 Obstructive sleep apnea (adult) (pediatric): Secondary | ICD-10-CM

## 2020-03-08 NOTE — Telephone Encounter (Signed)
Call patient  Sleep study result  Date of study: 03/07/2020  Impression: Mild obstructive sleep apnea with excessive daytime sleepiness Mild oxygen desaturations  Recommendation: CPAP therapy for mild obstructive sleep apnea with excessive daytime sleepiness  Auto titrating CPAP with a setting of 5-15 will be appropriate for treatment  Encourage weight loss efforts  Follow-up as scheduled

## 2020-03-09 NOTE — Telephone Encounter (Signed)
Patient called with results of home sleep study, DME order placed for new CPAP, follow up recall in place.

## 2020-03-14 ENCOUNTER — Ambulatory Visit: Payer: No Typology Code available for payment source | Admitting: Podiatry

## 2020-03-24 ENCOUNTER — Ambulatory Visit: Payer: Self-pay | Admitting: Internal Medicine

## 2020-03-28 MED FILL — HYDROCHLOROTHIAZIDE 12.5 MG: 12.5 | 90 days supply | Qty: 90 | Fill #1

## 2020-03-29 ENCOUNTER — Ambulatory Visit: Payer: No Typology Code available for payment source | Admitting: Internal Medicine

## 2020-03-29 ENCOUNTER — Ambulatory Visit: Payer: Self-pay | Admitting: Internal Medicine

## 2020-03-30 ENCOUNTER — Other Ambulatory Visit (INDEPENDENT_AMBULATORY_CARE_PROVIDER_SITE_OTHER): Payer: No Typology Code available for payment source

## 2020-03-30 DIAGNOSIS — R5383 Other fatigue: Secondary | ICD-10-CM

## 2020-03-30 DIAGNOSIS — D72819 Decreased white blood cell count, unspecified: Secondary | ICD-10-CM

## 2020-03-30 DIAGNOSIS — R739 Hyperglycemia, unspecified: Secondary | ICD-10-CM

## 2020-03-30 DIAGNOSIS — R300 Dysuria: Secondary | ICD-10-CM

## 2020-03-30 DIAGNOSIS — R17 Unspecified jaundice: Secondary | ICD-10-CM | POA: Diagnosis not present

## 2020-03-30 DIAGNOSIS — I10 Essential (primary) hypertension: Secondary | ICD-10-CM

## 2020-03-30 DIAGNOSIS — Z113 Encounter for screening for infections with a predominantly sexual mode of transmission: Secondary | ICD-10-CM

## 2020-03-30 LAB — CBC WITH DIFFERENTIAL/PLATELET
Basophils Absolute: 0 10*3/uL (ref 0.0–0.1)
Basophils Relative: 0.5 % (ref 0.0–3.0)
Eosinophils Absolute: 0 10*3/uL (ref 0.0–0.7)
Eosinophils Relative: 0.5 % (ref 0.0–5.0)
HCT: 39.8 % (ref 36.0–46.0)
Hemoglobin: 13.5 g/dL (ref 12.0–15.0)
Lymphocytes Relative: 39.2 % (ref 12.0–46.0)
Lymphs Abs: 2 10*3/uL (ref 0.7–4.0)
MCHC: 34 g/dL (ref 30.0–36.0)
MCV: 90.1 fl (ref 78.0–100.0)
Monocytes Absolute: 0.4 10*3/uL (ref 0.1–1.0)
Monocytes Relative: 6.7 % (ref 3.0–12.0)
Neutro Abs: 2.8 10*3/uL (ref 1.4–7.7)
Neutrophils Relative %: 53.1 % (ref 43.0–77.0)
Platelets: 270 10*3/uL (ref 150.0–400.0)
RBC: 4.42 Mil/uL (ref 3.87–5.11)
RDW: 13.9 % (ref 11.5–15.5)
WBC: 5.2 10*3/uL (ref 4.0–10.5)

## 2020-03-30 LAB — HEPATIC FUNCTION PANEL
ALT: 18 U/L (ref 0–35)
AST: 20 U/L (ref 0–37)
Albumin: 3.9 g/dL (ref 3.5–5.2)
Alkaline Phosphatase: 105 U/L (ref 39–117)
Bilirubin, Direct: 0.1 mg/dL (ref 0.0–0.3)
Total Bilirubin: 0.6 mg/dL (ref 0.2–1.2)
Total Protein: 7.3 g/dL (ref 6.0–8.3)

## 2020-03-30 LAB — CORTISOL: Cortisol, Plasma: 5.3 ug/dL

## 2020-03-30 LAB — HEMOGLOBIN A1C: Hgb A1c MFr Bld: 5.6 % (ref 4.6–6.5)

## 2020-03-30 LAB — BILIRUBIN, DIRECT: Bilirubin, Direct: 0.1 mg/dL (ref 0.0–0.3)

## 2020-03-31 ENCOUNTER — Other Ambulatory Visit (HOSPITAL_COMMUNITY)
Admission: RE | Admit: 2020-03-31 | Discharge: 2020-03-31 | Disposition: A | Discharge status: Home / Self Care | Payer: No Typology Code available for payment source | Source: Ambulatory Visit | Attending: Internal Medicine | Admitting: Internal Medicine

## 2020-03-31 DIAGNOSIS — Z113 Encounter for screening for infections with a predominantly sexual mode of transmission: Secondary | ICD-10-CM | POA: Diagnosis not present

## 2020-03-31 LAB — URINALYSIS, ROUTINE W REFLEX MICROSCOPIC
Bilirubin Urine: NEGATIVE
Ketones, ur: NEGATIVE
Leukocytes,Ua: NEGATIVE
Nitrite: NEGATIVE
Specific Gravity, Urine: >=1.03 — AB (ref 1.000–1.030)
Total Protein, Urine: NEGATIVE
Urine Glucose: NEGATIVE
Urobilinogen, UA: 0.2 (ref 0.0–1.0)
pH: 5 (ref 5.0–8.0)

## 2020-03-31 LAB — HEPATITIS C ANTIBODY
Hepatitis C Ab: NONREACTIVE
SIGNAL TO CUT-OFF: 0.13 (ref <1.00)

## 2020-03-31 LAB — RPR: RPR Ser Ql: NONREACTIVE

## 2020-03-31 LAB — HIV ANTIBODY (ROUTINE TESTING W REFLEX): HIV 1&2 Ab, 4th Generation: NONREACTIVE

## 2020-03-31 NOTE — Addendum Note (Signed)
Addended by: Jacob Moores on: 03/31/2020 12:02 PM   Modules accepted: Orders

## 2020-04-03 LAB — URINE CULTURE
MICRO NUMBER:: 10770105
SPECIMEN QUALITY:: ADEQUATE

## 2020-04-04 LAB — URINE CYTOLOGY ANCILLARY ONLY
Bacterial Vaginitis-Urine: NEGATIVE
Candida Urine: NEGATIVE
Chlamydia: NEGATIVE
Comment: NEGATIVE
Comment: NEGATIVE
Comment: NORMAL
Neisseria Gonorrhea: NEGATIVE
Trichomonas: NEGATIVE

## 2020-04-04 NOTE — Progress Notes (Signed)
I have reached out to the young lady that did her lab work. I am just waiting for a response to why her labs have not been resulted. Sorry for the inconvenience

## 2020-04-05 ENCOUNTER — Telehealth: Payer: Self-pay | Admitting: Pulmonary Disease

## 2020-04-05 NOTE — Telephone Encounter (Signed)
Called and left message for patient to return call.  

## 2020-04-05 NOTE — Telephone Encounter (Signed)
Patient is returning phone call. Patient phone number is [phone].

## 2020-04-05 NOTE — Telephone Encounter (Signed)
Spoke with patient, she states she had her sleep study about a month ago and was told she needed to start using a CPAP.  She has been having problems with her phone and is afraid she may have missed a call.  Information sent to one of the PCCs to see if [employer] has been trying to contact her regarding getting a CPAP.

## 2020-04-05 NOTE — Telephone Encounter (Signed)
lmam for [facility] with [employer]

## 2020-04-07 ENCOUNTER — Encounter: Payer: Self-pay | Admitting: Internal Medicine

## 2020-04-07 ENCOUNTER — Other Ambulatory Visit: Payer: Self-pay | Admitting: Internal Medicine

## 2020-04-07 ENCOUNTER — Telehealth (INDEPENDENT_AMBULATORY_CARE_PROVIDER_SITE_OTHER): Payer: No Typology Code available for payment source | Admitting: Internal Medicine

## 2020-04-07 VITALS — BP 115/81 | Ht 62.52 in | Wt 161.0 lb

## 2020-04-07 DIAGNOSIS — Z77018 Contact with and (suspected) exposure to other hazardous metals: Secondary | ICD-10-CM

## 2020-04-07 DIAGNOSIS — R29898 Other symptoms and signs involving the musculoskeletal system: Secondary | ICD-10-CM

## 2020-04-07 DIAGNOSIS — L661 Lichen planopilaris: Secondary | ICD-10-CM | POA: Insufficient documentation

## 2020-04-07 DIAGNOSIS — M6281 Muscle weakness (generalized): Secondary | ICD-10-CM

## 2020-04-07 DIAGNOSIS — E785 Hyperlipidemia, unspecified: Secondary | ICD-10-CM

## 2020-04-07 DIAGNOSIS — R079 Chest pain, unspecified: Secondary | ICD-10-CM

## 2020-04-07 DIAGNOSIS — I10 Essential (primary) hypertension: Secondary | ICD-10-CM

## 2020-04-07 DIAGNOSIS — R5383 Other fatigue: Secondary | ICD-10-CM

## 2020-04-07 DIAGNOSIS — R519 Headache, unspecified: Secondary | ICD-10-CM

## 2020-04-07 DIAGNOSIS — R002 Palpitations: Secondary | ICD-10-CM

## 2020-04-07 MED ORDER — TRIAMTERENE-HCTZ 37.5-25 MG PO TABS: 0.5000 | ORAL_TABLET | Freq: Every day | ORAL | 3 refills | Status: DC

## 2020-04-07 MED ORDER — CARVEDILOL 3.125 MG PO TABS: 3.1250 mg | ORAL_TABLET | Freq: Two times a day (BID) | ORAL | 3 refills | Status: DC

## 2020-04-07 MED FILL — CARVEDILOL 3.125 MG TABLET: 3.125 | 90 days supply | Qty: 180 | Fill #0

## 2020-04-07 MED FILL — TRIAMTERENE-HCTZ 37.5-25 MG: 37.5-25 | 90 days supply | Qty: 45 | Fill #0

## 2020-04-07 NOTE — Progress Notes (Signed)
Telephone Note  I connected with [name]  on 04/07/20 at  9:50 AM EDT by telephone and verified that I am speaking with the correct person using two identifiers.  Location patient: home Location provider:work or home office Persons participating in the virtual visit: patient, provider  I discussed the limitations of evaluation and management by telemedicine and the availability of in person appointments. The patient expressed understanding and agreed to proceed.   HPI: 1. Lichen planopilaris scalp per bx dermatology given Rx plaquenil not tried yet and given temovate not used  2. HLD stopped crestor 20 mg due to elevated lfts (bilirubin) and abdominal bloating  3. HTN with h/a 134/94, 130/97, 121/92 03/26/20  On hctz 12.5 mg qd, coreg 3.125 mg bid  ROS: See pertinent positives and negatives per HPI.  Past Medical History:  Diagnosis Date  . Anemia   . Anxiety   . ASCUS favoring benign 12/2015   negative high risk HPV  . Chicken pox   . COVID-19 december  . GERD (gastroesophageal reflux disease)    during pregnancy   . 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 Hyperlipidemia   . Hypertension    during pregnancy   . 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 Lichen planopilaris    scalp  . Reflux     Past Surgical History:  Procedure Laterality Date  . COLPOSCOPY    . LAPAROSCOPIC VAGINAL HYSTERECTOMY WITH SALPINGO OOPHORECTOMY Bilateral 08/10/2018   Procedure: LAPAROSCOPIC ASSISTED VAGINAL HYSTERECTOMY WITH BILATERAL SALPINGO OOPHORECTOMY;  Surgeon: [name], MD;  Location: [address];  Service: Gynecology;  Laterality: Bilateral;  . TONSILLECTOMY AND ADENOIDECTOMY      Family History  Problem Relation Age of Onset  . Hypertension Mother   . Cancer Mother        LUNG CANCER/SMOKER  . Depression Mother   . Mental illness Mother   . Diabetes Father   . Hypertension Father   . Heart disease Father   .  Hyperlipidemia Father   . Stroke Father   . Hypertension Brother   . Diabetes Brother   . Depression Brother   . Hyperlipidemia Brother   . Cancer Maternal Uncle        ESOPHAGEAL CA  . Diabetes Cousin   . Heart disease Cousin   . Cancer Maternal Grandfather        Brain  . Stroke Paternal Grandmother   . Depression Maternal Grandmother   . Mental retardation Maternal Grandmother   . 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  albuterol (VENTOLIN HFA) 108 (90 Base) MCG/ACT inhaler, Inhale 1-2 puffs into the lungs every 6 (six) hours as needed for wheezing or shortness of breath., Disp: 18 g, Rfl: 2 .  aspirin EC 81 MG tablet, Take 81 mg by mouth daily. Swallow whole., Disp: , Rfl:  .  carvedilol (COREG) 3.125 MG tablet, Take 1 tablet (3.125 mg total) by mouth 2 (two) times daily with a meal., Disp: 180 tablet, Rfl: 3 .  cyclobenzaprine (FLEXERIL) 5 MG tablet, Take 1 tablet (5 mg total) by mouth at bedtime as needed for muscle spasms., Disp: 30 tablet, Rfl: 2 .  hydrochlorothiazide (HYDRODIURIL) 12.5 MG tablet, Take 0.5 tablets (6.25 mg total)  by mouth daily. In am if BP still >130/>80 take 12.5 mg daily, Disp: 90 tablet, Rfl: 3 .  meloxicam (MOBIC) 7.5 MG tablet, Take 1 tablet (7.5 mg total) by mouth daily., Disp: 10 tablet, Rfl: 0 .  VITAMIN D PO, Take 5,000 Units by mouth daily. , Disp: , Rfl:  .  clobetasol (TEMOVATE) 0.05 % external solution, SMARTSIG:1 Milliliter(s) Topical Daily (Patient not taking: Reported on 04/07/2020), Disp: , Rfl:  .  hydroxychloroquine (PLAQUENIL) 200 MG tablet, Take 200 mg by mouth 2 (two) times daily. (Patient not taking: Reported on 04/07/2020), Disp: , Rfl:   EXAM:  VITALS per patient if applicable:  GENERAL: alert, oriented, appears well and in no acute distress  PSYCH/NEURO: pleasant and  cooperative, no obvious depression or anxiety, speech and thought processing grossly intact  ASSESSMENT AND PLAN:  Discussed the following assessment and plan:  Essential hypertension rec stop hctz 12.5 alone and try maxzide 37.5-25 mg qd 1/2 dose  Dash diet   Lichen planopilaris F/u derm has not started plaquenil has topical steroids   Hyperlipidemia, unspecified hyperlipidemia type Stopped crestor 20 mg due to side effects  F/u Dr.Hilty 05/2020   Nonintractable headache, unspecified chronicity pattern, unspecified headache type On days when BP elevated could have forgotten dose  Make sure not missing doses of  Medication  Exposure heavy metals  -ordered test   HM-CPE at next visit in person Flu shot had10/21/20 Tdap per pt had 02/2016 need to confirm atf/u Consider covid 19 vaccinestill undecided H/o rabies vaccine   DEXA 03/09/18 normal  Pap 02/13/18 negative s/p hysterectomy 08/2018 fibroids and benign right ovarian cyst  -h/o abnormal pap LMP 10/19/17 Dr. 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 She c/o change in bowels (firm and loose stoles), ab bloating, constipation  Skin derm established   mammo2/25/21 normal  Of note pt declined from [facility], [facility] neurology (I.e muscle weakness in legs pt ?s exposure to heavy metals as of 04/07/20 ) for referrals and Derm Dr. Delma Officer office in [place] she is to call back when finds who will take her as [facility] these [facility]   Will get  records eye [place] Dr. Melissa Noon per pt seeing allergies/inflammation given steroid eye drops for pressure in left eye and thick film in eyes -she will message me who is covered as far as allergist   -we discussed possible serious and likely etiologies, options for evaluation and workup, limitations of telemedicine visit vs in person visit, treatment, treatment risks and precautions. Pt prefers to treat via telemedicine empirically rather then risking or undertaking an in person visit at this moment. Patient agrees to seek prompt in person care if worsening, new symptoms arise, or if is not improving with treatment.   I discussed the assessment and treatment plan with the patient. The patient was provided an opportunity to ask questions and all were answered. The patient agreed with the plan and demonstrated an understanding of the instructions.   The patient was advised to call back or seek an in-person evaluation if the  symptoms worsen or if the condition fails to improve as anticipated.  Time spent [place], MD

## 2020-04-07 NOTE — Telephone Encounter (Signed)
Sent community message to [place] with [employer] for update. Will wait to hear back.

## 2020-04-07 NOTE — Telephone Encounter (Signed)
Message received for [name] at [employer] per pt CPAP setup.     Good Morning! Per note on the account, We spoke to the patient 04/06/20 and Advised we were waiting for Authorization from her insurance. Once her insurance approves we will set up a time for setup.  Thank you!

## 2020-04-17 ENCOUNTER — Other Ambulatory Visit (HOSPITAL_COMMUNITY): Payer: No Typology Code available for payment source

## 2020-05-12 ENCOUNTER — Telehealth (INDEPENDENT_AMBULATORY_CARE_PROVIDER_SITE_OTHER): Payer: No Typology Code available for payment source | Admitting: Internal Medicine

## 2020-05-12 ENCOUNTER — Other Ambulatory Visit: Payer: Self-pay | Admitting: Internal Medicine

## 2020-05-12 ENCOUNTER — Encounter: Payer: Self-pay | Admitting: Internal Medicine

## 2020-05-12 DIAGNOSIS — E785 Hyperlipidemia, unspecified: Secondary | ICD-10-CM

## 2020-05-12 DIAGNOSIS — R17 Unspecified jaundice: Secondary | ICD-10-CM | POA: Diagnosis not present

## 2020-05-12 MED ORDER — ROSUVASTATIN CALCIUM 20 MG PO TABS
20.0000 mg | ORAL_TABLET | Freq: Every day | ORAL | 6 refills | Status: DC
Start: 2020-05-12 — End: 2020-05-12

## 2020-05-12 MED FILL — ROSUVASTATIN CALCIUM 20 MG: 20 | 30 days supply | Qty: 30 | Fill #0

## 2020-05-12 NOTE — Patient Instructions (Addendum)
Medication Instructions:  Your physician has recommended you make the following change in your medication:  -- RESTART Crestor (rosuvastatin) 20 mg - Take 1 tablet by mouth daily -- New RX sent  *If you need a refill on your cardiac medications before your next appointment, please call your pharmacy*  Lab Work: Your physician recommends that you return for lab work in: 2 [place] Bilirubin and a CMET (9/27) Your physician recommends that you return for lab work in: 3 MONTHS - Fasting Lipid Panel (preferably before follow up appt) (12/13)   If you have labs (blood work) drawn today and your tests are completely normal, you will receive your results only by: Marland Kitchen MyChart Message (if you have MyChart) OR . A paper copy in the mail If you have any lab test that is abnormal or we need to change your treatment, we will call you to review the results.  Follow-Up: At [facility], you and your health needs are our priority.  As part of our continuing mission to provide you with exceptional heart care, we have created designated Provider Care Teams.  These Care Teams include your primary Cardiologist (physician) and Advanced Practice Providers (APPs -  Physician Assistants and Nurse Practitioners) who all work together to provide you with the care you need, when you need it.  We recommend signing up for the patient portal called "MyChart".  Sign up information is provided on this After Visit Summary.  MyChart is used to connect with patients for Virtual Visits (Telemedicine).  Patients are able to view lab/test results, encounter notes, upcoming appointments, etc.  Non-urgent messages can be sent to your provider as well.   To learn more about what you can do with MyChart, go to [email or web address].    Your next appointment:   Your physician recommends that you schedule a follow-up appointment in 3 MONTHS with Dr. Debara Pickett. -- 12/20/21at 8:00 am (virtual)   The format for your next  appointment:   Virtual Visit (phone call)  with K. Mali Hilty, MD

## 2020-05-12 NOTE — Addendum Note (Signed)
Addended by: Campbell Riches on: 05/12/2020 10:23 AM   Modules accepted: Orders

## 2020-05-12 NOTE — Progress Notes (Signed)
Virtual Visit via Video Note   This visit type was conducted due to national recommendations for restrictions regarding the COVID-19 Pandemic (e.g. social distancing) in an effort to limit this patient's exposure and mitigate transmission in our community.  Due to her co-morbid illnesses, this patient is at least at moderate risk for complications without adequate follow up.  This format is felt to be most appropriate for this patient at this time.  All issues noted in this document were discussed and addressed.  A limited physical exam was performed with this format.  Please refer to the patient's chart for her consent to telehealth for [facility].   Evaluation Performed: follow-up  Date:  05/12/2020   ID:  [name], DOB 10/19/1970, MRN [id number]  Patient Location:  [address] [place] [address]  Provider location:   [address], [address] [place], [place] [address]  PCP:  McLean-Scocuzza, Nino Glow, MD  Cardiologist:  Pixie Casino, MD Electrophysiologist:  None   Chief Complaint:  Follow-up chest pain  History of Present Illness:    [name] is a 49 y.o. female who presents via audio/video conferencing for a telehealth visit today.  [name] is seen today in follow-up. I had last seen her in 2019 for leg swelling - recently she had chest pain - more upper back and shoulder blade, constant, worse with deep breathing. She was seen in the ER and had a negative CT for PE. Subsequently, she saw Almyra Deforest, PA-C in the office. He ordered a CT coronary angiogram which was performed on 11/17/2018 - this demonstrated 0 (zero) coronary calcium, however, there was a small amount of non-calcified plaque in the LAD (10-20%) - aggressive risk factor modification was recommended. She had recent labs, including a lipid profile which showed an LDL of 223, HDL of 68 and trigs of 140. I reviewed these results with her today and my recommendations for aggressive dietary  intervention and possible lipid lowering therapy to target LDL of <70.  05/12/2020  Ms. [name] returns today for follow-up.  This is a virtual visit.  She actually came to the office thinking she had an office appointment.  We did perform a telephone visit.  This is follow-up because she was noted to have isolated elevation in her bilirubin which was 1.5.  Unfortunately this was not fractionated.  Her PCP advised her to discontinue her statin.  Her liver enzymes were normal.  Subsequently a repeat metabolic profile with a direct bilirubin were performed showing a normal direct bilirubin of 0.1 and again normal liver enzymes 3 months later.  Prior to that she had excellent improvement in her lipids close to her target.  She otherwise did seem to tolerate her rosuvastatin and again had no elevation in liver enzymes.  She also had had Covid this past year.  During this time when her bilirubin was elevated she reported some abdominal bloating, changes in her urine and some intermittent right upper quadrant tenderness suggestive of possible biliary cholestasis.  The patient does not have symptoms concerning for COVID-19 infection (fever, chills, cough, or new SHORTNESS OF BREATH).    Prior CV studies:   The following studies were reviewed today:  CT coronary angiogram Recent lipid profile  PMHx:  Past Medical History:  Diagnosis Date  . Anemia   . Anxiety   . ASCUS favoring benign 12/2015   negative high risk HPV  . Chicken pox   . COVID-19 december  .  GERD (gastroesophageal reflux disease)    during pregnancy   . 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 Hyperlipidemia   . Hypertension    during pregnancy   . 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 Lichen planopilaris    scalp  . Reflux     Past Surgical History:  Procedure Laterality Date  . COLPOSCOPY    . LAPAROSCOPIC VAGINAL HYSTERECTOMY WITH SALPINGO OOPHORECTOMY Bilateral 08/10/2018   Procedure:  LAPAROSCOPIC ASSISTED VAGINAL HYSTERECTOMY WITH BILATERAL SALPINGO OOPHORECTOMY;  Surgeon: [name], MD;  Location: [facility];  Service: Gynecology;  Laterality: Bilateral;  . TONSILLECTOMY AND ADENOIDECTOMY      FAMHx:  Family History  Problem Relation Age of Onset  . Hypertension Mother   . Cancer Mother        LUNG CANCER/SMOKER  . Depression Mother   . Mental illness Mother   . Diabetes Father   . Hypertension Father   . Heart disease Father   . Hyperlipidemia Father   . Stroke Father   . Hypertension Brother   . Diabetes Brother   . Depression Brother   . Hyperlipidemia Brother   . Cancer Maternal Uncle        ESOPHAGEAL CA  . Diabetes Cousin   . Heart disease Cousin   . Cancer Maternal Grandfather        Brain  . Stroke Paternal Grandmother   . Depression Maternal Grandmother   . Mental retardation Maternal Grandmother   . Stroke Paternal Grandfather     SOCHx:   reports that she quit smoking about 29 years ago. Her smoking use included cigarettes. She has never used smokeless tobacco. She reports previous alcohol use. She reports that she does not use drugs.  ALLERGIES:  Allergies  Allergen Reactions  . Codeine Shortness Of Breath    Breathing problems  - liquid med   . Crestor [Rosuvastatin]     Abdominal bloating elevated lfts    . Erythromycin     GI upset   . Levofloxacin     Heart racing     MEDS:  No outpatient medications have been marked as taking for the 05/12/20 encounter (Appointment) with Pixie Casino, MD.     ROS: Pertinent items noted in HPI and remainder of comprehensive ROS otherwise negative.  Labs/Other Tests and Data Reviewed:    Recent Labs: 12/09/2019: BUN 19; Creatinine, Ser 0.69; Potassium 3.8; Sodium 137; TSH 1.90 03/30/2020: ALT 18; Hemoglobin 13.5; Platelets 270.0   Recent Lipid Panel Lab Results  Component Value Date/Time   CHOL 155 12/09/2019 09:31 AM   CHOL 221 (H) 07/06/2019 10:04 AM    TRIG 91.0 12/09/2019 09:31 AM   HDL 57.40 12/09/2019 09:31 AM   HDL 61 07/06/2019 10:04 AM   CHOLHDL 3 12/09/2019 09:31 AM   LDLCALC 80 12/09/2019 09:31 AM   LDLCALC 144 (H) 07/06/2019 10:04 AM    Wt Readings from Last 3 Encounters:  04/07/20 161 lb (73 kg)  02/17/20 174 lb 3.2 oz (79 kg)  01/17/20 175 lb (79.4 kg)     Exam:    Vital Signs:  LMP 08/02/2018 (Exact Date)    General appearance: alert and no distress Lungs: no visible difficulty breathing Extremities: extremities normal, atraumatic, no cyanosis or edema Skin: Skin color, texture, turgor normal. No rashes or lesions Neurologic: Mental status: Alert, oriented, thought content appropriate Psych: Pleasant, mildly anxious  ASSESSMENT & PLAN:  1. Elevated bilirubin-?  Biliary cholestasis 2. Pleuritic chest and upper back pain 3. Low risk cardiac CT with CAC of 0, mild non-calcified plaque of the LAD 4. Dyslipidemia 5. Hypertension  Ms. Gilkes had an isolated elevation in her bilirubin however fractionated bilirubin was not performed at that time.  Several months later her bilirubin was normal.  No changes in liver enzymes were noted.  She did have some other symptoms including abdominal bloating, changes in her urine and some right upper quadrant discomfort suggestive of possible cholestasis or perhaps cholecystitis.  She reports when she was a teenager having had what sounds like an upper GI series that suggested she might have gallstones.  This may need further work-up.  I think it is unlikely that this is statin related given that her liver enzymes were normal.  I would recommend she restart her rosuvastatin and will repeat a metabolic profile with fractionated bilirubin in 2 weeks.  Subsequently recheck her lipid profile and follow-up with me in 3 months.  COVID-19 Education: The signs and symptoms of COVID-19 were discussed with the patient and how to seek care for testing (follow up with PCP or arrange E-visit).   The importance of social distancing was discussed today.  Patient Risk:   After full review of this patients clinical status, I feel that they are at least moderate risk at this time.  Time:   Today, I have spent 25 minutes with the patient with telehealth technology discussing her CT results, medications, cholesterol profile, healthy diet and exercise changes, aspirin use, COVID-19 preventative measures     Medication Adjustments/Labs and Tests Ordered: Current medicines are reviewed at length with the patient today.  Concerns regarding medicines are outlined above.   Tests Ordered: No orders of the defined types were placed in this encounter.   Medication Changes: No orders of the defined types were placed in this encounter.   Disposition:  in 3 month(s) in lipid clinic  Pixie Casino, MD, [facility], [place] Director of the Advanced Lipid Disorders &  Cardiovascular Risk Reduction Clinic Diplomate of the American Board of Clinical Lipidology Attending Cardiologist  Direct Dial: [phone]  Fax: [phone]  Website:  [email or web address]  Pixie Casino, MD  05/12/2020 8:14 AM

## 2020-05-30 ENCOUNTER — Encounter: Payer: Self-pay | Admitting: Internal Medicine

## 2020-05-31 ENCOUNTER — Telehealth: Payer: Self-pay | Admitting: Internal Medicine

## 2020-05-31 NOTE — Telephone Encounter (Signed)
Encounter changed to a telephone call and sent to Dr Olivia Mackie McLean-Scocuzza

## 2020-05-31 NOTE — Telephone Encounter (Signed)
Please call pt back to discuss

## 2020-05-31 NOTE — Telephone Encounter (Signed)
Renaldo Fiddler L routed conversation to You 3 hours ago (12:05 PM)  Renaldo Fiddler L 3 hours ago (12:05 PM)  Clarissa   Please call pt back to discuss      Documentation   You routed conversation to McLean-Scocuzza, Nino Glow, MD 21 hours ago (5:19 PM)  Toppin, Cephus Shelling  McLean-Scocuzza, Nino Glow, MD Yesterday (2:17 PM)   I had the first dose of Phizer Covid 19 vaccine on Friday, May 12, 2020.  The next week on Wednesday, Thursday I started having prickly feeling in my feet, like pins and needles. I've been hoping it was something temporary but I'm still having issues. My feet have burning sensation and I still experience the prickly feelings. I've also had some numbness in my hands at times.  I am supposed to receive the second dose of Covid-19 vaccine this Friday, June 02, 2020 to prevent losing my job with [employer]. I do not believe vaccines are in my best interest at this time not know what's going on.  The flu vaccine is also coming up as well.  I do not want to lose my job.... I can't lose my job, I am a single mom.  I absolutely do not see vaccines to be in my best interest. I feel they are against my health by what I am experiencing. I do not know what's going on and I am reaching out to you for your help to have me exempt from vaccines right now.  I also have the muscle weakness in my legs while going up steps.  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  Thank you for your help. I'm sure you'll need me to make an appointment with you to address this matter.  I can't lose my health insurance as well.  Thank you, Juluis Mire

## 2020-06-05 NOTE — Telephone Encounter (Signed)
It is very rare pfizer causes Guillian Barre syndrome but if you are having any symptoms from the vaccine please let know  Your employer Location where you had the covid vaccine  Call employee health 4. If your symptoms continue please go to the Emergency Room or you will need to see Neurology so let me know who you would like to see as we've previously tried to send neurology referrals to [facility] neurology and [place] [facility] and both offices have declined to see you for some reason

## 2020-06-05 NOTE — Telephone Encounter (Signed)
I called and spoke with the patient and she stated she was exempt from the FLU and the second covid vaccine from health at work.  She will drop the form off in the morning for you.  She is calling neurology to see why she can't have an appointment. Delmo Matty,cma

## 2020-06-07 NOTE — Telephone Encounter (Signed)
Patient called in with  The neurologist name and location Dr.Lirim Tonuzi [facility] [address] [place] stated that it need to be put in as urgent.

## 2020-06-08 NOTE — Telephone Encounter (Signed)
For your information  

## 2020-06-14 ENCOUNTER — Telehealth: Payer: Self-pay | Admitting: Internal Medicine

## 2020-06-14 NOTE — Telephone Encounter (Signed)
In work Engineer, production and filled out call pt

## 2020-06-14 NOTE — Telephone Encounter (Signed)
Pt called wanting to see if we had received a form from [employer] Work about the covid and flu vaccine  Please call Pt at [phone]

## 2020-06-14 NOTE — Telephone Encounter (Signed)
Please advise, have you seen this?

## 2020-06-16 NOTE — Addendum Note (Signed)
Addended by: Thressa Sheller on: 06/16/2020 04:20 PM   Modules accepted: Orders

## 2020-06-16 NOTE — Telephone Encounter (Signed)
Patient calling back in to check on status of neurology referral. This has been ordered and pended.  Patient was informed bu neurology office to have referral ordered as urgent.

## 2020-06-16 NOTE — Telephone Encounter (Signed)
Spoke with Patient. She wanted to inquire about referral on previous encounter. This encounter was addended and sent to [facility] McLean-Scocuzza

## 2020-06-16 NOTE — Telephone Encounter (Signed)
Pt said she has another question she needs to ask you and would like a call back.

## 2020-06-16 NOTE — Telephone Encounter (Signed)
Called and informed the Patient this is ready. Have scanned this to her e-mail via our copy machine.

## 2020-06-19 NOTE — Addendum Note (Signed)
Addended by: Orland Mustard on: 06/19/2020 09:30 AM   Modules accepted: Orders

## 2020-06-19 NOTE — Telephone Encounter (Signed)
Referral placed.

## 2020-07-14 ENCOUNTER — Encounter: Payer: Self-pay | Admitting: Internal Medicine

## 2020-07-14 ENCOUNTER — Telehealth (INDEPENDENT_AMBULATORY_CARE_PROVIDER_SITE_OTHER): Payer: No Typology Code available for payment source | Admitting: Internal Medicine

## 2020-07-14 ENCOUNTER — Other Ambulatory Visit: Payer: Self-pay | Admitting: Internal Medicine

## 2020-07-14 VITALS — Ht 62.52 in | Wt 174.0 lb

## 2020-07-14 DIAGNOSIS — Z77018 Contact with and (suspected) exposure to other hazardous metals: Secondary | ICD-10-CM

## 2020-07-14 DIAGNOSIS — K449 Diaphragmatic hernia without obstruction or gangrene: Secondary | ICD-10-CM

## 2020-07-14 DIAGNOSIS — R202 Paresthesia of skin: Secondary | ICD-10-CM | POA: Diagnosis not present

## 2020-07-14 DIAGNOSIS — E669 Obesity, unspecified: Secondary | ICD-10-CM

## 2020-07-14 DIAGNOSIS — E785 Hyperlipidemia, unspecified: Secondary | ICD-10-CM | POA: Diagnosis not present

## 2020-07-14 DIAGNOSIS — M62838 Other muscle spasm: Secondary | ICD-10-CM | POA: Diagnosis not present

## 2020-07-14 DIAGNOSIS — Z1211 Encounter for screening for malignant neoplasm of colon: Secondary | ICD-10-CM

## 2020-07-14 DIAGNOSIS — K219 Gastro-esophageal reflux disease without esophagitis: Secondary | ICD-10-CM | POA: Diagnosis not present

## 2020-07-14 MED ORDER — FAMOTIDINE 20 MG PO TABS: 20.0000 mg | ORAL_TABLET | Freq: Two times a day (BID) | ORAL | 11 refills | Status: DC

## 2020-07-14 MED ORDER — CYCLOBENZAPRINE HCL 5 MG PO TABS: 5.0000 mg | ORAL_TABLET | Freq: Every evening | ORAL | 1 refills | Status: DC | PRN

## 2020-07-14 MED FILL — CYCLOBENZAPRINE HCL 5 MG TA: 5 | 90 days supply | Qty: 90 | Fill #0

## 2020-07-14 MED FILL — FAMOTIDINE 20 MG TABS: 20 | 30 days supply | Qty: 60 | Fill #0

## 2020-07-14 NOTE — Progress Notes (Signed)
Virtual Visit via Video Note  I connected with [name]  on 07/14/20 at  4:30 PM EST by a video enabled telemedicine application and verified that I am speaking with the correct person using two identifiers.  Location patient: home, [place] Location provider:work or home office Persons participating in the virtual visit: patient, provider  I discussed the limitations of evaluation and management by telemedicine and the availability of in person appointments. The patient expressed understanding and agreed to proceed.   HPI: F/u  1. C/o MSK pain left rib (upper rib cage left) area stabbing at times b/l and feels like a knot since Tuesday. She has had this pain before and pain has been worse since w/in the last 2-3 weeks since coughing. Pain feels like muscle spasm nothing tried and could not move  EXAM: THORACIC SPINE - 3 VIEWS  COMPARISON:  Chest radiograph November 10, 2018  FINDINGS: Frontal, lateral, and swimmer's views obtained. No fracture or spondylolisthesis. There is mild disc space narrowing at several levels. No erosive change or paraspinous lesion. Visualized lung regions clear.  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 No fracture or spondylolisthesis.   Electronically Signed   By: Lowella Grip III M.D.   On: 04/12/2019 09:03 2. Pins and needles feet since getting the covid 19 vaccine pending f/u with neurology has not heard re referral 3. She wants to be screening for heavy metals  4. H/o small hiatal hernia having GERD sxs with ketchup, tartar sauce and fried food and at times n/v. She is also ready to due screening colonoscopy and wants referral to GI    ROS: See pertinent positives and negatives per HPI.  Past Medical History:  Diagnosis Date  . Anemia   . Anxiety   . ASCUS favoring benign 12/2015   negative high risk HPV  . Chicken pox   . COVID-19 december  . GERD (gastroesophageal reflux disease)    during  pregnancy   . 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 Hyperlipidemia   . Hypertension    during pregnancy   . 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 Lichen planopilaris    scalp  . Reflux     Past Surgical History:  Procedure Laterality Date  . COLPOSCOPY    . LAPAROSCOPIC VAGINAL HYSTERECTOMY WITH SALPINGO OOPHORECTOMY Bilateral 08/10/2018   Procedure: LAPAROSCOPIC ASSISTED VAGINAL HYSTERECTOMY WITH BILATERAL SALPINGO OOPHORECTOMY;  Surgeon: [name], MD;  Location: [facility];  Service: Gynecology;  Laterality: Bilateral;  . TONSILLECTOMY AND ADENOIDECTOMY       Current Outpatient Medications:  .  carvedilol (COREG) 3.125 MG tablet, Take 1 tablet (3.125 mg total) by mouth 2 (two) times daily with a meal., Disp: 180 tablet, Rfl: 3 .  triamterene-hydrochlorothiazide (MAXZIDE-25) 37.5-25 MG tablet, Take 0.5 tablets by mouth daily. In am. Cut pill in 1/2 for patient. Stop hctz 12.5 alone dose, Disp: 45 tablet, Rfl: 3 .  aspirin EC 81 MG tablet, Take 81 mg by mouth daily. Swallow whole. (Patient not taking: Reported on 07/14/2020), Disp: , Rfl:  .  clobetasol (TEMOVATE) 0.05 % external solution, SMARTSIG:1 Milliliter(s) Topical Daily (Patient not taking: Reported on 04/07/2020), Disp: , Rfl:  .  cyclobenzaprine (FLEXERIL) 5 MG tablet, Take 1 tablet (5 mg total) by mouth at bedtime as needed for muscle spasms., Disp: 90 tablet, Rfl: 1 .  famotidine (PEPCID) 20 MG tablet, Take 1 tablet (20  mg total) by mouth 2 (two) times daily. Prn reflux, Disp: 60 tablet, Rfl: 11 .  hydroxychloroquine (PLAQUENIL) 200 MG tablet, Take 200 mg by mouth 2 (two) times daily. (Patient not taking: Reported on 04/07/2020), Disp: , Rfl:  .  rosuvastatin (CRESTOR) 20 MG tablet, Take 1 tablet (20 mg total) by mouth daily. (Patient not taking: Reported on 07/14/2020), Disp: 30 tablet, Rfl: 6 .  VITAMIN D PO, Take 5,000 Units by mouth daily.  (Patient not taking:  Reported on 07/14/2020), Disp: , Rfl:   EXAM:  VITALS per patient if applicable:  GENERAL: alert, oriented, appears well and in no acute distress  HEENT: atraumatic, conjunttiva clear, no obvious abnormalities on inspection of external nose and ears  NECK: normal movements of the head and neck  LUNGS: on inspection no signs of respiratory distress, breathing rate appears normal, no obvious gross SOB, gasping or wheezing  CV: no obvious cyanosis  MS: moves all visible extremities without noticeable abnormality  PSYCH/NEURO: pleasant and cooperative, no obvious depression or anxiety, speech and thought processing grossly intact  ASSESSMENT AND PLAN:  Discussed the following assessment and plan:  Gastroesophageal reflux disease without esophagitis - Plan: famotidine (PEPCID) 20 MG tablet Try ppi if this does not work   Muscle spasm - Plan: cyclobenzaprine (FLEXERIL) 5 MG tablet Given back exercises   Hyperlipidemia, unspecified hyperlipidemia type - Plan: Comprehensive metabolic panel, Lipid panel  Paresthesia - Plan: Comprehensive metabolic panel, Heavy Metals, Blood Pending appt neurology   Heavy metal exposure - Plan: Comprehensive metabolic panel  Encounter for screening colonoscopy - Plan: Ambulatory referral to Gastroenterology Hiatal hernia  GERD  Obesity (BMI 30-39.9) rec healthy diet and exercise   HM-CPE at next visit in person Flu shot had10/21/20 Tdap per pt had 02/2016 need to confirm atf/u 1/2 covid vaccine and had reaction declines further for now H/o rabies vaccine  DEXA 03/09/18 normal  Pap 02/13/18 negative s/p hysterectomy 08/2018 fibroids and benign right ovarian cyst  -h/o abnormal pap LMP 10/19/17 Dr. 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 referral  She c/o change in bowels (firm and loose stoles), ab bloating, constipation, GERD    Skin derm established   mammo2/25/21 normal  Of note pt declined from [facility], [facility] neurology (I.e muscle weakness in legs pt ?s exposure to heavy metals as of 04/07/20 ) for referrals and Derm Dr. Delma Officer office in [place] she is to call back when finds who will take her as [facility]    eye MD [place] Dr. Melissa Noon per pt seeing allergies/inflammation given steroid eye drops for pressure in left eye and thick film in eyes -she will message me who is covered as far as allergist   -we discussed possible serious and likely etiologies, options for evaluation and workup, limitations of telemedicine visit vs in person visit, treatment, treatment risks and precautions.  I discussed the assessment and treatment plan with the patient. The patient was provided an opportunity to ask questions and all were answered. The patient agreed with the plan and demonstrated an understanding of the instructions.  Time spent 30 minutes Delorise Jackson, MD

## 2020-07-14 NOTE — Patient Instructions (Signed)
Hiatal Hernia  A hiatal hernia occurs when part of the stomach slides above the muscle that separates the abdomen from the chest (diaphragm). A person can be born with a hiatal hernia (congenital), or it may develop over time. In almost all cases of hiatal hernia, only the top part of the stomach pushes through the diaphragm. Many people have a hiatal hernia with no symptoms. The larger the hernia, the more likely it is that you will have symptoms. In some cases, a hiatal hernia allows stomach acid to flow back into the tube that carries food from your mouth to your stomach (esophagus). This may cause heartburn symptoms. Severe heartburn symptoms may mean that you have developed a condition called gastroesophageal reflux disease (GERD). What are the causes? This condition is caused by a weakness in the opening (hiatus) where the esophagus passes through the diaphragm to attach to the upper part of the stomach. A person may be born with a weakness in the hiatus, or a weakness can develop over time. What increases the risk? This condition is more likely to develop in:  Older people. Age is a major risk factor for a hiatal hernia, especially if you are over the age of 50.  Pregnant women.  People who are overweight.  People who have frequent constipation. What are the signs or symptoms? Symptoms of this condition usually develop in the form of GERD symptoms. Symptoms include:  Heartburn.  Belching.  Indigestion.  Trouble swallowing.  Coughing or wheezing.  Sore throat.  Hoarseness.  Chest pain.  Nausea and vomiting. How is this diagnosed? This condition may be diagnosed during testing for GERD. Tests that may be done include:  X-rays of your stomach or chest.  An upper gastrointestinal (GI) series. This is an X-ray exam of your GI tract that is taken after you swallow a chalky liquid that shows up clearly on the X-ray.  Endoscopy. This is a procedure to look into your stomach  using a thin, flexible tube that has a tiny camera and light on the end of it. How is this treated? This condition may be treated by:  Dietary and lifestyle changes to help reduce GERD symptoms.  Medicines. These may include: ? Over-the-counter antacids. ? Medicines that make your stomach empty more quickly. ? Medicines that block the production of stomach acid (H2 blockers). ? Stronger medicines to reduce stomach acid (proton pump inhibitors).  Surgery to repair the hernia, if other treatments are not helping. If you have no symptoms, you may not need treatment. Follow these instructions at home: Lifestyle and activity  Do not use any products that contain nicotine or tobacco, such as cigarettes and e-cigarettes. If you need help quitting, ask your health care provider.  Try to achieve and maintain a healthy body weight.  Avoid putting pressure on your abdomen. Anything that puts pressure on your abdomen increases the amount of acid that may be pushed up into your esophagus. ? Avoid bending over, especially after eating. ? Raise the head of your bed by putting blocks under the legs. This keeps your head and esophagus higher than your stomach. ? Do not wear tight clothing around your chest or stomach. ? Try not to strain when having a bowel movement, when urinating, or when lifting heavy objects. Eating and drinking  Avoid foods that can worsen GERD symptoms. These may include: ? Fatty foods, like fried foods. ? Citrus fruits, like oranges or lemon. ? Other foods and drinks that contain acid, like   orange juice or tomatoes. ? Spicy food. ? Chocolate.  Eat frequent small meals instead of three large meals a day. This helps prevent your stomach from getting too full. ? Eat slowly. ? Do not lie down right after eating. ? Do not eat 1-2 hours before bed.  Do not drink beverages with caffeine. These include cola, coffee, cocoa, and tea.  Do not drink alcohol. General  instructions  Take over-the-counter and prescription medicines only as told by your health care provider.  Keep all follow-up visits as told by your health care provider. This is important. Contact a health care provider if:  Your symptoms are not controlled with medicines or lifestyle changes.  You are having trouble swallowing.  You have coughing or wheezing that will not go away. Get help right away if:  Your pain is getting worse.  Your pain spreads to your arms, neck, jaw, teeth, or back.  You have shortness of breath.  You sweat for no reason.  You feel sick to your stomach (nauseous) or you vomit.  You vomit blood.  You have bright red blood in your stools.  You have black, tarry stools. This information is not intended to replace advice given to you by your health care provider. Make sure you discuss any questions you have with your health care provider. Document Revised: 08/01/2017 Document Reviewed: 03/24/2017 Elsevier Patient Education  2020 [employer] for Gastroesophageal Reflux Disease, Adult When you have gastroesophageal reflux disease (GERD), the foods you eat and your eating habits are very important. Choosing the right foods can help ease the discomfort of GERD. Consider working with a diet and nutrition specialist (dietitian) to help you make healthy food choices. What general guidelines should I follow?  Eating plan  Choose healthy foods low in fat, such as fruits, vegetables, whole grains, low-fat dairy products, and lean meat, fish, and poultry.  Eat frequent, small meals instead of three large meals each day. Eat your meals slowly, in a relaxed setting. Avoid bending over or lying down until 2-3 hours after eating.  Limit high-fat foods such as fatty meats or fried foods.  Limit your intake of oils, butter, and shortening to less than 8 teaspoons each day.  Avoid the following: ? Foods that cause symptoms. These may be different  for different people. Keep a food diary to keep track of foods that cause symptoms. ? Alcohol. ? Drinking large amounts of liquid with meals. ? Eating meals during the 2-3 hours before bed.  Cook foods using methods other than frying. This may include baking, grilling, or broiling. Lifestyle  Maintain a healthy weight. Ask your health care provider what weight is healthy for you. If you need to lose weight, work with your health care provider to do so safely.  Exercise for at least 30 minutes on 5 or more days each week, or as told by your health care provider.  Avoid wearing clothes that fit tightly around your waist and chest.  Do not use any products that contain nicotine or tobacco, such as cigarettes and e-cigarettes. If you need help quitting, ask your health care provider.  Sleep with the head of your bed raised. Use a wedge under the mattress or blocks under the bed frame to raise the head of the bed. What foods are not recommended? The items listed may not be a complete list. Talk with your dietitian about what dietary choices are best for you. Grains Pastries or quick breads with added  fat. [place] toast. Vegetables Deep fried vegetables. [place] fries. Any vegetables prepared with added fat. Any vegetables that cause symptoms. For some people this may include tomatoes and tomato products, chili peppers, onions and garlic, and horseradish. Fruits Any fruits prepared with added fat. Any fruits that cause symptoms. For some people this may include citrus fruits, such as oranges, grapefruit, pineapple, and lemons. Meats and other protein foods High-fat meats, such as fatty beef or pork, hot dogs, ribs, ham, sausage, salami and bacon. Fried meat or protein, including fried fish and fried chicken. Nuts and nut butters. Dairy Whole milk and chocolate milk. Sour cream. Cream. Ice cream. Cream cheese. Milk shakes. Beverages Coffee and tea, with or without caffeine. Carbonated beverages.  Sodas. Energy drinks. Fruit juice made with acidic fruits (such as orange or grapefruit). Tomato juice. Alcoholic drinks. Fats and oils Butter. Margarine. Shortening. Ghee. Sweets and desserts Chocolate and cocoa. Donuts. Seasoning and other foods Pepper. Peppermint and spearmint. Any condiments, herbs, or seasonings that cause symptoms. For some people, this may include curry, hot sauce, or vinegar-based salad dressings. Summary  When you have gastroesophageal reflux disease (GERD), food and lifestyle choices are very important to help ease the discomfort of GERD.  Eat frequent, small meals instead of three large meals each day. Eat your meals slowly, in a relaxed setting. Avoid bending over or lying down until 2-3 hours after eating.  Limit high-fat foods such as fatty meat or fried foods. This information is not intended to replace advice given to you by your health care provider. Make sure you discuss any questions you have with your health care provider. Document Revised: 12/10/2018 Document Reviewed: 08/20/2016 Elsevier Patient Education  2020 [employer].  Thoracic Strain Rehab Ask your health care provider which exercises are safe for you. Do exercises exactly as told by your health care provider and adjust them as directed. It is normal to feel mild stretching, pulling, tightness, or discomfort as you do these exercises. Stop right away if you feel sudden pain or your pain gets worse. Do not begin these exercises until told by your health care provider. Stretching and range-of-motion exercise This exercise warms up your muscles and joints and improves the movement and flexibility of your back and shoulders. This exercise also helps to relieve pain. Chest and spine stretch  1. Lie down on your back on a firm surface. 2. Roll a towel or a small blanket so it is about 4 inches (10 cm) in diameter. 3. Put the towel lengthwise under the middle of your back so it is under your spine,  but not under your shoulder blades. 4. Put your hands behind your head and let your elbows fall to your sides. This will increase your stretch. 5. Take a deep breath (inhale). 6. Hold for __________ seconds. 7. Relax after you breathe out (exhale). Repeat __________ times. Complete this exercise __________ times a day. Strengthening exercises These exercises build strength and endurance in your back and your shoulder blade muscles. Endurance is the ability to use your muscles for a long time, even after they get tired. Alternating arm and leg raises  1. Get on your hands and knees on a firm surface. If you are on a hard floor, you may want to use padding, such as an exercise mat, to cushion your knees. 2. Line up your arms and legs. Your hands should be directly below your shoulders, and your knees should be directly below your hips. 3. Lift your left leg  behind you. At the same time, raise your right arm and straighten it in front of you. ? Do not lift your leg higher than your hip. ? Do not lift your arm higher than your shoulder. ? Keep your abdominal and back muscles tight. ? Keep your hips facing the ground. ? Do not arch your back. ? Keep your balance carefully, and do not hold your breath. 4. Hold for __________ seconds. 5. Slowly return to the starting position and repeat with your right leg and your left arm. Repeat __________ times. Complete this exercise __________ times a day. Straight arm rows This exercise is also called shoulder extension exercise. 1. Stand with your feet shoulder width apart. 2. Secure an exercise band to a stable object in front of you so the band is at or above shoulder height. 3. Hold one end of the exercise band in each hand. 4. Straighten your elbows and lift your hands up to shoulder height. 5. Step back, away from the secured end of the exercise band, until the band stretches. 6. Squeeze your shoulder blades together and pull your hands down to the  sides of your thighs. Stop when your hands are straight down by your sides. This is shoulder extension. Do not let your hands go behind your body. 7. Hold for __________ seconds. 8. Slowly return to the starting position. Repeat __________ times. Complete this exercise __________ times a day. Prone shoulder external rotation 1. Lie on your abdomen on a firm bed so your left / right forearm hangs over the edge of the bed and your upper arm is on the bed, straight out from your body. This is the prone position. ? Your elbow should be bent. ? Your palm should be facing your feet. 2. If instructed, hold a __________ weight in your hand. 3. Squeeze your shoulder blade toward the middle of your back. Do not let your shoulder lift toward your ear. 4. Keep your elbow bent in a 90-degree angle (right angle) while you slowly move your forearm up toward the ceiling. Move your forearm up to the height of the bed, toward your head. This is external rotation. ? Your upper arm should not move. ? At the top of the movement, your palm should face the floor. 5. Hold for __________ seconds. 6. Slowly return to the starting position and relax your muscles. Repeat __________ times. Complete this exercise __________ times a day. Rowing scapular retraction This is an exercise in which the shoulder blades (scapulae) are pulled toward each other (retraction). 1. Sit in a stable chair without armrests, or stand up. 2. Secure an exercise band to a stable object in front of you so the band is at shoulder height. 3. Hold one end of the exercise band in each hand. Your palms should face down. 4. Bring your arms out straight in front of you. 5. Step back, away from the secured end of the exercise band, until the band stretches. 6. Pull the band backward. As you do this, bend your elbows and squeeze your shoulder blades together, but avoid letting the rest of your body move. Do not shrug your shoulders upward while you do  this. 7. Stop when your elbows are at your sides or slightly behind your body. 8. Hold for __________ seconds. 9. Slowly straighten your arms to return to the starting position. Repeat __________ times. Complete this exercise __________ times a day. Posture and body mechanics Good posture and healthy body mechanics can help to relieve  stress in your body's tissues and joints. Body mechanics refers to the movements and positions of your body while you do your daily activities. Posture is part of body mechanics. Good posture means:  Your spine is in its natural S-curve position (neutral).  Your shoulders are pulled back slightly.  Your head is not tipped forward. Follow these guidelines to improve your posture and body mechanics in your everyday activities. Standing   When standing, keep your spine neutral and your feet about hip width apart. Keep a slight bend in your knees. Your ears, shoulders, and hips should line up with each other.  When you do a task in which you lean forward while standing in one place for a long time, place one foot up on a stable object that is 2-4 inches (5-10 cm) high, such as a footstool. This helps keep your spine neutral. Sitting   When sitting, keep your spine neutral and keep your feet flat on the floor. Use a footrest, if necessary, and keep your thighs parallel to the floor. Avoid rounding your shoulders, and avoid tilting your head forward.  When working at a desk or a computer, keep your desk at a height where your hands are slightly lower than your elbows. Slide your chair under your desk so you are close enough to maintain good posture.  When working at a computer, place your monitor at a height where you are looking straight ahead and you do not have to tilt your head forward or downward to look at the screen. Resting When lying down and resting, avoid positions that are most painful for you.  If you have pain with activities such as sitting,  bending, stooping, or squatting (flexion-basedactivities), lie in a position in which your body does not bend very much. For example, avoid curling up on your side with your arms and knees near your chest (fetal position).  If you have pain with activities such as standing for a long time or reaching with your arms (extension-basedactivities), lie with your spine in a neutral position and bend your knees slightly. Try the following positions: ? Lie on your side with a pillow between your knees. ? Lie on your back with a pillow under your knees.  Lifting   When lifting objects, keep your feet at least shoulder width apart and tighten your abdominal muscles.  Bend your knees and hips and keep your spine neutral. It is important to lift using the strength of your legs, not your back. Do not lock your knees straight out.  Always ask for help to lift heavy or awkward objects. This information is not intended to replace advice given to you by your health care provider. Make sure you discuss any questions you have with your health care provider. Document Revised: 12/11/2018 Document Reviewed: 09/28/2018 Elsevier Patient Education  [place].  Back Exercises The following exercises strengthen the muscles that help to support the trunk and back. They also help to keep the lower back flexible. Doing these exercises can help to prevent back pain or lessen existing pain.  If you have back pain or discomfort, try doing these exercises 2-3 times each day or as told by your health care provider.  As your pain improves, do them once each day, but increase the number of times that you repeat the steps for each exercise (do more repetitions).  To prevent the recurrence of back pain, continue to do these exercises once each day or as told by your  health care provider. Do exercises exactly as told by your health care provider and adjust them as directed. It is normal to feel mild stretching, pulling,  tightness, or discomfort as you do these exercises, but you should stop right away if you feel sudden pain or your pain gets worse. Exercises Single knee to chest Repeat these steps 3-5 times for each leg: 1. Lie on your back on a firm bed or the floor with your legs extended. 2. Bring one knee to your chest. Your other leg should stay extended and in contact with the floor. 3. Hold your knee in place by grabbing your knee or thigh with both hands and hold. 4. Pull on your knee until you feel a gentle stretch in your lower back or buttocks. 5. Hold the stretch for 10-30 seconds. 6. Slowly release and straighten your leg. Pelvic tilt Repeat these steps 5-10 times: 1. Lie on your back on a firm bed or the floor with your legs extended. 2. Bend your knees so they are pointing toward the ceiling and your feet are flat on the floor. 3. Tighten your lower abdominal muscles to press your lower back against the floor. This motion will tilt your pelvis so your tailbone points up toward the ceiling instead of pointing to your feet or the floor. 4. With gentle tension and even breathing, hold this position for 5-10 seconds. Cat-cow Repeat these steps until your lower back becomes more flexible: 1. Get into a hands-and-knees position on a firm surface. Keep your hands under your shoulders, and keep your knees under your hips. You may place padding under your knees for comfort. 2. Let your head hang down toward your chest. Contract your abdominal muscles and point your tailbone toward the floor so your lower back becomes rounded like the back of a cat. 3. Hold this position for 5 seconds. 4. Slowly lift your head, let your abdominal muscles relax and point your tailbone up toward the ceiling so your back forms a sagging arch like the back of a cow. 5. Hold this position for 5 seconds.  Press-ups Repeat these steps 5-10 times: 1. Lie on your abdomen (face-down) on the floor. 2. Place your palms near  your head, about shoulder-width apart. 3. Keeping your back as relaxed as possible and keeping your hips on the floor, slowly straighten your arms to raise the top half of your body and lift your shoulders. Do not use your back muscles to raise your upper torso. You may adjust the placement of your hands to make yourself more comfortable. 4. Hold this position for 5 seconds while you keep your back relaxed. 5. Slowly return to lying flat on the floor.  Bridges Repeat these steps 10 times: 1. Lie on your back on a firm surface. 2. Bend your knees so they are pointing toward the ceiling and your feet are flat on the floor. Your arms should be flat at your sides, next to your body. 3. Tighten your buttocks muscles and lift your buttocks off the floor until your waist is at almost the same height as your knees. You should feel the muscles working in your buttocks and the back of your thighs. If you do not feel these muscles, slide your feet 1-2 inches farther away from your buttocks. 4. Hold this position for 3-5 seconds. 5. Slowly lower your hips to the starting position, and allow your buttocks muscles to relax completely. If this exercise is too easy, try doing it with  your arms crossed over your chest. Abdominal crunches Repeat these steps 5-10 times: 1. Lie on your back on a firm bed or the floor with your legs extended. 2. Bend your knees so they are pointing toward the ceiling and your feet are flat on the floor. 3. Cross your arms over your chest. 4. Tip your chin slightly toward your chest without bending your neck. 5. Tighten your abdominal muscles and slowly raise your trunk (torso) high enough to lift your shoulder blades a tiny bit off the floor. Avoid raising your torso higher than that because it can put too much stress on your low back and does not help to strengthen your abdominal muscles. 6. Slowly return to your starting position. Back lifts Repeat these steps 5-10 times: 1. Lie  on your abdomen (face-down) with your arms at your sides, and rest your forehead on the floor. 2. Tighten the muscles in your legs and your buttocks. 3. Slowly lift your chest off the floor while you keep your hips pressed to the floor. Keep the back of your head in line with the curve in your back. Your eyes should be looking at the floor. 4. Hold this position for 3-5 seconds. 5. Slowly return to your starting position. Contact a health care provider if:  Your back pain or discomfort gets much worse when you do an exercise.  Your worsening back pain or discomfort does not lessen within 2 hours after you exercise. If you have any of these problems, stop doing these exercises right away. Do not do them again unless your health care provider says that you can. Get help right away if:  You develop sudden, severe back pain. If this happens, stop doing the exercises right away. Do not do them again unless your health care provider says that you can. This information is not intended to replace advice given to you by your health care provider. Make sure you discuss any questions you have with your health care provider. Document Revised: 12/24/2018 Document Reviewed: 05/21/2018 Elsevier Patient Education  [place].

## 2020-07-19 DIAGNOSIS — E669 Obesity, unspecified: Secondary | ICD-10-CM | POA: Insufficient documentation

## 2020-07-31 MED FILL — ROSUVASTATIN CALCIUM 20 MG: 20 | 30 days supply | Qty: 30 | Fill #1

## 2020-07-31 MED FILL — TRIAMTERENE-HCTZ 37.5-25 MG: 37.5-25 | 90 days supply | Qty: 45 | Fill #1

## 2020-07-31 MED FILL — CARVEDILOL 3.125 MG TABLET: 3.125 | 90 days supply | Qty: 180 | Fill #1

## 2020-08-21 ENCOUNTER — Encounter: Payer: Self-pay | Admitting: Internal Medicine

## 2020-08-21 ENCOUNTER — Encounter: Payer: No Typology Code available for payment source | Admitting: Internal Medicine

## 2020-08-21 NOTE — Progress Notes (Signed)
Erroneous encounter - patient did not show for appt.

## 2020-10-10 ENCOUNTER — Ambulatory Visit (AMBULATORY_SURGERY_CENTER): Payer: Self-pay | Admitting: *Deleted

## 2020-10-10 ENCOUNTER — Other Ambulatory Visit: Payer: Self-pay

## 2020-10-10 VITALS — Ht 62.5 in | Wt 168.0 lb

## 2020-10-10 DIAGNOSIS — Z1211 Encounter for screening for malignant neoplasm of colon: Secondary | ICD-10-CM

## 2020-10-10 MED ORDER — SUPREP BOWEL PREP KIT 17.5-3.13-1.6 GM/177ML PO SOLN: 1.0000 | Freq: Once | ORAL | 0 refills | Status: AC

## 2020-10-10 NOTE — Progress Notes (Signed)
No egg or soy allergy known to patient  No issues with past sedation with any surgeries or procedures No intubation problems in the past  No FH of Malignant Hyperthermia No diet pills per patient No home 02 use per patient  No blood thinners per patient  Pt denies issues with constipation  No A fib or A flutter  EMMI video to pt or via [place] 19 guidelines implemented in [facility] today with Pt and RN  Pt is not  vaccinated  for Covid - she had covid 09-13-20- will not get a covid test   Due to the COVID-19 pandemic we are asking patients to follow certain guidelines.  Pt aware of COVID protocols and [facility] guidelines

## 2020-10-17 ENCOUNTER — Telehealth: Payer: Self-pay | Admitting: Gastroenterology

## 2020-10-17 NOTE — Telephone Encounter (Signed)
Pt states she had covid in January- having headaches on and off- -  her BP has been " running up " -  144/105 BP over the weekend  Sat pm- HA since FRI-  122/89 Sunday morning-  Sunday pm 138/99,  Last night BP was  11 pm- 130/99  This morning 119/85- still has headache, persistent HA   She said she has been eating  Canned soup and wonders if this is the cause   Since last week BP meds consistently daily and still with HA and elevated BP   Pt instructed she needs to see her PCP about her blood pressure to follow up  and get BP controlled-  she needs to RS her colon until after her BP better controlled-   2-22 colon canceled       Recall placed- # [id number]  Pt will CB to RS or we can recall April 2022

## 2020-10-17 NOTE — Telephone Encounter (Signed)
Pt is requesting a call back from a nurse to discuss if she should have her colonoscopy even though she is suffering from high blood pressure.

## 2020-10-24 ENCOUNTER — Encounter: Payer: No Typology Code available for payment source | Admitting: Gastroenterology

## 2020-11-14 ENCOUNTER — Ambulatory Visit (HOSPITAL_COMMUNITY)
Admission: EM | Admit: 2020-11-14 | Discharge: 2020-11-14 | Disposition: A | Discharge status: Home / Self Care | Payer: 59 | Attending: Urgent Care | Admitting: Urgent Care

## 2020-11-14 ENCOUNTER — Encounter (HOSPITAL_COMMUNITY): Payer: Self-pay | Admitting: Emergency Medicine

## 2020-11-14 ENCOUNTER — Other Ambulatory Visit: Payer: Self-pay

## 2020-11-14 DIAGNOSIS — J029 Acute pharyngitis, unspecified: Secondary | ICD-10-CM | POA: Diagnosis not present

## 2020-11-14 DIAGNOSIS — Z7251 High risk heterosexual behavior: Secondary | ICD-10-CM | POA: Insufficient documentation

## 2020-11-14 LAB — POCT RAPID STREP A, ED / UC: Streptococcus, Group A Screen (Direct): NEGATIVE

## 2020-11-14 LAB — HIV ANTIBODY (ROUTINE TESTING W REFLEX): HIV Screen 4th Generation wRfx: NONREACTIVE

## 2020-11-14 MED ORDER — CETIRIZINE HCL 10 MG PO TABS: 10.0000 mg | ORAL_TABLET | Freq: Every day | ORAL | 0 refills | Status: DC

## 2020-11-14 MED ORDER — PSEUDOEPHEDRINE HCL 30 MG PO TABS: 30.0000 mg | ORAL_TABLET | Freq: Three times a day (TID) | ORAL | 0 refills | Status: DC | PRN

## 2020-11-14 NOTE — ED Provider Notes (Signed)
[facility]   MRN: [id number] DOB: 1971/04/13  Subjective:   [name] is a 50 y.o. female presenting for several day history of throat irritation, scratchy throat. Feels like she can "taste" infection in her mouth. She had COVID ~1.5 months ago. Would like to make sure she gets checked for infections of the throat. She is sexually active with a new partner, does not use condoms for protection with oral sex.   No current facility-administered medications for this encounter.  Current Outpatient Medications:  .  cetirizine (ZYRTEC ALLERGY) 10 MG tablet, Take 1 tablet (10 mg total) by mouth daily., Disp: 30 tablet, Rfl: 0 .  pseudoephedrine (SUDAFED) 30 MG tablet, Take 1 tablet (30 mg total) by mouth every 8 (eight) hours as needed for congestion., Disp: 30 tablet, Rfl: 0 .  aspirin EC 81 MG tablet, Take 81 mg by mouth daily. Swallow whole. (Patient not taking: No sig reported), Disp: , Rfl:  .  carvedilol (COREG) 3.125 MG tablet, Take 1 tablet (3.125 mg total) by mouth 2 (two) times daily with a meal., Disp: 180 tablet, Rfl: 3 .  clobetasol (TEMOVATE) 0.05 % external solution, SMARTSIG:1 Milliliter(s) Topical Daily (Patient not taking: No sig reported), Disp: , Rfl:  .  cyclobenzaprine (FLEXERIL) 5 MG tablet, Take 1 tablet (5 mg total) by mouth at bedtime as needed for muscle spasms. (Patient not taking: Reported on 10/10/2020), Disp: 90 tablet, Rfl: 1 .  famotidine (PEPCID) 20 MG tablet, Take 1 tablet (20 mg total) by mouth 2 (two) times daily. Prn reflux, Disp: 60 tablet, Rfl: 11 .  hydroxychloroquine (PLAQUENIL) 200 MG tablet, Take 200 mg by mouth 2 (two) times daily. (Patient not taking: No sig reported), Disp: , Rfl:  .  rosuvastatin (CRESTOR) 20 MG tablet, Take 1 tablet (20 mg total) by mouth daily. (Patient not taking: No sig reported), Disp: 30 tablet, Rfl: 6 .  triamterene-hydrochlorothiazide (MAXZIDE-25) 37.5-25 MG tablet, Take 0.5 tablets by mouth daily. In am.  Cut pill in 1/2 for patient. Stop hctz 12.5 alone dose, Disp: 45 tablet, Rfl: 3 .  VITAMIN D PO, Take 5,000 Units by mouth daily.  (Patient not taking: No sig reported), Disp: , Rfl:    Allergies  Allergen Reactions  . Codeine Shortness Of Breath    Breathing problems  - liquid narcotic pain med with codeine   . Crestor [Rosuvastatin]     Abdominal bloating elevated lfts    . Erythromycin     GI upset   . Levofloxacin     Heart racing     Past Medical History:  Diagnosis Date  . Allergy   . Anemia    past hx - corrected after hysterectomy   . Anxiety    past hx   . Aortic insufficiency   . ASCUS favoring benign 12/2015   negative high risk HPV  . Chicken pox   . COVID-19 december2020, 09-13-2020  . GERD (gastroesophageal reflux disease)    during pregnancy   . 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 Hyperlipidemia   . Hypertension    during pregnancy   . 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 Lichen planopilaris    scalp  . Reflux   . Sleep apnea      Past Surgical History:  Procedure Laterality Date  . COLPOSCOPY    . LAPAROSCOPIC VAGINAL HYSTERECTOMY WITH SALPINGO OOPHORECTOMY Bilateral 08/10/2018   Procedure: LAPAROSCOPIC  ASSISTED VAGINAL HYSTERECTOMY WITH BILATERAL SALPINGO OOPHORECTOMY;  Surgeon: [name], MD;  Location: [facility];  Service: Gynecology;  Laterality: Bilateral;  . TONSILLECTOMY AND ADENOIDECTOMY      Family History  Problem Relation Age of Onset  . Hypertension Mother   . Cancer Mother        LUNG CANCER/SMOKER  . Depression Mother   . Mental illness Mother   . Lung cancer Mother   . Liver cancer Mother   . Diabetes Father   . Hypertension Father   . Heart disease Father   . Hyperlipidemia Father   . Stroke Father   . Hypertension Brother   . Diabetes Brother   . Depression Brother   . Hyperlipidemia Brother   . Cancer Maternal Uncle        ESOPHAGEAL CA  . Esophageal  cancer Maternal Uncle   . Diabetes Cousin   . Heart disease Cousin   . Cancer Maternal Grandfather        Brain  . Brain cancer Maternal Grandfather   . Stroke Paternal Grandmother   . Depression Maternal Grandmother   . Mental retardation Maternal Grandmother   . Stroke Paternal Grandfather   . Colon cancer Neg Hx   . Colon polyps Neg Hx   . Rectal cancer Neg Hx   . Stomach cancer Neg Hx     Social History   Tobacco Use  . Smoking status: Former Smoker    Types: Cigarettes    Quit date: 09/02/1990    Years since quitting: 30.2  . Smokeless tobacco: Never Used  . Tobacco comment: smoked socially in high school   Vaping Use  . Vaping Use: Never used  Substance Use Topics  . Alcohol use: Not Currently    Alcohol/week: 0.0 standard drinks    Comment: occasionally   . Drug use: No    ROS   Objective:   Vitals: BP (!) 141/82 (BP Location: Left Arm)   Pulse 64   Temp 98 F (36.7 C) (Oral)   Resp 18   LMP 08/02/2018 (Exact Date)   SpO2 98%   Physical Exam Constitutional:      General: She is not in acute distress.    Appearance: Normal appearance. She is well-developed. She is not ill-appearing, toxic-appearing or diaphoretic.  HENT:     Head: Normocephalic and atraumatic.     Nose: Nose normal.     Mouth/Throat:     Mouth: Mucous membranes are moist.     Pharynx: Oropharynx is clear. No pharyngeal swelling, oropharyngeal exudate, posterior oropharyngeal erythema or uvula swelling.     Tonsils: No tonsillar exudate or tonsillar abscesses. 0 on the right. 0 on the left.  Eyes:     General: No scleral icterus.       Right eye: No discharge.        Left eye: No discharge.     Extraocular Movements: Extraocular movements intact.     Conjunctiva/sclera: Conjunctivae normal.     Pupils: Pupils are equal, round, and reactive to light.  Cardiovascular:     Rate and Rhythm: Normal rate.  Pulmonary:     Effort: Pulmonary effort is normal.  Skin:    General: Skin is  warm and dry.  Neurological:     General: No focal deficit present.     Mental Status: She is alert and oriented to person, place, and time.  Psychiatric:        Mood and Affect:  Mood normal.        Behavior: Behavior normal.        Thought Content: Thought content normal.        Judgment: Judgment normal.     Results for orders placed or performed during the hospital encounter of 11/14/20 (from the past 24 hour(s))  POCT Rapid Strep A     Status: None   Collection Time: 11/14/20  8:18 PM  Result Value Ref Range   Streptococcus, Group A Screen (Direct) NEGATIVE NEGATIVE    Assessment and Plan :   PDMP not reviewed this encounter.  1. Sore throat   2. Unprotected sex     Suspect patient has throat symptoms from postnasal drainage.  She does state that she lives in a new home and has had a lot of difficult time with odors and believes there is mold.  However, I obliged her and checked her for strep, did the oral swab and will do HIV and RPR.  Recommended she follow-up for additional concerns with her PCP.  We will have her start a regimen to help against postnasal drainage. Counseled patient on potential for adverse effects with medications prescribed/recommended today, ER and return-to-clinic precautions discussed, patient verbalized understanding.    Jaynee Eagles, PA-C 11/17/20 1022

## 2020-11-14 NOTE — ED Triage Notes (Signed)
Pt presents with sore throat/ irritation. States had COVID in mid Jan. States has "infection taste" in throat.

## 2020-11-14 NOTE — Discharge Instructions (Signed)
Please start taking Zyrtec, pseudoephedrine to address postnasal drainage as a source of your throat pain.  We will let you know about your test results for the strep culture and the STI testing, the blood work.  Please make sure you follow-up with your regular care doctor for recheck on the other concerns and symptoms you may have.

## 2020-11-15 LAB — RPR: RPR Ser Ql: NONREACTIVE

## 2020-11-16 LAB — CYTOLOGY, (ORAL, ANAL, URETHRAL) ANCILLARY ONLY
Chlamydia: NEGATIVE
Comment: NEGATIVE
Comment: NEGATIVE
Comment: NORMAL
Neisseria Gonorrhea: NEGATIVE
Trichomonas: NEGATIVE

## 2020-11-17 ENCOUNTER — Telehealth: Payer: Self-pay | Admitting: Internal Medicine

## 2020-11-17 ENCOUNTER — Ambulatory Visit: Payer: No Typology Code available for payment source | Admitting: Internal Medicine

## 2020-11-17 LAB — CULTURE, GROUP A STREP (THRC)

## 2020-11-17 NOTE — Telephone Encounter (Signed)
Patient calling back in and states there was an emergency. She is currently rescheduling with the front desk.

## 2020-11-17 NOTE — Telephone Encounter (Signed)
Patient no-showed today's appointment; appointment was for 11/17/20, provider notified for review of record. Letter sent for patient to call in and re-schedule.

## 2020-12-05 ENCOUNTER — Other Ambulatory Visit (HOSPITAL_COMMUNITY): Payer: Self-pay

## 2020-12-05 MED FILL — Famotidine Tab 20 MG: ORAL | 30 days supply | Qty: 60 | Fill #0 | Status: AC

## 2020-12-05 MED FILL — Carvedilol Tab 3.125 MG: ORAL | 90 days supply | Qty: 180 | Fill #0 | Status: AC

## 2020-12-13 ENCOUNTER — Other Ambulatory Visit (HOSPITAL_COMMUNITY): Payer: Self-pay

## 2020-12-28 ENCOUNTER — Encounter: Payer: Self-pay | Admitting: Gastroenterology

## 2021-01-08 ENCOUNTER — Other Ambulatory Visit: Payer: Self-pay

## 2021-01-09 ENCOUNTER — Ambulatory Visit (HOSPITAL_COMMUNITY): Payer: 59 | Attending: Cardiology

## 2021-01-09 ENCOUNTER — Encounter (HOSPITAL_COMMUNITY): Payer: Self-pay

## 2021-01-09 NOTE — Progress Notes (Signed)
Verified appointment "no show" status with S. Johnson at 07:27.  

## 2021-01-12 ENCOUNTER — Encounter: Payer: Self-pay | Admitting: Internal Medicine

## 2021-01-12 ENCOUNTER — Ambulatory Visit: Payer: 59 | Admitting: Internal Medicine

## 2021-01-12 ENCOUNTER — Other Ambulatory Visit: Payer: Self-pay

## 2021-01-12 VITALS — BP 126/80 | HR 69 | Temp 97.7°F | Ht 62.5 in | Wt 164.4 lb

## 2021-01-12 DIAGNOSIS — Z91048 Other nonmedicinal substance allergy status: Secondary | ICD-10-CM | POA: Diagnosis not present

## 2021-01-12 DIAGNOSIS — K130 Diseases of lips: Secondary | ICD-10-CM

## 2021-01-12 DIAGNOSIS — R1013 Epigastric pain: Secondary | ICD-10-CM

## 2021-01-12 DIAGNOSIS — Z1389 Encounter for screening for other disorder: Secondary | ICD-10-CM

## 2021-01-12 DIAGNOSIS — S0992XA Unspecified injury of nose, initial encounter: Secondary | ICD-10-CM | POA: Diagnosis not present

## 2021-01-12 DIAGNOSIS — K449 Diaphragmatic hernia without obstruction or gangrene: Secondary | ICD-10-CM | POA: Diagnosis not present

## 2021-01-12 DIAGNOSIS — G4733 Obstructive sleep apnea (adult) (pediatric): Secondary | ICD-10-CM

## 2021-01-12 DIAGNOSIS — Z1329 Encounter for screening for other suspected endocrine disorder: Secondary | ICD-10-CM

## 2021-01-12 DIAGNOSIS — Z77018 Contact with and (suspected) exposure to other hazardous metals: Secondary | ICD-10-CM

## 2021-01-12 DIAGNOSIS — R196 Halitosis: Secondary | ICD-10-CM

## 2021-01-12 DIAGNOSIS — E785 Hyperlipidemia, unspecified: Secondary | ICD-10-CM | POA: Diagnosis not present

## 2021-01-12 DIAGNOSIS — E559 Vitamin D deficiency, unspecified: Secondary | ICD-10-CM

## 2021-01-12 DIAGNOSIS — J029 Acute pharyngitis, unspecified: Secondary | ICD-10-CM

## 2021-01-12 DIAGNOSIS — R1084 Generalized abdominal pain: Secondary | ICD-10-CM

## 2021-01-12 DIAGNOSIS — I10 Essential (primary) hypertension: Secondary | ICD-10-CM

## 2021-01-12 NOTE — Progress Notes (Signed)
Chief Complaint  Patient presents with  . Follow-up   F/u  1. 10/2020 went to urgent care for sore throat, "tastes like infection since having covid" and gift box feel on her face hitting her in the nose 08/2020 or 09/2020, also c/w mold in her home living with a friend -wants referral to ENT  She wonders if she has had mono and wants testing 2. RUQ ab pain 4/10 on and off x 1 week intermittent. Nothing tried nothing worse or better. She is s/p hysterectomy Prior US  IMPRESSION: 1. Question parapelvic cysts versus mild fullness of the left renal collecting system. No obstructing focus evident.  2.  Study otherwise unremarkable.   Electronically Signed   By: Lowella Grip III M.D.   On: 04/12/2019 08:26 3. HLD stopped crestor 20 mg qhs due to body aches cc'ed Dr. Debara Pickett to f/u with patient  4. Right upper lip lesion x 1 week a bump not ulcer w/o sx's informed pt if does not resolve f/u with dermatology she is established and can call for appt 5. Prior disc of neurology referral appt 01/31/21 she had to put this off wants neurology to consult via exception for flu and pfizer/covid 19 vaccines and other issues previously noted  6. OSA mild as of sleep study 03/07/20 but pt never got cpap sent message to Dr. Sherrilyn Rist re this issue and given # to call to f/u she states she called previously but no one f/u  7. Of note never had labs ordered 07/14/20 and states will go next week   Review of Systems  Constitutional: Negative for weight loss.  HENT: Positive for sore throat. Negative for hearing loss.   Eyes: Negative for blurred vision.  Respiratory: Negative for shortness of breath.   Cardiovascular: Negative for chest pain.  Gastrointestinal: Positive for abdominal pain. Negative for constipation, diarrhea, nausea and vomiting.  Skin: Negative for rash.  Neurological: Negative for headaches.  Psychiatric/Behavioral: Negative for depression.   Past Medical History:  Diagnosis  Date  . Allergy   . Anemia    past hx - corrected after hysterectomy   . Anxiety    past hx   . Aortic insufficiency   . ASCUS favoring benign 12/2015   negative high risk HPV  . Chicken pox   . COVID-19 december2020, 09-13-2020  . GERD (gastroesophageal reflux disease)    during pregnancy   . 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 Hiatal hernia   . Hyperlipidemia   . Hypertension    during pregnancy   . 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 Lichen planopilaris    scalp  . OSA (obstructive sleep apnea)   . Parapelvic renal cyst   . Reflux   . Sleep apnea    Past Surgical History:  Procedure Laterality Date  . COLPOSCOPY    . LAPAROSCOPIC VAGINAL HYSTERECTOMY WITH SALPINGO OOPHORECTOMY Bilateral 08/10/2018   Procedure: LAPAROSCOPIC ASSISTED VAGINAL HYSTERECTOMY WITH BILATERAL SALPINGO OOPHORECTOMY;  Surgeon: [name], MD;  Location: [facility];  Service: Gynecology;  Laterality: Bilateral;  . TONSILLECTOMY AND ADENOIDECTOMY     Family History  Problem Relation Age of Onset  . Hypertension Mother   . Cancer Mother        LUNG CANCER/SMOKER  . Depression Mother   . Mental illness Mother   . Lung cancer Mother   . Liver cancer Mother   . Diabetes Father   .  Hypertension Father   . Heart disease Father   . Hyperlipidemia Father   . Stroke Father   . Hypertension Brother   . Diabetes Brother   . Depression Brother   . Hyperlipidemia Brother   . Cancer Maternal Uncle        ESOPHAGEAL CA  . Esophageal cancer Maternal Uncle   . Diabetes Cousin   . Heart disease Cousin   . Cancer Maternal Grandfather        Brain  . Brain cancer Maternal Grandfather   . Stroke Paternal Grandmother   . Depression Maternal Grandmother   . Mental retardation Maternal Grandmother   . Stroke Paternal Grandfather   . Colon cancer Neg Hx   . Colon polyps Neg Hx   . Rectal cancer Neg Hx   . Stomach cancer Neg Hx    Social  History   Socioeconomic History  . Marital status: Divorced    Spouse name: Not on file  . Number of children: 2  . Years of education: Not on file  . Highest education level: Not on file  Occupational History  . Not on file  Tobacco Use  . Smoking status: Former Smoker    Types: Cigarettes    Quit date: 09/02/1990    Years since quitting: 30.3  . Smokeless tobacco: Never Used  . Tobacco comment: smoked socially in high school   Vaping Use  . Vaping Use: Never used  Substance and Sexual Activity  . Alcohol use: Not Currently    Alcohol/week: 0.0 standard drinks    Comment: occasionally   . Drug use: No  . Sexual activity: Not Currently    Birth control/protection: Surgical    Comment: 1st intercourse 50 yo-More than 5 partners-Vasectomy  Other Topics Concern  . 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 aspirin EC 81 MG tablet Take 81 mg by mouth daily. Swallow whole.  . carvedilol (COREG) 3.125 MG tablet TAKE 1 TABLET (3.125 MG TOTAL) BY MOUTH 2 (TWO) TIMES DAILY WITH A MEAL.  . clobetasol (TEMOVATE) 0.05 % external solution SMARTSIG:1 Milliliter(s) Topical Daily  . cyclobenzaprine (FLEXERIL) 5 MG tablet TAKE 1 TABLET (5 MG TOTAL) BY MOUTH AT BEDTIME AS NEEDED FOR MUSCLE SPASMS.  . famotidine (PEPCID) 20 MG tablet TAKE 1 TABLET BY MOUTH TWICE DAILY AS NEEDED FOR REFLUX.  . hydroxychloroquine (PLAQUENIL) 200 MG tablet Take 200 mg by mouth 2 (two) times daily.  Marland Kitchen triamterene-hydrochlorothiazide (MAXZIDE-25) 37.5-25 MG tablet TAKE 1/2 TABLET BY MOUTH  EVERY MORNING.(STOP HCTZ 12.5 ALONE DOSE)  . VITAMIN D PO Take 5,000 Units by mouth daily.   Allergies  Allergen Reactions  . Codeine Shortness Of Breath    Breathing problems  - liquid narcotic pain med with codeine   . Crestor [Rosuvastatin]     Abdominal bloating elevated lfts    . Erythromycin     GI upset   . Levofloxacin  Heart racing    Recent Results (from the past 2160 hour(s))  HIV antibody     Status: None   Collection Time: 11/14/20  8:04 PM  Result Value Ref Range   HIV Screen 4th Generation wRfx Non Reactive Non Reactive    Comment: Performed at [facility], [address], [place], [place] [address]  RPR     Status: None   Collection Time: 11/14/20  8:04 PM  Result Value Ref Range   RPR Ser Ql NON REACTIVE NON REACTIVE    Comment: Performed at [facility], [place] [address], [place], [place] [address]  Cytology (oral, anal, urethral) ancillary only     Status: None   Collection Time: 11/14/20  8:07 PM  Result Value Ref Range   Neisseria Gonorrhea Negative    Chlamydia Negative    Trichomonas Negative    Comment Normal Reference Range Trichomonas - Negative    Comment Normal Reference Ranger Chlamydia - Negative    Comment      Normal Reference Range Neisseria Gonorrhea - Negative  POCT Rapid Strep A     Status: None   Collection Time: 11/14/20  8:18 PM  Result Value Ref Range   Streptococcus, Group A Screen (Direct) NEGATIVE NEGATIVE  Culture, group A strep (throat)     Status: None   Collection Time: 11/14/20  8:25 PM   Specimen: Throat  Result Value Ref Range   Specimen Description THROAT    Special Requests NONE    Culture      NO GROUP A STREP (S.PYOGENES) ISOLATED Performed at [facility], [address] [place],  [address]    Report Status 11/17/2020 FINAL    Objective  Body mass index is 29.59 kg/m. Wt Readings from Last 3 Encounters:  01/12/21 164 lb 6.4 oz (74.6 kg)  10/10/20 168 lb (76.2 kg)  07/14/20 174  lb (78.9 kg)   Temp Readings from Last 3 Encounters:  01/12/21 97.7 F (36.5 C) (Oral)  11/14/20 98 F (36.7 C) (Oral)  02/17/20 97.7 F (36.5 C) (Oral)   BP Readings from Last 3 Encounters:  01/12/21 126/80  11/14/20 (!) 141/82  04/07/20 115/81   Pulse Readings from Last 3 Encounters:  01/12/21 69  11/14/20 64  02/17/20 61    Physical Exam Vitals and nursing note reviewed.  Constitutional:      Appearance: Normal appearance. She is well-developed, well-groomed and overweight.  HENT:     Head: Normocephalic and atraumatic.   Eyes:     Conjunctiva/sclera: Conjunctivae normal.     Pupils: Pupils are equal, round, and reactive to light.  Cardiovascular:     Rate and Rhythm: Normal rate and regular rhythm.     Heart sounds: Normal heart sounds.  Pulmonary:     Effort: Pulmonary effort is normal.     Breath sounds: Normal breath sounds.  Abdominal:     Tenderness: There is generalized abdominal tenderness.  Skin:    General: Skin is warm and dry.  Neurological:     General: No focal deficit present.     Mental Status: She is alert and oriented to person, place, and time. Mental status is at baseline.     Gait: Gait normal.  Psychiatric:        Attention and Perception: Attention and perception normal.        Mood and Affect: Mood and affect normal.        Speech: Speech normal.  Behavior: Behavior normal. Behavior is cooperative.        Thought Content: Thought content normal.        Cognition and Memory: Cognition and memory normal.        Judgment: Judgment normal.     Assessment  Plan  Injury of nose,? Deviated septum  allergy testing I.e mold, h/o nose injury 08/2020, 09/2020, halitosis ? LPR needs scope to look  Sore throat - Plan: Monospot f/u ent as well r/o LPR  - Plan: Ambulatory referral to ENT Dr. Redmond Baseman  Epigastric pain and generalized - Plan: CT Abdomen Pelvis Wo Contrast, Comprehensive metabolic panel, CBC with Differential/Platelet,  Urinalysis, Routine w reflex microscopic -rec call and schedule f/u GI as well overdue colonoscopy and if imaging negative will need f/u GI   Hyperlipidemia, unspecified hyperlipidemia type - Plan: Lipid panel Of note pt stopped crestor 20 mg  Inform cardiology Dr. Debara Pickett to f/u with pt  Lip lesion Right upper lip  If doesn't resolved sch f/u dermatology does not appear as HSV   OSA (obstructive sleep apnea) NOT on cpap since 03/2020 testing  Sent pulmonary a message and pt to call to f/u   Hypertension, unspecified type - controlled on coreg 3.12.5 mg bid   Pt c/w Exposure to heavy metals - Plan: Heavy Metals, Blood  Vitamin D deficiency - Plan: Vitamin D (25 hydroxy)   HM-CPE at next visit 07/2021 in person 01/12/21 w multiple complaints Flu shot had10/21/20 2021 declines excempt  Tdap per pt had 02/2016 need to confirm atf/u 1/2 covid vaccine and had reaction declines further for now and wants exemption flu and covid vaccine neurology appt sch 01/31/21 H/o rabies vaccine  DEXA 03/09/18 normal  Pap 02/13/18 negative s/p hysterectomy 08/2018 fibroids and benign right ovarian cyst  -h/o abnormal pap LMP 10/19/17  -->Dr. 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 with neg pap afterwards  -also menorrhagia/breakthrough bleeding/right solid ovarian mass/endocmetrial polp noted 12/06/15 rec hysterectomy at that time and never f/u on this vs [place] and endometrial sampling if polyp then hysteroscopy with D&C with resection of polyp endometrial bx in 2012 benign fragments of endometrial polyp  Colonoscopy overdue prev-referred [facility] referral  She c/o change in bowels (firm and loose stools), ab bloating, constipation, GERD, hiatal hernia -call and reschedule previously referred as of 10/10/20 as of 01/12/21 pt still has not been 01/12/21 given # to call and reschedule   [facility]  normalorder in to call and sch GIB center in [place]  Of note pt declined from [facility], [facility] neurologyreason for neurology referral  (I.e muscle weakness in legspt ?s exposure to heavy metals as of 04/07/20)for referrals and Derm Dr. Delma Officer office in [place] she is to call back when finds who will take her as [facility]   eye MD [place] Dr. Melissa Noon per pt seeing allergies/inflammation given steroid eye drops for pressure in left eye and thick film in eyes -she will message me who is covered as far as allergist    Provider: Dr. Olivia Mackie McLean-Scocuzza-Internal Medicine

## 2021-01-12 NOTE — Patient Instructions (Addendum)
Dr. Redmond [facility]eman [facility] in [place]   Nasal saline, flonase or allergy pills zyrtec, allergra, claritin, xyzal at night as needed for mold exposure  Avoid exposure   Planet oat or chobani oat mild  Call and schedule dermatology appt about right upper lip lesion [facility]   Discuss vaginal dryness with ob/gyn topical creams/pills or replens over the counter vaginal dryness  Overactive bladder discuss with gyn consider kegels   Call GI and schedule Dr. Duanne Guess reschedule colonoscopy and discuss abdominal pain   [phone] Not available [address]   [address]   [place] [address]     MD Physician    Primary Contact Information  Phone Fax E-mail Address  [phone] Not available [address]   [place] [address]     Specialties     Pulmonary Disease       -->call about CPAP machine 03/2020 + sleep apnea and no machine   Try pepcid over the counter  Hiatal Hernia  A hiatal hernia occurs when part of the stomach slides above the muscle that separates the abdomen from the chest (diaphragm). A person can be born with a hiatal hernia (congenital), or it may develop over time. In almost all cases of hiatal hernia, only the top part of the stomach pushes through the diaphragm. Many people have a hiatal hernia with no symptoms. The larger the hernia, the more likely it is that you will have symptoms. In some cases, a hiatal hernia allows stomach acid to flow back into the tube that carries food from your mouth to your stomach (esophagus). This may cause heartburn symptoms. Severe heartburn symptoms may mean that you have developed a condition called gastroesophageal reflux disease (GERD). What are the causes? This condition is caused by a weakness in the opening (hiatus) where the esophagus passes through the diaphragm to attach to the upper part of the stomach. A person may be born with a weakness in the hiatus, or a weakness can  develop over time. What increases the risk? This condition is more likely to develop in:  Older people. Age is a major risk factor for a hiatal hernia, especially if you are over the age of 36.  Pregnant women.  People who are overweight.  People who have frequent constipation. What are the signs or symptoms? Symptoms of this condition usually develop in the form of GERD symptoms. Symptoms include:  Heartburn.  Belching.  Indigestion.  Trouble swallowing.  Coughing or wheezing.  Sore throat.  Hoarseness.  Chest pain.  Nausea and vomiting. How is this diagnosed? This condition may be diagnosed during testing for GERD. Tests that may be done include:  X-rays of your stomach or chest.  An upper gastrointestinal (GI) series. This is an X-ray exam of your GI tract that is taken after you swallow a chalky liquid that shows up clearly on the X-ray.  Endoscopy. This is a procedure to look into your stomach using a thin, flexible tube that has a tiny camera and light on the end of it. How is this treated? This condition may be treated by:  Dietary and lifestyle changes to help reduce GERD symptoms.  Medicines. These may include: ? Over-the-counter antacids. ? Medicines that make your stomach empty more quickly. ? Medicines that block the production of stomach acid (H2 blockers). ? Stronger medicines to reduce stomach acid (proton pump inhibitors).  Surgery to repair the hernia, if other treatments are not  helping. If you have no symptoms, you may not need treatment. Follow these instructions at home: Lifestyle and activity  Do not use any products that contain nicotine or tobacco, such as cigarettes and e-cigarettes. If you need help quitting, ask your health care provider.  Try to achieve and maintain a healthy body weight.  Avoid putting pressure on your abdomen. Anything that puts pressure on your abdomen increases the amount of acid that may be pushed up into  your esophagus. ? Avoid bending over, especially after eating. ? Raise the head of your bed by putting blocks under the legs. This keeps your head and esophagus higher than your stomach. ? Do not wear tight clothing around your chest or stomach. ? Try not to strain when having a bowel movement, when urinating, or when lifting heavy objects. Eating and drinking  Avoid foods that can worsen GERD symptoms. These may include: ? Fatty foods, like fried foods. ? Citrus fruits, like oranges or lemon. ? Other foods and drinks that contain acid, like orange juice or tomatoes. ? Spicy food. ? Chocolate.  Eat frequent small meals instead of three large meals a day. This helps prevent your stomach from getting too full. ? Eat slowly. ? Do not lie down right after eating. ? Do not eat 1-2 hours before bed.  Do not drink beverages with caffeine. These include cola, coffee, cocoa, and tea.  Do not drink alcohol. General instructions  Take over-the-counter and prescription medicines only as told by your health care provider.  Keep all follow-up visits as told by your health care provider. This is important. Contact a health care provider if:  Your symptoms are not controlled with medicines or lifestyle changes.  You are having trouble swallowing.  You have coughing or wheezing that will not go away. Get help right away if:  Your pain is getting worse.  Your pain spreads to your arms, neck, jaw, teeth, or back.  You have shortness of breath.  You sweat for no reason.  You feel sick to your stomach (nauseous) or you vomit.  You vomit blood.  You have bright red blood in your stools.  You have black, tarry stools. This information is not intended to replace advice given to you by your health care provider. Make sure you discuss any questions you have with your health care provider. Document Revised: 08/01/2017 Document Reviewed: 03/24/2017 Elsevier Patient Education  2021 [place].  Sore Throat A sore throat is pain, burning, irritation, or scratchiness in the throat. When you have a sore throat, you may feel pain or tenderness in your throat when you swallow or talk. Many things can cause a sore throat, including:  An infection.  Seasonal allergies.  Dryness in the air.  Irritants, such as smoke or pollution.  Radiation treatment to the area.  Gastroesophageal reflux disease (GERD).  A tumor. A sore throat is often the first sign of another sickness. It may happen with other symptoms, such as coughing, sneezing, fever, and swollen neck glands. Most sore throats go away without medical treatment. Follow these instructions at home:  Take over-the-counter medicines only as told by your health care provider. ? If your child has a sore throat, do not give your child aspirin because of the association with Reye syndrome.  Drink enough fluids to keep your urine pale yellow.  Rest as needed.  To help with pain, try: ? Sipping warm liquids, such as broth, herbal tea, or warm water. ? Eating or  drinking cold or frozen liquids, such as frozen ice pops. ? Gargling with a salt-water mixture 3-4 times a day or as needed. To make a salt-water mixture, completely dissolve -1 tsp (3-6 g) of salt in 1 cup (237 mL) of warm water. ? Sucking on hard candy or throat lozenges. ? Putting a cool-mist humidifier in your bedroom at night to moisten the air. ? Sitting in the bathroom with the door closed for 5-10 minutes while you run hot water in the shower.  Do not use any products that contain nicotine or tobacco, such as cigarettes, e-cigarettes, and chewing tobacco. If you need help quitting, ask your health care provider.  Wash your hands well and often with soap and water. If soap and water are not available, use hand sanitizer.      Contact a health care provider if:  You have a fever for more than 2-3 days.  You have symptoms that last (are persistent) for  more than 2-3 days.  Your throat does not get better within 7 days.  You have a fever and your symptoms suddenly get worse.  Your child who is 3 months to 71 years old has a temperature of 102.74F (39C) or higher. Get help right away if:  You have difficulty breathing.  You cannot swallow fluids, soft foods, or your saliva.  You have increased swelling in your throat or neck.  You have persistent nausea and vomiting. Summary  A sore throat is pain, burning, irritation, or scratchiness in the throat. Many things can cause a sore throat.  Take over-the-counter medicines only as told by your health care provider. Do not give your child aspirin.  Drink plenty of fluids, and rest as needed.  Contact a health care provider if your symptoms worsen or your sore throat does not get better within 7 days. This information is not intended to replace advice given to you by your health care provider. Make sure you discuss any questions you have with your health care provider. Document Revised: 01/19/2018 Document Reviewed: 01/19/2018 Elsevier Patient Education  2021 [employer] Inc.    Kegel Exercises  Kegel exercises can help strengthen your pelvic floor muscles. The pelvic floor is a group of muscles that support your rectum, small intestine, and bladder. In females, pelvic floor muscles also help support the womb (uterus). These muscles help you control the flow of urine and stool. Kegel exercises are painless and simple, and they do not require any equipment. Your provider may suggest Kegel exercises to:  Improve bladder and bowel control.  Improve sexual response.  Improve weak pelvic floor muscles after surgery to remove the uterus (hysterectomy) or pregnancy (females).  Improve weak pelvic floor muscles after prostate gland removal or surgery (males). Kegel exercises involve squeezing your pelvic floor muscles, which are the same muscles you squeeze when you try to stop the flow of  urine or keep from passing gas. The exercises can be done while sitting, standing, or lying down, but it is best to vary your position. Exercises How to do Kegel exercises: 1. Squeeze your pelvic floor muscles tight. You should feel a tight lift in your rectal area. If you are a female, you should also feel a tightness in your vaginal area. Keep your stomach, buttocks, and legs relaxed. 2. Hold the muscles tight for up to 10 seconds. 3. Breathe normally. 4. Relax your muscles. 5. Repeat as told by your health care provider. Repeat this exercise daily as told by your health care  provider. Continue to do this exercise for at least 4-6 weeks, or for as long as told by your health care provider. You may be referred to a physical therapist who can help you learn more about how to do Kegel exercises. Depending on your condition, your health care provider may recommend:  Varying how long you squeeze your muscles.  Doing several sets of exercises every day.  Doing exercises for several weeks.  Making Kegel exercises a part of your regular exercise routine. This information is not intended to replace advice given to you by your health care provider. Make sure you discuss any questions you have with your health care provider. Document Revised: 12/24/2019 Document Reviewed: 04/08/2018 Elsevier Patient Education  2021 [place].  Allergic Rhinitis, Adult  Allergic rhinitis is an allergic reaction that affects the mucous membrane inside the nose. The mucous membrane is the tissue that produces mucus. There are two types of allergic rhinitis:  Seasonal. This type is also called hay fever and happens only during certain seasons.  Perennial. This type can happen at any time of the year. Allergic rhinitis cannot be spread from person to person. This condition can be mild, moderate, or severe. It can develop at any age and may be outgrown. What are the causes? This condition is caused by  allergens. These are things that can cause an allergic reaction. Allergens may differ for seasonal allergic rhinitis and perennial allergic rhinitis.  Seasonal allergic rhinitis is triggered by pollen. Pollen can come from grasses, trees, and weeds.  Perennial allergic rhinitis may be triggered by: ? Dust mites. ? Proteins in a pet's urine, saliva, or dander. Dander is dead skin cells from a pet. ? Smoke, mold, or car fumes. What increases the risk? You are more likely to develop this condition if you have a family history of allergies or other conditions related to allergies, including:  Allergic conjunctivitis. This is inflammation of parts of the eyes and eyelids.  Asthma. This condition affects the lungs and makes it hard to breathe.  Atopic dermatitis or eczema. This is long term (chronic) inflammation of the skin.  Food allergies. What are the signs or symptoms? Symptoms of this condition include:  Sneezing or coughing.  A stuffy nose (nasal congestion), itchy nose, or nasal discharge.  Itchy eyes and tearing of the eyes.  A feeling of mucus dripping down the back of your throat (postnasal drip).  Trouble sleeping.  Tiredness or fatigue.  Headache.  Sore throat. How is this diagnosed? This condition may be diagnosed with your symptoms, medical history, and physical exam. Your health care provider may check for related conditions, such as:  Asthma.  Pink eye. This is eye inflammation caused by infection (conjunctivitis).  Ear infection.  Upper respiratory infection. This is an infection in the nose, throat, or upper airways. You may also have tests to find out which allergens trigger your symptoms. These may include skin tests or blood tests. How is this treated? There is no cure for this condition, but treatment can help control symptoms. Treatment may include:  Taking medicines that block allergy symptoms, such as corticosteroids and antihistamines. Medicine  may be given as a shot, nasal spray, or pill.  Avoiding any allergens.  Being exposed again and again to tiny amounts of allergens to help you build a defense against allergens (immunotherapy). This is done if other treatments have not helped. It may include: ? Allergy shots. These are injected medicines that have small amounts of allergen  in them. ? Sublingual immunotherapy. This involves taking small doses of a medicine with allergen in it under your tongue. If these treatments do not work, your health care provider may prescribe newer, stronger medicines. Follow these instructions at home: Avoiding allergens Find out what you are allergic to and avoid those allergens. These are some things you can do to help avoid allergens:  If you have perennial allergies: ? Replace carpet with wood, tile, or vinyl flooring. Carpet can trap dander and dust. ? Do not smoke. Do not allow smoking in your home. ? Change your heating and air conditioning filters at least once a month.  If you have seasonal allergies, take these steps during allergy season: ? Keep windows closed as much as possible. ? Plan outdoor activities when pollen counts are lowest. Check pollen counts before you plan outdoor activities. ? When coming indoors, change clothing and shower before sitting on furniture or bedding.  If you have a pet in the house that produces allergens: ? Keep the pet out of the bedroom. ? Vacuum, sweep, and dust regularly. General instructions  Take over-the-counter and prescription medicines only as told by your health care provider.  Drink enough fluid to keep your urine pale yellow.  Keep all follow-up visits as told by your health care provider. This is important. Where to find more information  American Academy of Allergy, Asthma & Immunology: www.aaaai.org Contact a health care provider if:  You have a fever.  You develop a cough that does not go away.  You make whistling sounds when  you breathe (wheeze).  Your symptoms slow you down or stop you from doing your normal activities each day. Get help right away if:  You have shortness of breath. This symptom may represent a serious problem that is an emergency. Do not wait to see if the symptom will go away. Get medical help right away. Call your local emergency services (911 in the [place] Do not drive yourself to the hospital. Summary  Allergic rhinitis may be managed by taking medicines as directed and avoiding allergens.  If you have seasonal allergies, keep windows closed as much as possible during allergy season.  Contact your health care provider if you develop a fever or a cough that does not go away. This information is not intended to replace advice given to you by your health care provider. Make sure you discuss any questions you have with your health care provider. Document Revised: 10/08/2019 Document Reviewed: 08/17/2019 Elsevier Patient Education  2021 [employer].

## 2021-01-15 ENCOUNTER — Encounter: Payer: Self-pay | Admitting: Internal Medicine

## 2021-01-15 DIAGNOSIS — K449 Diaphragmatic hernia without obstruction or gangrene: Secondary | ICD-10-CM | POA: Insufficient documentation

## 2021-01-15 DIAGNOSIS — K130 Diseases of lips: Secondary | ICD-10-CM | POA: Insufficient documentation

## 2021-01-15 DIAGNOSIS — G4733 Obstructive sleep apnea (adult) (pediatric): Secondary | ICD-10-CM | POA: Insufficient documentation

## 2021-01-19 ENCOUNTER — Telehealth: Payer: Self-pay | Admitting: Internal Medicine

## 2021-01-19 NOTE — Telephone Encounter (Signed)
lft vm for pt to call ofc to sch CT. thanks

## 2021-01-22 ENCOUNTER — Telehealth: Payer: Self-pay | Admitting: Internal Medicine

## 2021-01-22 NOTE — Telephone Encounter (Signed)
Yes it should on CT scan

## 2021-01-22 NOTE — Telephone Encounter (Signed)
Patient is under right rib and the second area is more higher in the center inch to  Three inches below right breast.Would the CT of abdomen see the area in question?

## 2021-01-22 NOTE — Telephone Encounter (Signed)
I called pt to get her scheduled for the CT pt has a question regarding the area she has the pain. If the CT abdomen will see where she's having the pain.   Please call pt at [phone]. Thank you!

## 2021-01-23 ENCOUNTER — Other Ambulatory Visit: Payer: Self-pay

## 2021-01-23 ENCOUNTER — Emergency Department (HOSPITAL_COMMUNITY)
Admission: EM | Admit: 2021-01-23 | Discharge: 2021-01-23 | Disposition: A | Discharge status: Home / Self Care | Payer: 59 | Attending: Emergency Medicine | Admitting: Emergency Medicine

## 2021-01-23 ENCOUNTER — Emergency Department (HOSPITAL_COMMUNITY): Payer: 59

## 2021-01-23 DIAGNOSIS — R072 Precordial pain: Secondary | ICD-10-CM | POA: Insufficient documentation

## 2021-01-23 DIAGNOSIS — Z90710 Acquired absence of both cervix and uterus: Secondary | ICD-10-CM | POA: Diagnosis not present

## 2021-01-23 DIAGNOSIS — Z79899 Other long term (current) drug therapy: Secondary | ICD-10-CM | POA: Diagnosis not present

## 2021-01-23 DIAGNOSIS — K219 Gastro-esophageal reflux disease without esophagitis: Secondary | ICD-10-CM | POA: Insufficient documentation

## 2021-01-23 DIAGNOSIS — Z8616 Personal history of COVID-19: Secondary | ICD-10-CM | POA: Insufficient documentation

## 2021-01-23 DIAGNOSIS — Z7982 Long term (current) use of aspirin: Secondary | ICD-10-CM | POA: Insufficient documentation

## 2021-01-23 DIAGNOSIS — Z20822 Contact with and (suspected) exposure to covid-19: Secondary | ICD-10-CM | POA: Diagnosis not present

## 2021-01-23 DIAGNOSIS — R0789 Other chest pain: Secondary | ICD-10-CM | POA: Diagnosis not present

## 2021-01-23 DIAGNOSIS — N281 Cyst of kidney, acquired: Secondary | ICD-10-CM | POA: Diagnosis not present

## 2021-01-23 DIAGNOSIS — R1013 Epigastric pain: Secondary | ICD-10-CM | POA: Insufficient documentation

## 2021-01-23 DIAGNOSIS — I10 Essential (primary) hypertension: Secondary | ICD-10-CM | POA: Diagnosis not present

## 2021-01-23 DIAGNOSIS — Q638 Other specified congenital malformations of kidney: Secondary | ICD-10-CM | POA: Diagnosis not present

## 2021-01-23 DIAGNOSIS — Q272 Other congenital malformations of renal artery: Secondary | ICD-10-CM | POA: Diagnosis not present

## 2021-01-23 DIAGNOSIS — M546 Pain in thoracic spine: Secondary | ICD-10-CM | POA: Diagnosis not present

## 2021-01-23 DIAGNOSIS — Z87891 Personal history of nicotine dependence: Secondary | ICD-10-CM | POA: Diagnosis not present

## 2021-01-23 DIAGNOSIS — R079 Chest pain, unspecified: Secondary | ICD-10-CM | POA: Diagnosis not present

## 2021-01-23 LAB — BASIC METABOLIC PANEL
Anion gap: 7 (ref 5–15)
BUN: 9 mg/dL (ref 6–20)
CO2: 25 mmol/L (ref 22–32)
Calcium: 9.3 mg/dL (ref 8.9–10.3)
Chloride: 105 mmol/L (ref 98–111)
Creatinine, Ser: 0.62 mg/dL (ref 0.44–1.00)
GFR, Estimated: >60 mL/min (ref >60)
Glucose, Bld: 90 mg/dL (ref 70–99)
Potassium: 3.8 mmol/L (ref 3.5–5.1)
Sodium: 137 mmol/L (ref 135–145)

## 2021-01-23 LAB — TROPONIN I (HIGH SENSITIVITY)
Troponin I (High Sensitivity): 4 ng/L (ref <18)
Troponin I (High Sensitivity): 5 ng/L (ref <18)

## 2021-01-23 LAB — CBC
HCT: 42.4 % (ref 36.0–46.0)
Hemoglobin: 13.9 g/dL (ref 12.0–15.0)
MCH: 29.6 pg (ref 26.0–34.0)
MCHC: 32.8 g/dL (ref 30.0–36.0)
MCV: 90.4 fL (ref 80.0–100.0)
Platelets: 280 10*3/uL (ref 150–400)
RBC: 4.69 MIL/uL (ref 3.87–5.11)
RDW: 12.8 % (ref 11.5–15.5)
WBC: 4.7 10*3/uL (ref 4.0–10.5)
nRBC: 0 % (ref 0.0–0.2)

## 2021-01-23 LAB — I-STAT BETA HCG BLOOD, ED (MC, WL, AP ONLY): I-stat hCG, quantitative: <5 m[IU]/mL (ref <5)

## 2021-01-23 LAB — RESP PANEL BY RT-PCR (FLU A&B, COVID) ARPGX2
Influenza A by PCR: NEGATIVE
Influenza B by PCR: NEGATIVE
SARS Coronavirus 2 by RT PCR: NEGATIVE

## 2021-01-23 MED ORDER — IOHEXOL 350 MG/ML SOLN
100.0000 mL | Freq: Once | INTRAVENOUS | Status: AC | PRN
  Administered 2021-01-23: 100 mL via INTRAVENOUS

## 2021-01-23 MED ORDER — TRIAMTERENE-HCTZ 37.5-25 MG PO TABS
0.5000 | ORAL_TABLET | Freq: Every day | ORAL | Status: DC
  Administered 2021-01-23: 0.5 via ORAL
  Filled 2021-01-23: qty 1

## 2021-01-23 MED ORDER — CARVEDILOL 3.125 MG PO TABS
3.1250 mg | ORAL_TABLET | Freq: Once | ORAL | Status: AC
  Administered 2021-01-23: 3.125 mg via ORAL
  Filled 2021-01-23: qty 1

## 2021-01-23 NOTE — Discharge Instructions (Signed)
Your work-up today was overall reassuring.  Your CT scan did not show any concerning findings with your aorta, your heart, your lungs, or the abdomen or pelvis.  We did find the right-sided kidney stone which we discussed.  We did not see any other concerning findings going through all the images together.  Please follow-up with a PCP, your cardiologist, and likely a gastroenterologist as well.  Please rest and stay hydrated.  If any symptoms change or worsen, please return to the nearest Emergency Department.

## 2021-01-23 NOTE — Telephone Encounter (Signed)
Called patient to notify of CT scan and patient stated she was having Chest pain radiating from center front to back and had experienced numbness in right hand advised patient she needs to be evaluated at [facility] or ER.  Patient was in cafeteria at [facility] she was going to ER.

## 2021-01-23 NOTE — ED Provider Notes (Signed)
3:35 PM Care assumed from Dr. Higinio Plan and Dr. Maryan Rued.  At time of transfer of care, patient is awaiting for CT dissection study due to pain in her chest and back with known aortic troubles.  Delta troponin is already negative.  Plan of care is to discharge if work-up is reassuring.  Blood pressure is improved after home blood pressure medicine reportedly.  5:59 PM Work-up returned and is overall reassuring.  COVID and flu negative.  Labs showed negative delta troponin.  CT scan returned showing no evidence of acute abnormality in the chest/abdomen/pelvis with specifically no aneurysm or dissection.  Patient does have a right-sided kidney stone that is still in the kidney not causing any problems we informed her of this.  We also went through all the images together and did not find any concerning discoveries.  Patient will follow-up with PCP as well as cardiology and a gastroenterologist given her epigastric discomfort that could be a gastric type pain.  Patient agrees with plan of care and discharge and will rest and stay hydrated.  She had no questions or concerns and was discharged in good condition.  Clinical Impression: 1. Chest pain, unspecified type   2. Epigastric pain     Disposition: Discharge  Condition: Good  I have discussed the results, Dx and Tx plan with the pt(& family if present). He/she/they expressed understanding and agree(s) with the plan. Discharge instructions discussed at great length. Strict return precautions discussed and pt &/or family have verbalized understanding of the instructions. No further questions at time of discharge.    New Prescriptions   No medications on file    Follow Up: McLean-Scocuzza, Nino Glow, MD [place] [address] [phone]     [place] [address] [id number] [place] [phone]    [facility] [place] [id number] [phone]          Dafney Farler, Gwenyth Allegra, MD 01/23/21 [phone]

## 2021-01-23 NOTE — ED Triage Notes (Signed)
Pt reports while at work today had chest pain described as tightness, as well as a pain radiating to her shoulder blades. Denies SOB, N/V/diaphoresis.

## 2021-01-23 NOTE — ED Provider Notes (Signed)
[facility]ERGENC[facility] DEPARTMENT Provider Note   CSN: [id number] Arrival date & time: 01/23/21  1216     History Chief Complaint  Patient presents with  . Chest Pain    [name] is a 50 y.o. female, with a history of hypertension, hyperlipidemia, and mild to moderate aortic valve regurgitation, presenting for evaluation of acute chest pain.  She reports initially she started having upper back pain in between her shoulder blades yesterday.  However, she slept on the couch the night before and attributed to this.  Unable to describe pain "just hurts ".  Then today around 1130-1140, she had an approximate 10-minute episode of severe substernal chest squeezing/tightness.  After this time it dissipated some, however she still feels a pressure-like sensation at rest, worse when she takes in a deep breath.    She was currently working at the time, works for [employer], and they sent her for evaluation.  Pain did not radiate, no associated diaphoresis, shortness of breath, palpitations, arm numbness/tingling.  No previous MI, had a CT coronary in 11/2018 with a 0 calcium score however had 10-20% soft plaque in the mid LAD.  Follows with cardiology for her regurgitation, has an upcoming repeat echo.  Denies any fever, chills, cough, myalgias, or congestion.  No personal history or significant family history of DVT/PE. No injury or trauma. Former smoker.   Living when she lives with recently had [place].  She was tested last Monday and Saturday and tested negative, however she would like to recheck to make sure.    She has not taking her Coreg or Maxide yet today for blood pressure.     Past Medical History:  Diagnosis Date  . Allergy   . Anemia    past hx - corrected after hysterectomy   . Anxiety    past hx   . Aortic insufficiency   . ASCUS favoring benign 12/2015   negative high risk HPV  . Chicken pox   . COVID-19 december2020, 09-13-2020  . GERD  (gastroesophageal reflux disease)    during pregnancy   . 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 Hiatal hernia   . Hyperlipidemia   . Hypertension    during pregnancy   . 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 Lichen planopilaris    scalp  . OSA (obstructive sleep apnea)   . Parapelvic renal cyst   . Reflux   . Sleep apnea     Patient Active Problem List   Diagnosis Date Noted  . Hiatal hernia 01/15/2021  . OSA (obstructive sleep apnea) 01/15/2021  . Lip lesion 01/15/2021  . Obesity (BMI 30-39.9) 07/19/2020  . Lichen planopilaris   . Chronic pain of left ankle 12/21/2019  . Allergic conjunctivitis 12/10/2019  . Chronic pain of right ankle 10/29/2019  . Left leg pain 10/29/2019  . Aortic valve regurgitation 06/24/2019  . Urinary incontinence 06/24/2019  . Parapelvic renal cyst 06/24/2019  . Chronic shoulder bursitis, left 06/24/2019  . Arthritis 06/24/2019  . Essential hypertension 03/18/2019  . Mid back pain 03/18/2019  . Muscle weakness (generalized) 03/18/2019  . Pleuritic chest pain 03/18/2019  . Menorrhagia 08/10/2018  . Vitamin D deficiency 12/17/2017  . Iron deficiency anemia 11/13/2017  . Alopecia 11/03/2017  . Seborrheic dermatitis of scalp 11/03/2017  . Cyst of ovary 11/03/2017  . Atypical squamous cells of undetermined significance on cytologic smear of cervix (ASC-US) 11/03/2017  .  Right foot pain 11/03/2017  . Thrombocytosis 10/31/2017  . Leg edema, left 06/11/2013  . Petechiae 06/11/2013  . LGSIL (low grade squamous intraepithelial dysplasia)   . Reflux   . Hyperlipidemia 04/23/2007  . GAD (generalized anxiety disorder) 04/23/2007  . DEPRESSION 04/23/2007  . GERD 04/23/2007  . GESTATIONAL DIABETES 04/23/2007  . DE QUERVAIN'S TENOSYNOVITIS 04/23/2007    Past Surgical History:  Procedure Laterality Date  . COLPOSCOPY    . LAPAROSCOPIC VAGINAL HYSTERECTOMY WITH SALPINGO OOPHORECTOMY Bilateral 08/10/2018    Procedure: LAPAROSCOPIC ASSISTED VAGINAL HYSTERECTOMY WITH BILATERAL SALPINGO OOPHORECTOMY;  Surgeon: [name], MD;  Location: [facility];  Service: Gynecology;  Laterality: Bilateral;  . TONSILLECTOMY AND ADENOIDECTOMY       OB History    Gravida  2   Para  2   Term  2   Preterm      AB      Living  2     SAB      IAB      Ectopic      Multiple      Live Births              Family History  Problem Relation Age of Onset  . Hypertension Mother   . Cancer Mother        LUNG CANCER/SMOKER  . Depression Mother   . Mental illness Mother   . Lung cancer Mother   . Liver cancer Mother   . Diabetes Father   . Hypertension Father   . Heart disease Father   . Hyperlipidemia Father   . Stroke Father   . Hypertension Brother   . Diabetes Brother   . Depression Brother   . Hyperlipidemia Brother   . Cancer Maternal Uncle        ESOPHAGEAL CA  . Esophageal cancer Maternal Uncle   . Diabetes Cousin   . Heart disease Cousin   . Cancer Maternal Grandfather        Brain  . Brain cancer Maternal Grandfather   . Stroke Paternal Grandmother   . Depression Maternal Grandmother   . Mental retardation Maternal Grandmother   . Stroke Paternal Grandfather   . Colon cancer Neg Hx   . Colon polyps Neg Hx   . Rectal cancer Neg Hx   . Stomach cancer Neg Hx     Social History   Tobacco Use  . Smoking status: Former Smoker    Types: Cigarettes    Quit date: 09/02/1990    Years since quitting: 30.4  . Smokeless tobacco: Never Used  . Tobacco comment: smoked socially in high school   Vaping Use  . Vaping Use: Never used  Substance Use Topics  . Alcohol use: Not Currently    Alcohol/week: 0.0 standard drinks    Comment: occasionally   . Drug use: No    Home Medications Prior to Admission medications   Medication Sig Start Date End Date Taking? Authorizing Provider  aspirin EC 81 MG tablet Take 81 mg by mouth daily. Swallow whole.     [provider]  carvedilol (COREG) 3.125 MG tablet TAKE 1 TABLET (3.125 MG TOTAL) BY MOUTH 2 (TWO) TIMES DAILY WITH A MEAL. 04/07/20 04/07/21  McLean-Scocuzza, Nino Glow, MD  cetirizine (ZYRTEC ALLERGY) 10 MG tablet Take 1 tablet (10 mg total) by mouth daily. Patient not taking: Reported on 01/12/2021 11/14/20   Jaynee Eagles, PA-C  clobetasol (TEMOVATE) 0.05 % external solution SMARTSIG:1 Milliliter(s) Topical Daily 02/15/20  [provider]  cyclobenzaprine (FLEXERIL) 5 MG tablet TAKE 1 TABLET (5 MG TOTAL) BY MOUTH AT BEDTIME AS NEEDED FOR MUSCLE SPASMS. 07/14/20 07/14/21  McLean-Scocuzza, Nino Glow, MD  famotidine (PEPCID) 20 MG tablet TAKE 1 TABLET BY MOUTH TWICE DAILY AS NEEDED FOR REFLUX. 07/14/20 07/14/21  McLean-Scocuzza, Nino Glow, MD  hydroxychloroquine (PLAQUENIL) 200 MG tablet Take 200 mg by mouth 2 (two) times daily. 02/23/20   [provider]  rosuvastatin (CRESTOR) 20 MG tablet TAKE 1 TABLET (20 MG TOTAL) BY MOUTH DAILY. Patient not taking: Reported on 01/12/2021 05/12/20 05/12/21  Pixie Casino, MD  triamterene-hydrochlorothiazide (MAXZIDE-25) 37.5-25 MG tablet TAKE 1/2 TABLET BY MOUTH EVERY MORNING.(STOP HCTZ 12.5 ALONE DOSE) 04/07/20 04/07/21  McLean-Scocuzza, Nino Glow, MD  VITAMIN D PO Take 5,000 Units by mouth daily.    [provider]    Allergies    Codeine, Crestor [rosuvastatin], Erythromycin, and Levofloxacin  Review of Systems   Review of Systems  Constitutional: Negative for fever.  HENT: Negative for congestion.   Respiratory: Positive for chest tightness. Negative for cough and shortness of breath.   Cardiovascular: Positive for chest pain. Negative for palpitations and leg swelling.  Gastrointestinal: Positive for abdominal pain. Negative for nausea and vomiting.       Chronic and unchanged   Genitourinary: Negative for dysuria and flank pain.  Musculoskeletal: Positive for back pain and gait problem.  Skin: Negative for color change and  pallor.  Neurological: Negative for dizziness, syncope, weakness, light-headedness and numbness.    Physical Exam Updated Vital Signs BP (!) 157/105 (BP Location: Left Arm)   Pulse (!) 59   Temp 97.6 F (36.4 C) (Oral)   Resp 16   Ht 5\' 2"  (1.575 m)   Wt 74.4 kg   LMP 08/02/2018 (Exact Date)   SpO2 100%   BMI 30.00 kg/m   Physical Exam Constitutional:      General: She is not in acute distress.    Appearance: She is well-developed. She is not diaphoretic.  HENT:     Head: Normocephalic and atraumatic.  Cardiovascular:     Rate and Rhythm: Normal rate and regular rhythm.     Pulses:          Radial pulses are 2+ on the right side and 2+ on the left side.       Dorsalis pedis pulses are 2+ on the right side and 2+ on the left side.     Heart sounds: Normal heart sounds.   No systolic murmur is present.   Pulmonary:     Effort: Pulmonary effort is normal. No tachypnea or accessory muscle usage.     Breath sounds: Normal breath sounds.  Chest:     Chest wall: No deformity, tenderness or crepitus.  Abdominal:     Palpations: Abdomen is soft.  Musculoskeletal:     Cervical back: Neck supple.     Right lower leg: No edema.     Left lower leg: No edema.     Comments: Nontender to palpation of thoracic spinous processes and paraspinal musculature.  Full thoracic ROM without pain.  Skin:    General: Skin is warm and dry.     Capillary Refill: Capillary refill takes less than 2 seconds.  Neurological:     General: No focal deficit present.     Mental Status: She is alert and oriented to person, place, and time.  Psychiatric:        Mood and Affect: Mood normal.  Behavior: Behavior normal.     ED Results / Procedures / Treatments   Labs (all labs ordered are listed, but only abnormal results are displayed) Labs Reviewed  RESP PANEL BY RT-PCR (FLU A&B, COVID) [id number]  BASIC METABOLIC PANEL  CBC  I-STAT BETA HCG BLOOD, ED ([facility], [facility], [facility] ONLY)  TROPONIN I (HIGH  SENSITIVITY)  TROPONIN I (HIGH SENSITIVITY)    EKG EKG Interpretation  Date/Time:  Tuesday Jan 23 2021 12:41:59 EDT Ventricular Rate:  62 PR Interval:  122 QRS Duration: 100 QT Interval:  418 QTC Calculation: 424 R Axis:   39 Text Interpretation: Normal sinus rhythm Nonspecific ST and T wave abnormality No significant change since last tracing Confirmed by Blanchie Dessert [phone]) on 01/23/2021 2:05:00 PM   Radiology DG Chest 2 View  Result Date: 01/23/2021 CLINICAL DATA:  Chest pain EXAM: CHEST - 2 VIEW COMPARISON:  None. FINDINGS: The heart size and mediastinal contours are within normal limits. Both lungs are clear. No pleural effusion or pneumothorax. The visualized skeletal structures are unremarkable. IMPRESSION: No acute process in the chest. Electronically Signed   By: Macy Mis M.D.   On: 01/23/2021 13:38    Procedures Procedures   Medications Ordered in ED Medications  triamterene-hydrochlorothiazide (MAXZIDE-25) 37.5-25 MG per tablet 0.5 tablet (0.5 tablets Oral Given 01/23/21 1558)  carvedilol (COREG) tablet 3.125 mg (3.125 mg Oral Given 01/23/21 1558)    ED Course  I have reviewed the triage vital signs and the nursing notes.  Pertinent labs & imaging results that were available during my care of the patient were reviewed by me and considered in my medical decision making (see chart for details).    MDM Rules/Calculators/A&P                          50 year old female, with a history of elevated BMI, HTN, HLD, and mild-moderate aortic regurgitation, presenting for evaluation of worsening upper thoracic back pain since yesterday and substernal chest pressure since earlier today.  Hypertensive on arrival to 183/90, however had not taken antihypertensives yet, otherwise hemodynamically stable.  Given her description of current pain and numerous risk factors, will proceed with CT chest/abdomen/pelvis to rule out aortic dissection.  CBC and BMP noncontributory.   Reassured the EKG normal sinus rhythm with nonspecific ST changes similar to previous and troponin 4>5.  Less suspicious for MI, especially given normal calcium score/low risk CT coronary in 2020 with findings as above.  Fortunately breathing comfortably without tachycardia or oxygen desaturations, less concern for PE, Wells score for PE 0.  CXR clear without widened mediastinum or infiltrate, doubt PNA.  Given home Coreg and Maxide for blood pressure control.  COVID swab ordered per patient request given recent exposure, no additional findings concerning for COVID.  Dissection study ordered and pending.  Signed out to Dr. Sherry Ruffing at 1530, disposition pending results.  If normal, had planned to discharge home.    Patriciaann Clan, DO 01/23/21 1604    Blanchie Dessert, MD 01/24/21 1015

## 2021-01-23 NOTE — Telephone Encounter (Signed)
Rec ED for now  She also has cardiology

## 2021-01-25 ENCOUNTER — Other Ambulatory Visit (HOSPITAL_COMMUNITY): Payer: Self-pay

## 2021-01-25 ENCOUNTER — Telehealth: Payer: Self-pay | Admitting: Internal Medicine

## 2021-01-25 MED FILL — Triamterene & Hydrochlorothiazide Tab 37.5-25 MG: ORAL | 90 days supply | Qty: 45 | Fill #0 | Status: CN

## 2021-01-25 NOTE — Telephone Encounter (Signed)
lft vm for pt to call ofc to inform if she needs to have the CT abdomen. It does look like in chart she's had the CT chest/abdomen. Thanks

## 2021-01-25 NOTE — Telephone Encounter (Signed)
For your information  

## 2021-01-25 NOTE — Telephone Encounter (Signed)
PT called in to return the missed call about referral for CT. Want to inform Elvina Mattes that she had one Tuesday when she went to the ER for chest pain where they had a chest/abd/pel. She also wanted to find out if she still needs one.

## 2021-01-30 ENCOUNTER — Other Ambulatory Visit (HOSPITAL_COMMUNITY): Admission: RE | Admit: 2021-01-30 | Payer: 59 | Source: Ambulatory Visit

## 2021-01-30 ENCOUNTER — Telehealth: Payer: Self-pay | Admitting: Internal Medicine

## 2021-01-30 ENCOUNTER — Other Ambulatory Visit (HOSPITAL_COMMUNITY): Payer: Self-pay

## 2021-01-30 NOTE — Telephone Encounter (Addendum)
Patient called in wanted her lab order faxed to [facility] [phone] Phone number [phone] if any questions

## 2021-01-31 NOTE — Telephone Encounter (Signed)
Printed and sent to fax

## 2021-02-01 ENCOUNTER — Encounter: Payer: Self-pay | Admitting: Nurse Practitioner

## 2021-02-01 ENCOUNTER — Other Ambulatory Visit (HOSPITAL_BASED_OUTPATIENT_CLINIC_OR_DEPARTMENT_OTHER): Payer: Self-pay

## 2021-02-02 ENCOUNTER — Ambulatory Visit (HOSPITAL_COMMUNITY): Payer: 59 | Attending: Cardiology

## 2021-02-02 ENCOUNTER — Other Ambulatory Visit: Payer: Self-pay

## 2021-02-02 DIAGNOSIS — I351 Nonrheumatic aortic (valve) insufficiency: Secondary | ICD-10-CM | POA: Diagnosis not present

## 2021-02-02 LAB — ECHOCARDIOGRAM COMPLETE
Area-P 1/2: 3.65 cm2
P 1/2 time: 490 msec
S' Lateral: 2.5 cm

## 2021-02-07 ENCOUNTER — Other Ambulatory Visit (HOSPITAL_COMMUNITY): Payer: Self-pay

## 2021-02-19 ENCOUNTER — Other Ambulatory Visit (HOSPITAL_COMMUNITY): Payer: Self-pay

## 2021-02-19 MED FILL — Famotidine Tab 20 MG: ORAL | 30 days supply | Qty: 60 | Fill #1 | Status: AC

## 2021-03-19 NOTE — Telephone Encounter (Signed)
Spoke with patient who reports pain in her side when she was on crestor. Also reports bloating. She also states one of her liver enzymes was elevated but no recent labs regarding this.   She would like to know what next steps should be with cholesterol management.  She has not had a lipid check since 12/2019 but PCP ordered a bunch of lab work and patient plans to get this soon.   She reports right shoulder pain that goes up into her neck. Happens with exertion. Occurs mostly when she is active - walking and moving in general. She also reports recent stress d/t a move.

## 2021-03-20 ENCOUNTER — Other Ambulatory Visit (HOSPITAL_COMMUNITY): Payer: Self-pay

## 2021-03-20 MED FILL — Carvedilol Tab 3.125 MG: ORAL | 90 days supply | Qty: 180 | Fill #1 | Status: AC

## 2021-03-21 ENCOUNTER — Other Ambulatory Visit (HOSPITAL_COMMUNITY): Payer: Self-pay

## 2021-03-21 MED FILL — Cyclobenzaprine HCl Tab 5 MG: ORAL | 90 days supply | Qty: 90 | Fill #0 | Status: AC

## 2021-04-01 ENCOUNTER — Emergency Department (HOSPITAL_COMMUNITY)
Admission: EM | Admit: 2021-04-01 | Discharge: 2021-04-02 | Disposition: A | Discharge status: Home / Self Care | Payer: 59 | Attending: Emergency Medicine | Admitting: Emergency Medicine

## 2021-04-01 DIAGNOSIS — Y92009 Unspecified place in unspecified non-institutional (private) residence as the place of occurrence of the external cause: Secondary | ICD-10-CM | POA: Insufficient documentation

## 2021-04-01 DIAGNOSIS — S99921A Unspecified injury of right foot, initial encounter: Secondary | ICD-10-CM | POA: Diagnosis present

## 2021-04-01 DIAGNOSIS — W268XXA Contact with other sharp object(s), not elsewhere classified, initial encounter: Secondary | ICD-10-CM | POA: Insufficient documentation

## 2021-04-01 DIAGNOSIS — S91331A Puncture wound without foreign body, right foot, initial encounter: Secondary | ICD-10-CM | POA: Diagnosis not present

## 2021-04-01 DIAGNOSIS — Z79899 Other long term (current) drug therapy: Secondary | ICD-10-CM | POA: Insufficient documentation

## 2021-04-01 DIAGNOSIS — Z23 Encounter for immunization: Secondary | ICD-10-CM | POA: Diagnosis not present

## 2021-04-01 DIAGNOSIS — Z8616 Personal history of COVID-19: Secondary | ICD-10-CM | POA: Diagnosis not present

## 2021-04-01 DIAGNOSIS — Z87891 Personal history of nicotine dependence: Secondary | ICD-10-CM | POA: Insufficient documentation

## 2021-04-01 DIAGNOSIS — I10 Essential (primary) hypertension: Secondary | ICD-10-CM | POA: Insufficient documentation

## 2021-04-02 ENCOUNTER — Other Ambulatory Visit: Payer: Self-pay

## 2021-04-02 ENCOUNTER — Encounter (HOSPITAL_COMMUNITY): Payer: Self-pay | Admitting: Emergency Medicine

## 2021-04-02 DIAGNOSIS — S91331A Puncture wound without foreign body, right foot, initial encounter: Secondary | ICD-10-CM | POA: Diagnosis not present

## 2021-04-02 DIAGNOSIS — I10 Essential (primary) hypertension: Secondary | ICD-10-CM | POA: Diagnosis not present

## 2021-04-02 DIAGNOSIS — Z79899 Other long term (current) drug therapy: Secondary | ICD-10-CM | POA: Diagnosis not present

## 2021-04-02 DIAGNOSIS — Z87891 Personal history of nicotine dependence: Secondary | ICD-10-CM | POA: Diagnosis not present

## 2021-04-02 DIAGNOSIS — Z23 Encounter for immunization: Secondary | ICD-10-CM | POA: Diagnosis not present

## 2021-04-02 DIAGNOSIS — Z8616 Personal history of COVID-19: Secondary | ICD-10-CM | POA: Diagnosis not present

## 2021-04-02 MED ORDER — TETANUS-DIPHTH-ACELL PERTUSSIS 5-2.5-18.5 LF-MCG/0.5 IM SUSY
0.5000 mL | PREFILLED_SYRINGE | Freq: Once | INTRAMUSCULAR | Status: AC
  Administered 2021-04-02: 0.5 mL via INTRAMUSCULAR
  Filled 2021-04-02: qty 0.5

## 2021-04-02 NOTE — ED Triage Notes (Signed)
Patient stepped on something in her house that was rusty and sharp. Patient states she needs a tetanus.

## 2021-04-02 NOTE — ED Provider Notes (Signed)
[facility] DEPT Provider Note   CSN: [id number] Arrival date & time: 04/01/21  2315     History Chief Complaint  Patient presents with   Foot Problem    [name] is a 50 y.o. female.  The history is provided by the patient and medical records.   50 year old female with history of seasonal allergies, anemia, GERD, hyperlipidemia, hypertension, acid reflux, presenting to the ED with puncture wound to right sole of foot.  States she was in her shower which is somewhat rested in the bottom and something poked into the ball of her right foot.  She was able to remove this fully without issue.  She remains ambulatory.  She wants an updated tetanus.  Past Medical History:  Diagnosis Date   Allergy    Anemia    past hx - corrected after hysterectomy    Anxiety    past hx    Aortic insufficiency    ASCUS favoring benign 12/2015   negative high risk HPV   Chicken pox    COVID-19 december2020, 09-13-2020   GERD (gastroesophageal reflux disease)    during pregnancy    Headache    frequent after MCA 2018    Hiatal hernia    Hyperlipidemia    Hypertension    during pregnancy    LGSIL (low grade squamous intraepithelial dysplasia)    [id number]   Lichen planopilaris    scalp   OSA (obstructive sleep apnea)    Parapelvic renal cyst    Reflux    Sleep apnea     Patient Active Problem List   Diagnosis Date Noted   Hiatal hernia 01/15/2021   OSA (obstructive sleep apnea) 01/15/2021   Lip lesion 01/15/2021   Obesity (BMI 0000000) [id number]   Lichen planopilaris    Chronic pain of left ankle 12/21/2019   Allergic conjunctivitis 12/10/2019   Chronic pain of right ankle 10/29/2019   Left leg pain 10/29/2019   Aortic valve regurgitation 06/24/2019   Urinary incontinence 06/24/2019   Parapelvic renal cyst 06/24/2019   Chronic shoulder bursitis, left 06/24/2019   Arthritis 06/24/2019   Essential hypertension 03/18/2019   Mid back pain  03/18/2019   Muscle weakness (generalized) 03/18/2019   Pleuritic chest pain 03/18/2019   Menorrhagia 08/10/2018   Vitamin D deficiency 12/17/2017   Iron deficiency anemia 11/13/2017   Alopecia 11/03/2017   Seborrheic dermatitis of scalp 11/03/2017   Cyst of ovary 11/03/2017   Atypical squamous cells of undetermined significance on cytologic smear of cervix (ASC-US) 11/03/2017   Right foot pain 11/03/2017   Thrombocytosis 10/31/2017   Leg edema, left 06/11/2013   Petechiae 06/11/2013   LGSIL (low grade squamous intraepithelial dysplasia)    Reflux    Hyperlipidemia 04/23/2007   GAD (generalized anxiety disorder) 04/23/2007   DEPRESSION 04/23/2007   GERD 04/23/2007   GESTATIONAL DIABETES 04/23/2007   DE QUERVAIN'S TENOSYNOVITIS 04/23/2007    Past Surgical History:  Procedure Laterality Date   COLPOSCOPY     LAPAROSCOPIC VAGINAL HYSTERECTOMY WITH SALPINGO OOPHORECTOMY Bilateral 08/10/2018   Procedure: LAPAROSCOPIC ASSISTED VAGINAL HYSTERECTOMY WITH BILATERAL SALPINGO OOPHORECTOMY;  Surgeon: [name], MD;  Location: [facility];  Service: Gynecology;  Laterality: Bilateral;   TONSILLECTOMY AND ADENOIDECTOMY       OB History     Gravida  2   Para  2   Term  2   Preterm      AB      Living  2  SAB      IAB      Ectopic      Multiple      Live Births              Family History  Problem Relation Age of Onset   Hypertension Mother    Cancer Mother        LUNG CANCER/SMOKER   Depression Mother    Mental illness Mother    Lung cancer Mother    Liver cancer Mother    Diabetes Father    Hypertension Father    Heart disease Father    Hyperlipidemia Father    Stroke Father    Hypertension Brother    Diabetes Brother    Depression Brother    Hyperlipidemia Brother    Cancer Maternal Uncle        ESOPHAGEAL CA   Esophageal cancer Maternal Uncle    Diabetes Cousin    Heart disease Cousin    Cancer Maternal Grandfather         Brain   Brain cancer Maternal Grandfather    Stroke Paternal Grandmother    Depression Maternal Grandmother    Mental retardation Maternal Grandmother    Stroke Paternal Grandfather    Colon cancer Neg Hx    Colon polyps Neg Hx    Rectal cancer Neg Hx    Stomach cancer Neg Hx     Social History   Tobacco Use   Smoking status: Former    Types: Cigarettes    Quit date: 09/02/1990    Years since quitting: 30.6   Smokeless tobacco: Never   Tobacco comments:    smoked socially in high school   Vaping Use   Vaping Use: Never used  Substance Use Topics   Alcohol use: Not Currently    Alcohol/week: 0.0 standard drinks    Comment: occasionally    Drug use: No    Home Medications Prior to Admission medications   Medication Sig Start Date End Date Taking? Authorizing Provider  carvedilol (COREG) 3.125 MG tablet TAKE 1 TABLET (3.125 MG TOTAL) BY MOUTH 2 (TWO) TIMES DAILY WITH A MEAL. 04/07/20 06/19/21 Yes McLean-Scocuzza, Nino Glow, MD  clobetasol (TEMOVATE) 0.05 % external solution SMARTSIG:1 Milliliter(s) Topical Daily 02/15/20  Yes [provider]  cyclobenzaprine (FLEXERIL) 5 MG tablet TAKE 1 TABLET (5 MG TOTAL) BY MOUTH AT BEDTIME AS NEEDED FOR MUSCLE SPASMS. 07/14/20 07/14/21 Yes McLean-Scocuzza, Nino Glow, MD  famotidine (PEPCID) 20 MG tablet TAKE 1 TABLET BY MOUTH TWICE DAILY AS NEEDED FOR REFLUX. 07/14/20 07/14/21 Yes McLean-Scocuzza, Nino Glow, MD  triamterene-hydrochlorothiazide (MAXZIDE-25) 37.5-25 MG tablet TAKE 1/2 TABLET BY MOUTH EVERY MORNING.(STOP HCTZ 12.5 ALONE DOSE) Patient taking differently: Take 0.5-1 tablets by mouth daily. 04/07/20 04/30/21 Yes McLean-Scocuzza, Nino Glow, MD  hydroxychloroquine (PLAQUENIL) 200 MG tablet Take 200 mg by mouth 2 (two) times daily. 02/23/20   [provider]    Allergies    Codeine, Crestor [rosuvastatin], Erythromycin, and Levofloxacin  Review of Systems   Review of Systems  Skin:  Positive for wound.  All other  systems reviewed and are negative.  Physical Exam Updated Vital Signs BP (!) 155/95   Pulse 63   Temp 98.5 F (36.9 C) (Oral)   Resp 17   Ht '5\' 2"'$  (1.575 m)   Wt 74.4 kg   LMP 08/02/2018 (Exact Date)   SpO2 100%   BMI 30.00 kg/m   Physical Exam Vitals and nursing note reviewed.  Constitutional:  Appearance: She is well-developed.  HENT:     Head: Normocephalic and atraumatic.  Eyes:     Conjunctiva/sclera: Conjunctivae normal.     Pupils: Pupils are equal, round, and reactive to light.  Cardiovascular:     Rate and Rhythm: Normal rate and regular rhythm.     Heart sounds: Normal heart sounds.  Pulmonary:     Effort: Pulmonary effort is normal. No respiratory distress.     Breath sounds: Normal breath sounds. No rhonchi.  Abdominal:     General: Bowel sounds are normal.     Palpations: Abdomen is soft.     Tenderness: There is no abdominal tenderness. There is no rebound.  Musculoskeletal:        General: Normal range of motion.     Cervical back: Normal range of motion.     Comments: Tiny puncture wound noted to ball of right foot, no surrounding swelling/induration, no retained foreign body noted  Skin:    General: Skin is warm and dry.  Neurological:     Mental Status: She is alert and oriented to person, place, and time.    ED Results / Procedures / Treatments   Labs (all labs ordered are listed, but only abnormal results are displayed) Labs Reviewed - No data to display  EKG None  Radiology No results found.  Procedures Procedures   Medications Ordered in ED Medications  Tdap (BOOSTRIX) injection 0.5 mL (0.5 mLs Intramuscular Given 04/02/21 0044)    ED Course  I have reviewed the triage vital signs and the nursing notes.  Pertinent labs & imaging results that were available during my care of the patient were reviewed by me and considered in my medical decision making (see chart for details).    MDM Rules/Calculators/A&P                            50 year old female presenting to the ED with puncture wound of right foot while she was in her shower.  Does admit area is rusted with some pieces of sharp metal.  She was able to remove it is fully, there is no retained foreign body on my exam and no signs of superimposed infection.  Tetanus will be updated.  Encouraged home wound care.  Follow-up with primary care doctor for any ongoing issues.  Return for new concerns.  Final Clinical Impression(s) / ED Diagnoses Final diagnoses:  Puncture wound of right foot, initial encounter    Rx / DC Orders ED Discharge Orders     None        Larene Pickett, PA-C 04/02/21 0050    Quintella Reichert, MD 04/02/21 [id number]

## 2021-04-02 NOTE — Discharge Instructions (Addendum)
Tetanus has been updated.  Keep foot clean with soap and warm water. Follow-up with your primary care doctor if ongoing issues.

## 2021-04-06 ENCOUNTER — Other Ambulatory Visit (HOSPITAL_COMMUNITY): Payer: Self-pay

## 2021-04-06 MED FILL — Triamterene & Hydrochlorothiazide Tab 37.5-25 MG: ORAL | 90 days supply | Qty: 45 | Fill #0 | Status: AC

## 2021-05-03 DIAGNOSIS — R29898 Other symptoms and signs involving the musculoskeletal system: Secondary | ICD-10-CM | POA: Diagnosis not present

## 2021-05-04 ENCOUNTER — Other Ambulatory Visit (HOSPITAL_COMMUNITY): Payer: Self-pay

## 2021-05-04 MED FILL — Famotidine Tab 20 MG: ORAL | 30 days supply | Qty: 60 | Fill #2 | Status: AC

## 2021-05-17 ENCOUNTER — Other Ambulatory Visit (HOSPITAL_COMMUNITY): Payer: Self-pay

## 2021-05-17 ENCOUNTER — Telehealth: Payer: Self-pay

## 2021-05-17 MED ORDER — DOXYCYCLINE HYCLATE 100 MG PO TABS
100.0000 mg | ORAL_TABLET | Freq: Two times a day (BID) | ORAL | 0 refills | Status: DC
  Filled 2021-05-17: qty 20, 10d supply, fill #0

## 2021-05-17 NOTE — Telephone Encounter (Signed)
Needs to schedule follow up with anyone in office in 2 weeks

## 2021-05-17 NOTE — Telephone Encounter (Signed)
Spoken to patient Per on call physician, due to receiving a fax with a positive result of [facility].Spotted Tick Fever from [place]. Patient informed us that [place].Spotted Tick Fever was going to have to be completed by PCP office due to patient following up with PCP office. Patient would like rx to be sent to [facility].

## 2021-05-17 NOTE — Telephone Encounter (Signed)
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  Doxycline sent to [facility] pharmacy,  take with food to avoid nausea.

## 2021-05-17 NOTE — Telephone Encounter (Signed)
Left detailed message to inform patient and to return call back to schedule 2 week Follow up appointment.

## 2021-05-17 NOTE — Addendum Note (Signed)
Addended by: Crecencio Mc on: 05/17/2021 05:21 PM   Modules accepted: Orders

## 2021-05-26 DIAGNOSIS — R29898 Other symptoms and signs involving the musculoskeletal system: Secondary | ICD-10-CM | POA: Diagnosis not present

## 2021-05-26 DIAGNOSIS — R531 Weakness: Secondary | ICD-10-CM | POA: Diagnosis not present

## 2021-06-06 ENCOUNTER — Ambulatory Visit (INDEPENDENT_AMBULATORY_CARE_PROVIDER_SITE_OTHER): Payer: 59 | Admitting: Internal Medicine

## 2021-06-06 ENCOUNTER — Other Ambulatory Visit: Payer: Self-pay

## 2021-06-06 VITALS — BP 122/72 | HR 63 | Temp 97.6°F | Resp 16 | Ht 62.5 in | Wt 166.8 lb

## 2021-06-06 DIAGNOSIS — I10 Essential (primary) hypertension: Secondary | ICD-10-CM | POA: Diagnosis not present

## 2021-06-06 DIAGNOSIS — D508 Other iron deficiency anemias: Secondary | ICD-10-CM

## 2021-06-06 DIAGNOSIS — Z1329 Encounter for screening for other suspected endocrine disorder: Secondary | ICD-10-CM | POA: Diagnosis not present

## 2021-06-06 DIAGNOSIS — Z8619 Personal history of other infectious and parasitic diseases: Secondary | ICD-10-CM

## 2021-06-06 DIAGNOSIS — Z1389 Encounter for screening for other disorder: Secondary | ICD-10-CM

## 2021-06-06 DIAGNOSIS — G4733 Obstructive sleep apnea (adult) (pediatric): Secondary | ICD-10-CM

## 2021-06-06 DIAGNOSIS — E785 Hyperlipidemia, unspecified: Secondary | ICD-10-CM | POA: Diagnosis not present

## 2021-06-06 DIAGNOSIS — M6281 Muscle weakness (generalized): Secondary | ICD-10-CM

## 2021-06-06 DIAGNOSIS — R1013 Epigastric pain: Secondary | ICD-10-CM | POA: Diagnosis not present

## 2021-06-06 DIAGNOSIS — Z77018 Contact with and (suspected) exposure to other hazardous metals: Secondary | ICD-10-CM | POA: Diagnosis not present

## 2021-06-06 DIAGNOSIS — R233 Spontaneous ecchymoses: Secondary | ICD-10-CM

## 2021-06-06 DIAGNOSIS — R32 Unspecified urinary incontinence: Secondary | ICD-10-CM

## 2021-06-06 DIAGNOSIS — Z862 Personal history of diseases of the blood and blood-forming organs and certain disorders involving the immune mechanism: Secondary | ICD-10-CM | POA: Diagnosis not present

## 2021-06-06 DIAGNOSIS — R1084 Generalized abdominal pain: Secondary | ICD-10-CM

## 2021-06-06 DIAGNOSIS — E559 Vitamin D deficiency, unspecified: Secondary | ICD-10-CM

## 2021-06-06 DIAGNOSIS — I351 Nonrheumatic aortic (valve) insufficiency: Secondary | ICD-10-CM

## 2021-06-06 LAB — CBC WITH DIFFERENTIAL/PLATELET
Basophils Absolute: 0 10*3/uL (ref 0.0–0.1)
Basophils Relative: 0.4 % (ref 0.0–3.0)
Eosinophils Absolute: 0 10*3/uL (ref 0.0–0.7)
Eosinophils Relative: 1 % (ref 0.0–5.0)
HCT: 41.7 % (ref 36.0–46.0)
Hemoglobin: 13.8 g/dL (ref 12.0–15.0)
Lymphocytes Relative: 45.3 % (ref 12.0–46.0)
Lymphs Abs: 1.8 10*3/uL (ref 0.7–4.0)
MCHC: 33.1 g/dL (ref 30.0–36.0)
MCV: 90.4 fl (ref 78.0–100.0)
Monocytes Absolute: 0.2 10*3/uL (ref 0.1–1.0)
Monocytes Relative: 5.2 % (ref 3.0–12.0)
Neutro Abs: 1.9 10*3/uL (ref 1.4–7.7)
Neutrophils Relative %: 48.1 % (ref 43.0–77.0)
Platelets: 275 10*3/uL (ref 150.0–400.0)
RBC: 4.61 Mil/uL (ref 3.87–5.11)
RDW: 13 % (ref 11.5–15.5)
WBC: 4 10*3/uL (ref 4.0–10.5)

## 2021-06-06 LAB — LIPID PANEL
Cholesterol: 251 mg/dL — ABNORMAL HIGH (ref 0–200)
HDL: 66.5 mg/dL (ref >39.00)
LDL Cholesterol: 159 mg/dL — ABNORMAL HIGH (ref 0–99)
NonHDL: 184.22
Total CHOL/HDL Ratio: 4
Triglycerides: 128 mg/dL (ref 0.0–149.0)
VLDL: 25.6 mg/dL (ref 0.0–40.0)

## 2021-06-06 LAB — IBC + FERRITIN
Ferritin: 60.8 ng/mL (ref 10.0–291.0)
Iron: 108 ug/dL (ref 42–145)
Saturation Ratios: 26 % (ref 20.0–50.0)
TIBC: 415.8 ug/dL (ref 250.0–450.0)
Transferrin: 297 mg/dL (ref 212.0–360.0)

## 2021-06-06 LAB — URINALYSIS, ROUTINE W REFLEX MICROSCOPIC
Bilirubin Urine: NEGATIVE
Ketones, ur: NEGATIVE
Leukocytes,Ua: NEGATIVE
Nitrite: NEGATIVE
Specific Gravity, Urine: 1.025 (ref 1.000–1.030)
Total Protein, Urine: NEGATIVE
Urine Glucose: NEGATIVE
Urobilinogen, UA: 0.2 (ref 0.0–1.0)
WBC, UA: NONE SEEN (ref 0–?)
pH: 6 (ref 5.0–8.0)

## 2021-06-06 LAB — TSH: TSH: 1.88 u[IU]/mL (ref 0.35–5.50)

## 2021-06-06 LAB — VITAMIN D 25 HYDROXY (VIT D DEFICIENCY, FRACTURES): VITD: 16.77 ng/mL — ABNORMAL LOW (ref 30.00–100.00)

## 2021-06-06 NOTE — Addendum Note (Signed)
Addended by: Leeanne Rio on: 06/06/2021 11:04 AM   Modules accepted: Orders

## 2021-06-06 NOTE — Progress Notes (Signed)
Patient ID: [name], female   DOB: Mar 02, 1971, 50 y.o.   MRN: [id number]   Subjective:    Patient ID: [name], female    DOB: [birth year], 50 y.o.   MRN: [id number]  This visit occurred during the SARS-CoV-2 public health emergency.  Safety protocols were in place, including screening questions prior to the visit, additional usage of staff PPE, and extensive cleaning of exam room while observing appropriate contact time as indicated for disinfecting solutions.   Patient here for work in appt .   HPI Work in appt. Pt of Dr Olivia Mackie. In reviewing, she previously saw cardiology for w/up for chest discomfort, dyspnea and apparently leg weakness - limiting activity.  Carotid dopplers - mild carotid artery disease.  ECHO - stable - mild to moderate aortic insufficiency with normal systolic function.  Previous calcium score 0 and very mild plaque in the LAD by CT.  Elected medical treatment.  Also has seen pulmonary - mild OSA. Had covid in January.  Saw neurology 05/03/21 - for evaluation of lower extremity weakness - worsens with steps.  Appears to be worse walking up stairs - more steps - heavier feel.  Had covid vaccine last October - noticed paresthesia in feet several days after and intermittent for  seveal weeks.  Also noticed some tingling in hands.  Has had documented headache for the last two years.  Also diagnosed with chronic fatigue 10/2019.  Reports noticing Bulls eye rash - several years ago.  No recent rash.  No change in headache.  Neurology evaluation - no concerning findings on exam.  Neurology w/up included MRI -  ok.  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  Was treated with doxycycline.  Planning for EMG.  PT recommended as well.  On questioning today, she did take her doxycycline.  No chest pain or increased sob reported.  Eating.  No vomiting.  No abdominal pain or bowel change reported.  Some minimal dysphagia reported - (not new).  Also has noticed some issues with persistent  incontinence.  Had questions about RMSF and treatment.  Also questions given her persistent symptoms - which to not appear to be new.     Past Medical History:  Diagnosis Date   Allergy    Anemia    past hx - corrected after hysterectomy    Anxiety    past hx    Aortic insufficiency    ASCUS favoring benign 12/2015   negative high risk HPV   Chicken pox    COVID-19 december2020, 09-13-2020   GERD (gastroesophageal reflux disease)    during pregnancy    Headache    frequent after MCA 2018    Hiatal hernia    Hyperlipidemia    Hypertension    during pregnancy    LGSIL (low grade squamous intraepithelial dysplasia)    [id number]   Lichen planopilaris    scalp   OSA (obstructive sleep apnea)    Parapelvic renal cyst    Reflux    Sleep apnea    Past Surgical History:  Procedure Laterality Date   COLPOSCOPY     LAPAROSCOPIC VAGINAL HYSTERECTOMY WITH SALPINGO OOPHORECTOMY Bilateral 08/10/2018   Procedure: LAPAROSCOPIC ASSISTED VAGINAL HYSTERECTOMY WITH BILATERAL SALPINGO OOPHORECTOMY;  Surgeon: [name], MD;  Location: [facility];  Service: Gynecology;  Laterality: Bilateral;   TONSILLECTOMY AND ADENOIDECTOMY     Family History  Problem Relation Age of Onset   Hypertension Mother    Cancer Mother  LUNG CANCER/SMOKER   Depression Mother    Mental illness Mother    Lung cancer Mother    Liver cancer Mother    Diabetes Father    Hypertension Father    Heart disease Father    Hyperlipidemia Father    Stroke Father    Hypertension Brother    Diabetes Brother    Depression Brother    Hyperlipidemia Brother    Cancer Maternal Uncle        ESOPHAGEAL CA   Esophageal cancer Maternal Uncle    Diabetes Cousin    Heart disease Cousin    Cancer Maternal Grandfather        Brain   Brain cancer Maternal Grandfather    Stroke Paternal Grandmother    Depression Maternal Grandmother    Mental retardation Maternal Grandmother    Stroke Paternal  Grandfather    Colon cancer Neg Hx    Colon polyps Neg Hx    Rectal cancer Neg Hx    Stomach cancer Neg Hx    Social History   Socioeconomic History   Marital status: Divorced    Spouse name: Not on file   Number of children: 2   Years of education: Not on file   Highest education level: Not on file  Occupational History   Not on file  Tobacco Use   Smoking status: Former    Types: Cigarettes    Quit date: 09/02/1990    Years since quitting: 30.8   Smokeless tobacco: Never   Tobacco comments:    smoked socially in high school   Vaping Use   Vaping Use: Never used  Substance and Sexual Activity   Alcohol use: Not Currently    Alcohol/week: 0.0 standard drinks    Comment: occasionally    Drug use: No   Sexual activity: Not Currently    Birth control/protection: Surgical    Comment: 1st intercourse 50 yo-More than 5 partners-Vasectomy  Other Topics Concern   Not on file  Social History Narrative   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 Negative for appetite change and unexpected weight change.  HENT:  Negative for congestion and sinus pressure.   Respiratory:  Negative for cough, chest tightness and shortness of breath.   Cardiovascular:  Negative for chest pain, palpitations and leg swelling.  Gastrointestinal:  Negative for abdominal pain, diarrhea, nausea and vomiting.  Genitourinary:  Negative for difficulty urinating and dysuria.       Some incontinence issues.    Musculoskeletal:  Negative for joint swelling and myalgias.  Skin:  Negative for color change and rash.  Neurological:   Negative for dizziness and light-headedness.       History of headache.  Does not appear to be new.    Psychiatric/Behavioral:  Negative for agitation and dysphoric mood.       Objective:     BP 122/72   Pulse 63   Temp 97.6 F (36.4 C)   Resp 16   Ht 5' 2.5" (  1.588 m)   Wt 166 lb 12.8 oz (75.7 kg)   LMP 08/02/2018 (Exact Date)   SpO2 98%   BMI 30.02 kg/m  Wt Readings from Last 3 Encounters:  06/06/21 166 lb 12.8 oz (75.7 kg)  04/01/21 164 lb (74.4 kg)  01/23/21 164 lb (74.4 kg)    Physical Exam Vitals reviewed.  Constitutional:      General: She is not in acute distress.    Appearance: Normal appearance.  HENT:     Head: Normocephalic and atraumatic.     Right Ear: External ear normal.     Left Ear: External ear normal.  Eyes:     General: No scleral icterus.       Right eye: No discharge.        Left eye: No discharge.     Conjunctiva/sclera: Conjunctivae normal.  Neck:     Thyroid: No thyromegaly.  Cardiovascular:     Rate and Rhythm: Normal rate and regular rhythm.  Pulmonary:     Effort: No respiratory distress.     Breath sounds: Normal breath sounds. No wheezing.  Abdominal:     General: Bowel sounds are normal.     Palpations: Abdomen is soft.     Tenderness: There is no abdominal tenderness.  Musculoskeletal:        General: No swelling or tenderness.     Cervical back: Neck supple. No tenderness.  Lymphadenopathy:     Cervical: No cervical adenopathy.  Skin:    Findings: No erythema or rash.  Neurological:     Mental Status: She is alert.  Psychiatric:        Mood and Affect: Mood normal.        Behavior: Behavior normal.     Outpatient Encounter Medications as of 06/06/2021  Medication Sig   carvedilol (COREG) 3.125 MG tablet TAKE 1 TABLET (3.125 MG TOTAL) BY MOUTH 2 (TWO) TIMES DAILY WITH A MEAL.   clobetasol (TEMOVATE) 0.05 % external solution SMARTSIG:1 Milliliter(s) Topical Daily   cyclobenzaprine (FLEXERIL) 5 MG tablet TAKE 1 TABLET  (5 MG TOTAL) BY MOUTH AT BEDTIME AS NEEDED FOR MUSCLE SPASMS.   doxycycline (VIBRA-TABS) 100 MG tablet Take 1 tablet (100 mg total) by mouth 2 (two) times daily.   famotidine (PEPCID) 20 MG tablet TAKE 1 TABLET BY MOUTH TWICE DAILY AS NEEDED FOR REFLUX.   hydroxychloroquine (PLAQUENIL) 200 MG tablet Take 200 mg by mouth 2 (two) times daily.   triamterene-hydrochlorothiazide (MAXZIDE-25) 37.5-25 MG tablet TAKE 1/2 TABLET BY MOUTH EVERY MORNING.(STOP HCTZ 12.5 ALONE DOSE) (Patient taking differently: Take 0.5-1 tablets by mouth daily.)   No facility-administered encounter medications on file as of 06/06/2021.     Lab Results  Component Value Date   WBC 4.0 06/06/2021   HGB 13.8 06/06/2021   HCT 41.7 06/06/2021   PLT 275.0 06/06/2021   GLUCOSE 90 01/23/2021   CHOL 251 (H) 06/06/2021   TRIG 128.0 06/06/2021   HDL 66.50 06/06/2021   LDLCALC 159 (H) 06/06/2021   ALT 18 03/30/2020   AST 20 03/30/2020   NA 137 01/23/2021   K 3.8 01/23/2021   CL 105 01/23/2021   CREATININE 0.62 01/23/2021   BUN 9 01/23/2021   CO2 25 01/23/2021   TSH 1.88 06/06/2021   HGBA1C 5.6 03/30/2020       Assessment & Plan:   Problem List Items Addressed This Visit     Aortic valve regurgitation    Had echo as outlined.  Recommended f/u  echo in one year.        Essential hypertension    Blood pressure today as outlined.  Continues on triam/hctz and carvedilol.  Follow pressures.  Check metabolic panel.       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  Treated with doxycycline.  No focal findings on exam.  No new rash.  Headache (minimal - intermittent) appears to have been an issue for years.  Leg weakness as outlined.  Seeing neurology.  Planning for EMG testing.        Hyperlipidemia    Will recheck today (per pt request).  Had previous intolerance to crestor.  Follow.       Iron deficiency anemia    Will recheck cbc and iron studies.        Muscle weakness  (generalized)    Reported lower extremity weakness as outlined.  No focal weakness on exam.  Saw neurology.  CRP wnl.  ESR 17.  MRI - ok.  Planning for EMG.  Pt desires to have labs previously ordered drawn today - including heavy metals screening.   Needs to consider PT.        OSA (obstructive sleep apnea)    Mild sleep apnea on previous HST.  Continue cpap.        Petechiae    Has noticed what she calls petechiae on her feet for years (at least two years).  Localized to lower extremity.  No other rash.  No new rash.  Check cbc.       Urinary incontinence    Persistent issue.  Request urology referral.        Relevant Orders   Ambulatory referral to Urology   Vitamin D deficiency    Recheck vitamin D level today.        Other Visit Diagnoses     History of iron deficiency anemia    -  Primary   Relevant Orders   IBC + Ferritin (Completed)   CBC with Differential/Platelet (Completed)   Exposure to heavy metals       Thyroid disorder screening       Hypertension, unspecified type       Epigastric pain       Abdominal pain, generalized       Screening for blood or protein in urine            Einar Pheasant, MD

## 2021-06-06 NOTE — Addendum Note (Signed)
Addended by: Leeanne Rio on: 06/06/2021 11:05 AM   Modules accepted: Orders

## 2021-06-11 LAB — HEAVY METALS, BLOOD
Arsenic: <1 ug/L (ref 0–9)
Lead, Blood: <1 ug/dL (ref 0–4)
Mercury: <1 ug/L (ref 0.0–14.9)

## 2021-06-14 ENCOUNTER — Telehealth: Payer: Self-pay

## 2021-06-14 ENCOUNTER — Other Ambulatory Visit: Payer: Self-pay

## 2021-06-14 ENCOUNTER — Other Ambulatory Visit (HOSPITAL_COMMUNITY): Payer: Self-pay

## 2021-06-14 MED ORDER — VITAMIN D (ERGOCALCIFEROL) 1.25 MG (50000 UNIT) PO CAPS
50000.0000 [IU] | ORAL_CAPSULE | ORAL | 0 refills | Status: DC
  Filled 2021-06-14: qty 8, 56d supply, fill #0

## 2021-06-14 NOTE — Telephone Encounter (Signed)
LMTCB in regards to lab results.  

## 2021-06-16 ENCOUNTER — Encounter: Payer: Self-pay | Admitting: Internal Medicine

## 2021-06-16 DIAGNOSIS — Z8619 Personal history of other infectious and parasitic diseases: Secondary | ICD-10-CM | POA: Insufficient documentation

## 2021-06-16 NOTE — Assessment & Plan Note (Signed)
Has noticed what she calls petechiae on her feet for years (at least two years).  Localized to lower extremity.  No other rash.  No new rash.  Check cbc.

## 2021-06-16 NOTE — Assessment & Plan Note (Signed)
Blood pressure today as outlined.  Continues on triam/hctz and carvedilol.  Follow pressures.  Check metabolic panel.

## 2021-06-16 NOTE — Assessment & Plan Note (Addendum)
Reported lower extremity weakness as outlined.  No focal weakness on exam.  Saw neurology.  CRP wnl.  ESR 17.  MRI - ok.  Planning for EMG.  Pt desires to have labs previously ordered drawn today - including heavy metals screening.   Needs to consider PT.

## 2021-06-16 NOTE — Assessment & Plan Note (Signed)
Had echo as outlined.  Recommended f/u echo in one year.

## 2021-06-16 NOTE — Assessment & Plan Note (Signed)
Recheck vitamin  D level today.  

## 2021-06-16 NOTE — Assessment & Plan Note (Signed)
Will recheck today (per pt request).  Had previous intolerance to crestor.  Follow.

## 2021-06-16 NOTE — Assessment & Plan Note (Signed)
Recent labs RMSF IgM elevated 1.09.  Treated with doxycycline.  No focal findings on exam.  No new rash.  Headache (minimal - intermittent) appears to have been an issue for years.  Leg weakness as outlined.  Seeing neurology.  Planning for EMG testing.

## 2021-06-16 NOTE — Assessment & Plan Note (Signed)
Mild sleep apnea on previous HST.  Continue cpap.

## 2021-06-16 NOTE — Assessment & Plan Note (Signed)
Will recheck cbc and iron studies.

## 2021-06-16 NOTE — Assessment & Plan Note (Signed)
Persistent issue.  Request urology referral.

## 2021-06-20 ENCOUNTER — Encounter: Payer: Self-pay | Admitting: *Deleted

## 2021-06-22 ENCOUNTER — Other Ambulatory Visit: Payer: Self-pay

## 2021-06-22 ENCOUNTER — Other Ambulatory Visit (HOSPITAL_COMMUNITY): Payer: Self-pay

## 2021-06-22 ENCOUNTER — Ambulatory Visit (INDEPENDENT_AMBULATORY_CARE_PROVIDER_SITE_OTHER): Payer: 59 | Admitting: Internal Medicine

## 2021-06-22 VITALS — BP 123/86 | HR 64 | Ht 62.5 in | Wt 166.6 lb

## 2021-06-22 DIAGNOSIS — E785 Hyperlipidemia, unspecified: Secondary | ICD-10-CM | POA: Diagnosis not present

## 2021-06-22 DIAGNOSIS — I10 Essential (primary) hypertension: Secondary | ICD-10-CM

## 2021-06-22 DIAGNOSIS — R079 Chest pain, unspecified: Secondary | ICD-10-CM

## 2021-06-22 DIAGNOSIS — R002 Palpitations: Secondary | ICD-10-CM

## 2021-06-22 DIAGNOSIS — I351 Nonrheumatic aortic (valve) insufficiency: Secondary | ICD-10-CM

## 2021-06-22 DIAGNOSIS — R17 Unspecified jaundice: Secondary | ICD-10-CM

## 2021-06-22 MED ORDER — CARVEDILOL 3.125 MG PO TABS
3.1250 mg | ORAL_TABLET | Freq: Two times a day (BID) | ORAL | 3 refills | Status: DC
  Filled 2021-06-22: qty 180, 90d supply, fill #0
  Filled 2021-10-12: qty 180, 90d supply, fill #1
  Filled 2022-01-02: qty 180, 90d supply, fill #2
  Filled 2022-06-11: qty 180, 90d supply, fill #3
  Filled 2022-06-14: qty 180, 90d supply, fill #0

## 2021-06-22 MED ORDER — PITAVASTATIN CALCIUM 2 MG PO TABS
1.0000 | ORAL_TABLET | Freq: Every day | ORAL | 3 refills | Status: DC
  Filled 2021-06-22: qty 90, 90d supply, fill #0
  Filled 2021-10-12: qty 90, 90d supply, fill #1
  Filled 2022-01-02: qty 90, 90d supply, fill #2

## 2021-06-22 MED FILL — Famotidine Tab 20 MG: ORAL | 30 days supply | Qty: 60 | Fill #3 | Status: AC

## 2021-06-22 NOTE — Progress Notes (Signed)
Office VIsit  Date:  06/22/2021   ID:  [name], DOB [birth year], MRN [id number]  Patient Location:  [name] [id number]  Provider location:   [address], [address], [place] [address]  PCP:  McLean-Scocuzza, Nino Glow, MD  Cardiologist:  Pixie Casino, MD Electrophysiologist:  None   Chief Complaint:  Follow-up chest pain  History of Present Illness:    [name] is a 50 y.o. female who presents via audio/video conferencing for a telehealth visit today.  [name] is seen today in follow-up. I had last seen her in 2019 for leg swelling - recently she had chest pain - more upper back and shoulder blade, constant, worse with deep breathing. She was seen in the ER and had a negative CT for PE. Subsequently, she saw Almyra Deforest, PA-C in the office. He ordered a CT coronary angiogram which was performed on 11/17/2018 - this demonstrated 0 (zero) coronary calcium, however, there was a small amount of non-calcified plaque in the LAD (10-20%) - aggressive risk factor modification was recommended. She had recent labs, including a lipid profile which showed an LDL of 223, HDL of 68 and trigs of 140. I reviewed these results with her today and my recommendations for aggressive dietary intervention and possible lipid lowering therapy to target LDL of <70.  05/12/2020  [name] returns today for follow-up.  This is a virtual visit.  She actually came to the office thinking she had an office appointment.  We did perform a telephone visit.  This is follow-up because she was noted to have isolated elevation in her bilirubin which was 1.5.  Unfortunately this was not fractionated.  Her PCP advised her to discontinue her statin.  Her liver enzymes were normal.  Subsequently a repeat metabolic profile with a direct bilirubin were performed showing a normal direct bilirubin of 0.1 and again normal liver enzymes 3 months later.  Prior to that she had excellent  improvement in her lipids close to her target.  She otherwise did seem to tolerate her rosuvastatin and again had no elevation in liver enzymes.  She also had had Covid this past year.  During this time when her bilirubin was elevated she reported some abdominal bloating, changes in her urine and some intermittent right upper quadrant tenderness suggestive of possible biliary cholestasis.  06/22/2021  [name] is seen today in follow-up.  Recently labs show an increase in her cholesterol which is significant almost double of what it had been previously.  Total cholesterol 251, HDL 66, triglycerides 128 and LDL 159.  She has discontinued her rosuvastatin which she said caused her abdominal bloating and elevated liver enzymes.  Recent ALT in July however was normal at 18.  She also had increase in bilirubin however continue work-up of that seems to not be very revealing.  We also discussed the fact that she would benefit from lipid-lowering as she had a CT coronary angiogram which showed a small amount of noncalcified plaque in the LAD.  I would favor considering an alternative statin.  The patient does not have symptoms concerning for COVID-19 infection (fever, chills, cough, or new SHORTNESS OF BREATH).    Prior CV studies:   The following studies were reviewed today:  CT coronary angiogram Recent lipid profile  PMHx:  Past Medical History:  Diagnosis Date   Allergy    Anemia    past hx - corrected after hysterectomy    Anxiety  past hx    Aortic insufficiency    ASCUS favoring benign 12/2015   negative high risk HPV   Chicken pox    COVID-19 december2020, 09-13-2020   GERD (gastroesophageal reflux disease)    during pregnancy    Headache    frequent after MCA 2018    Hiatal hernia    Hyperlipidemia    Hypertension    during pregnancy    LGSIL (low grade squamous intraepithelial dysplasia)    [id number]   Lichen planopilaris    scalp   OSA (obstructive sleep apnea)     Parapelvic renal cyst    Reflux    Sleep apnea     Past Surgical History:  Procedure Laterality Date   COLPOSCOPY     LAPAROSCOPIC VAGINAL HYSTERECTOMY WITH SALPINGO OOPHORECTOMY Bilateral 08/10/2018   Procedure: LAPAROSCOPIC ASSISTED VAGINAL HYSTERECTOMY WITH BILATERAL SALPINGO OOPHORECTOMY;  Surgeon: [name], MD;  Location: [facility];  Service: Gynecology;  Laterality: Bilateral;   TONSILLECTOMY AND ADENOIDECTOMY      FAMHx:  Family History  Problem Relation Age of Onset   Hypertension Mother    Cancer Mother        LUNG CANCER/SMOKER   Depression Mother    Mental illness Mother    Lung cancer Mother    Liver cancer Mother    Diabetes Father    Hypertension Father    Heart disease Father    Hyperlipidemia Father    Stroke Father    Hypertension Brother    Diabetes Brother    Depression Brother    Hyperlipidemia Brother    Cancer Maternal Uncle        ESOPHAGEAL CA   Esophageal cancer Maternal Uncle    Diabetes Cousin    Heart disease Cousin    Cancer Maternal Grandfather        Brain   Brain cancer Maternal Grandfather    Stroke Paternal Grandmother    Depression Maternal Grandmother    Mental retardation Maternal Grandmother    Stroke Paternal Grandfather    Colon cancer Neg Hx    Colon polyps Neg Hx    Rectal cancer Neg Hx    Stomach cancer Neg Hx     SOCHx:   reports that she quit smoking about 30 years ago. Her smoking use included cigarettes. She has never used smokeless tobacco. She reports that she does not currently use alcohol. She reports that she does not use drugs.  ALLERGIES:  Allergies  Allergen Reactions   Codeine Shortness Of Breath    Breathing problems  - liquid narcotic pain med with codeine    Crestor [Rosuvastatin]     Abdominal bloating elevated lfts     Erythromycin     GI upset    Levofloxacin     Heart racing     MEDS:  Current Meds  Medication Sig   clobetasol (TEMOVATE) 0.05 % external  solution SMARTSIG:1 Milliliter(s) Topical Daily   Cyanocobalamin (B-12) 2000 MCG TABS Take 4,000 mcg by mouth daily.   famotidine (PEPCID) 20 MG tablet TAKE 1 TABLET BY MOUTH TWICE DAILY AS NEEDED FOR REFLUX.   Pitavastatin Calcium 2 MG TABS Take 1 tablet (2 mg total) by mouth daily.   Vitamin D, Ergocalciferol, (DRISDOL) 1.25 MG (50000 UNIT) CAPS capsule Take 1 capsule (50,000 Units total) by mouth every 7 (seven) days.   [DISCONTINUED] carvedilol (COREG) 3.125 MG tablet TAKE 1 TABLET (3.125 MG TOTAL) BY MOUTH 2 (TWO) TIMES DAILY WITH A  MEAL.     ROS: Pertinent items noted in HPI and remainder of comprehensive ROS otherwise negative.  Labs/Other Tests and Data Reviewed:    Recent Labs: 01/23/2021: BUN 9; Creatinine, Ser 0.62; Potassium 3.8; Sodium 137 06/06/2021: Hemoglobin 13.8; Platelets 275.0; TSH 1.88   Recent Lipid Panel Lab Results  Component Value Date/Time   CHOL 251 (H) 06/06/2021 11:05 AM   CHOL 221 (H) 07/06/2019 10:04 AM   TRIG 128.0 06/06/2021 11:05 AM   HDL 66.50 06/06/2021 11:05 AM   HDL 61 07/06/2019 10:04 AM   CHOLHDL 4 06/06/2021 11:05 AM   LDLCALC 159 (H) 06/06/2021 11:05 AM   LDLCALC 144 (H) 07/06/2019 10:04 AM    Wt Readings from Last 3 Encounters:  06/22/21 166 lb 9.6 oz (75.6 kg)  06/06/21 166 lb 12.8 oz (75.7 kg)  04/01/21 164 lb (74.4 kg)     Exam:    Vital Signs:  BP 123/86   Pulse 64   Ht 5' 2.5" (1.588 m)   Wt 166 lb 9.6 oz (75.6 kg)   LMP 08/02/2018 (Exact Date)   SpO2 99%   BMI 29.99 kg/m    General appearance: alert and no distress Lungs: no visible difficulty breathing Extremities: extremities normal, atraumatic, no cyanosis or edema Skin: Skin color, texture, turgor normal. No rashes or lesions Neurologic: Mental status: Alert, oriented, thought content appropriate Psych: Pleasant, mildly anxious  ASSESSMENT & PLAN:    Elevated bilirubin-?  Biliary cholestasis Pleuritic chest and upper back pain Low risk cardiac CT with CAC of  0, mild non-calcified plaque of the LAD Dyslipidemia Hypertension  Ms. Drew reported side effects on rosuvastatin and is stopped the medication but her cholesterol is accordingly going up about 50%.  Based on the fact that she had some noncalcified plaque on her CT, would recommend further lipid-lowering.  We discussed other options and I would advise we consider Livalo 2mg  daily.  We will go ahead and check liver enzymes and total and fractionated bilirubin in about 2 weeks and if it continues to be unchanged and she is tolerating it repeat lipids in about 3 to 4 months.  COVID-19 Education: The signs and symptoms of COVID-19 were discussed with the patient and how to seek care for testing (follow up with PCP or arrange E-visit).  The importance of social distancing was discussed today.  Patient Risk:   After full review of this patients clinical status, I feel that they are at least moderate risk at this time.  Time:   Today, I have spent 25 minutes with the patient with telehealth technology discussing her CT results, medications, cholesterol profile, healthy diet and exercise changes, aspirin use, COVID-19 preventative measures     Medication Adjustments/Labs and Tests Ordered: Current medicines are reviewed at length with the patient today.  Concerns regarding medicines are outlined above.   Tests Ordered: Orders Placed This Encounter  Procedures   Hepatic function panel   Bilirubin, fractionated (tot/dir/indir)   Lipid panel   ECHOCARDIOGRAM COMPLETE    Medication Changes: Meds ordered this encounter  Medications   carvedilol (COREG) 3.125 MG tablet    Sig: Take 1 tablet (3.125 mg total) by mouth 2 (two) times daily with a meal.    Dispense:  180 tablet    Refill:  3   Pitavastatin Calcium 2 MG TABS    Sig: Take 1 tablet (2 mg total) by mouth daily.    Dispense:  90 tablet    Refill:  3  Disposition:  in 3 month(s) in lipid clinic  Pixie Casino, MD, [facility], [place] Director of the Advanced Lipid Disorders &  Cardiovascular Risk Reduction Clinic Diplomate of the American Board of Clinical Lipidology Attending Cardiologist  Direct Dial: [phone]  Fax: [phone]  Website:  [email or web address]  Pixie Casino, MD  06/22/2021 1:12 PM

## 2021-06-22 NOTE — Patient Instructions (Addendum)
Medication Instructions:  START pitavastatin (livalo) 2mg  daily  STOP triamterene-hctz  *If you need a refill on your cardiac medications before your next appointment, please call your pharmacy*   Lab Work: NON-FASTING liver enzyme, bilirubin testing after 2 weeks on the medication  FASTING lipid panel after 3 months on the medication   If you have labs (blood work) drawn today and your tests are completely normal, you will receive your results only by: [place] (if you have MyChart) OR A paper copy in the mail If you have any lab test that is abnormal or we need to change your treatment, we will call you to review the results.  Testing: Echocardiogram in June 2023  Follow up: After echo in June/July 2023

## 2021-07-10 DIAGNOSIS — G5601 Carpal tunnel syndrome, right upper limb: Secondary | ICD-10-CM | POA: Diagnosis not present

## 2021-07-10 DIAGNOSIS — R233 Spontaneous ecchymoses: Secondary | ICD-10-CM | POA: Diagnosis not present

## 2021-07-10 DIAGNOSIS — R29898 Other symptoms and signs involving the musculoskeletal system: Secondary | ICD-10-CM | POA: Diagnosis not present

## 2021-07-17 ENCOUNTER — Ambulatory Visit: Payer: 59 | Admitting: Internal Medicine

## 2021-07-17 ENCOUNTER — Telehealth: Payer: Self-pay | Admitting: Internal Medicine

## 2021-07-17 NOTE — Telephone Encounter (Signed)
Patient no-showed today's appointment; appointment was for 07/17/21, provider notified for review of record. Letter sent for patient to call in and re-schedule.

## 2021-07-31 ENCOUNTER — Encounter: Payer: Self-pay | Admitting: Nurse Practitioner

## 2021-08-03 ENCOUNTER — Telehealth: Payer: Self-pay | Admitting: Internal Medicine

## 2021-08-03 NOTE — Telephone Encounter (Signed)
Patient called to follow up on paperwork for Accommodation for Covid vaccine.

## 2021-08-03 NOTE — Telephone Encounter (Signed)
Form is ready scan old and new forms and call pt ready to pick up thank you

## 2021-08-06 NOTE — Telephone Encounter (Signed)
Patient is needing an updated written statement as to the reason she can't take the Covid vaccine. They will not take a copy of the form from last year. It has to be updated current documentation. The patient has already followed up with the neurologist.

## 2021-08-06 NOTE — Telephone Encounter (Signed)
Copy of paperwork made and sent to scan.   Paperwork scanned to [email or web address] and [email or web address] .   Called patient and informed.

## 2021-08-06 NOTE — Telephone Encounter (Signed)
Pt called to follow up on paperwork. Pt stated that she needs paperwork for today for work. Pt stated that she may lose her job. Pt stated that she didn't receive the information. Pt requesting callback.

## 2021-08-07 NOTE — Telephone Encounter (Signed)
Left message to return call.  Will need Patient to e-mail or drop of her copy of documents so that Dr Olivia Mackie McLean-Scocuzza may fill out and sign the back page. Spoke with Dr Olivia Mackie McLean-Scocuzza and she states the back page said internal review team so she misunderstood that it needed her signature again.   Copy made in the office has been sent to scan but not yet in the system for me to print out and have signed.

## 2021-08-07 NOTE — Telephone Encounter (Signed)
Patient calling back in and explain that form will be needing new information stating that previous symptoms after having COVID vaccine was not caused by a neurological issue. Patient saw Neurology and they state the Patient did not have neuropathy.   Informed the Patient that paperwork this extensive is not done outside of visit if all information is needing to be changed. Patient needing a letter stating that she had a reaction top the COVID vaccine and can not have another vaccine. Also needing last page of the forms re-filled out.   Patient scheduled to come in 08/08/21 at 3:30.

## 2021-08-07 NOTE — Telephone Encounter (Signed)
Will write letter.

## 2021-08-08 ENCOUNTER — Encounter: Payer: Self-pay | Admitting: Internal Medicine

## 2021-08-08 ENCOUNTER — Other Ambulatory Visit: Payer: Self-pay

## 2021-08-08 ENCOUNTER — Other Ambulatory Visit (HOSPITAL_COMMUNITY): Payer: Self-pay

## 2021-08-08 ENCOUNTER — Ambulatory Visit: Payer: 59 | Admitting: Internal Medicine

## 2021-08-08 ENCOUNTER — Telehealth: Payer: Self-pay | Admitting: Internal Medicine

## 2021-08-08 VITALS — BP 124/82 | HR 76 | Temp 97.2°F | Ht 62.5 in | Wt 170.2 lb

## 2021-08-08 DIAGNOSIS — K449 Diaphragmatic hernia without obstruction or gangrene: Secondary | ICD-10-CM

## 2021-08-08 DIAGNOSIS — N393 Stress incontinence (female) (male): Secondary | ICD-10-CM

## 2021-08-08 DIAGNOSIS — R17 Unspecified jaundice: Secondary | ICD-10-CM | POA: Diagnosis not present

## 2021-08-08 DIAGNOSIS — Z0001 Encounter for general adult medical examination with abnormal findings: Secondary | ICD-10-CM

## 2021-08-08 DIAGNOSIS — E785 Hyperlipidemia, unspecified: Secondary | ICD-10-CM

## 2021-08-08 DIAGNOSIS — N3281 Overactive bladder: Secondary | ICD-10-CM | POA: Diagnosis not present

## 2021-08-08 DIAGNOSIS — R32 Unspecified urinary incontinence: Secondary | ICD-10-CM

## 2021-08-08 DIAGNOSIS — Z0000 Encounter for general adult medical examination without abnormal findings: Secondary | ICD-10-CM | POA: Diagnosis not present

## 2021-08-08 DIAGNOSIS — Z1283 Encounter for screening for malignant neoplasm of skin: Secondary | ICD-10-CM

## 2021-08-08 DIAGNOSIS — G5603 Carpal tunnel syndrome, bilateral upper limbs: Secondary | ICD-10-CM | POA: Diagnosis not present

## 2021-08-08 DIAGNOSIS — R233 Spontaneous ecchymoses: Secondary | ICD-10-CM | POA: Diagnosis not present

## 2021-08-08 DIAGNOSIS — Z1211 Encounter for screening for malignant neoplasm of colon: Secondary | ICD-10-CM

## 2021-08-08 DIAGNOSIS — E559 Vitamin D deficiency, unspecified: Secondary | ICD-10-CM | POA: Diagnosis not present

## 2021-08-08 DIAGNOSIS — Z1231 Encounter for screening mammogram for malignant neoplasm of breast: Secondary | ICD-10-CM | POA: Diagnosis not present

## 2021-08-08 DIAGNOSIS — K219 Gastro-esophageal reflux disease without esophagitis: Secondary | ICD-10-CM

## 2021-08-08 DIAGNOSIS — E538 Deficiency of other specified B group vitamins: Secondary | ICD-10-CM

## 2021-08-08 DIAGNOSIS — A770 Spotted fever due to Rickettsia rickettsii: Secondary | ICD-10-CM | POA: Insufficient documentation

## 2021-08-08 DIAGNOSIS — G5600 Carpal tunnel syndrome, unspecified upper limb: Secondary | ICD-10-CM | POA: Insufficient documentation

## 2021-08-08 MED ORDER — VITAMIN D (ERGOCALCIFEROL) 1.25 MG (50000 UNIT) PO CAPS
50000.0000 [IU] | ORAL_CAPSULE | ORAL | 1 refills | Status: DC
  Filled 2021-08-08: qty 13, 91d supply, fill #0

## 2021-08-08 MED ORDER — VITAMIN D (ERGOCALCIFEROL) 1.25 MG (50000 UNIT) PO CAPS
50000.0000 [IU] | ORAL_CAPSULE | ORAL | 1 refills | Status: DC
  Filled 2021-08-08: qty 12, 84d supply, fill #0

## 2021-08-08 MED ORDER — B-12 2000 MCG PO TABS
1.0000 | ORAL_TABLET | Freq: Every day | ORAL | 2 refills | Status: DC
  Filled 2021-08-08: qty 90, fill #0

## 2021-08-08 NOTE — Telephone Encounter (Signed)
Lft pt vm with the number to call and sch appt at [facility]. thanks

## 2021-08-08 NOTE — Progress Notes (Signed)
Chief Complaint  Patient presents with   Form Completion   Allergic Reaction    To Covid vaccine.    Annual Exam   Annual  1. Referred mammogram, colonoscopy, dermatology  2. Petechie to b/l feet will refer to [facility] dermatology and consider rheumatology in the future  3. Hld livalo 2 mg qhs and will repeat labs early to mid 10/2021 4. 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  HENT:  Negative for hearing loss.   Eyes:  Negative for blurred vision.  Respiratory:  Negative for shortness of breath.   Cardiovascular:  Negative for chest pain.  Gastrointestinal:  Negative for abdominal pain and blood in stool.  Genitourinary:  Negative for dysuria.       +incontinence   Musculoskeletal:  Negative for falls and joint pain.  Skin:  Negative for rash.  Neurological:  Negative for headaches.  Psychiatric/Behavioral:  Negative for depression.   Past Medical History:  Diagnosis Date   Allergy    Anemia    past hx - corrected after hysterectomy    Anxiety    past hx    Aortic insufficiency    ASCUS favoring benign 12/2015   negative high risk HPV   Chicken pox    COVID-19 december2020, 09-13-2020   GERD (gastroesophageal reflux disease)    during pregnancy    Headache    frequent after MCA 2018    Hiatal hernia    Hyperlipidemia    Hypertension    during pregnancy    LGSIL (low grade squamous intraepithelial dysplasia)    [id number]   Lichen planopilaris    scalp   OSA (obstructive sleep apnea)    Parapelvic renal cyst    Reflux    Sleep apnea    Past Surgical History:  Procedure Laterality Date   COLPOSCOPY      LAPAROSCOPIC VAGINAL HYSTERECTOMY WITH SALPINGO OOPHORECTOMY Bilateral 08/10/2018   Procedure: LAPAROSCOPIC ASSISTED VAGINAL HYSTERECTOMY WITH BILATERAL SALPINGO OOPHORECTOMY;  Surgeon: [name], MD;  Location: [facility];  Service: Gynecology;  Laterality: Bilateral;   TONSILLECTOMY AND ADENOIDECTOMY     Family History  Problem Relation Age of Onset   Hypertension Mother    Cancer Mother        LUNG CANCER/SMOKER   Depression Mother    Mental illness Mother    Lung cancer Mother    Liver cancer Mother    Diabetes Father    Hypertension Father    Heart disease Father    Hyperlipidemia Father    Stroke Father    Hypertension Brother    Diabetes Brother    Depression Brother    Hyperlipidemia Brother    Cancer Maternal Uncle        ESOPHAGEAL CA   Esophageal cancer Maternal Uncle    Diabetes Cousin    Heart disease Cousin    Cancer Maternal Grandfather        Brain   Brain cancer Maternal Grandfather    Stroke Paternal Grandmother    Depression Maternal Grandmother    Mental retardation Maternal Grandmother    Stroke  Paternal Grandfather    Colon cancer Neg Hx    Colon polyps Neg Hx    Rectal cancer Neg Hx    Stomach cancer Neg Hx    Social History   Socioeconomic History   Marital status: Divorced    Spouse name: Not on file   Number of children: 2   Years of education: Not on file   Highest education level: Not on file  Occupational History   Not on file  Tobacco Use   Smoking status: Former    Types: Cigarettes    Quit date: 09/02/1990    Years since quitting: 30.9   Smokeless tobacco: Never   Tobacco comments:    smoked socially in high school   Vaping Use   Vaping Use: Never used  Substance and Sexual Activity   Alcohol use: Not Currently    Alcohol/week: 0.0 standard drinks    Comment: occasionally    Drug use: No   Sexual activity: Not Currently    Birth control/protection: Surgical    Comment: 1st intercourse 50 yo-More  than 5 partners-Vasectomy  Other Topics Concern   Not on file  Social History Narrative   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   clobetasol (TEMOVATE) 0.05 % external solution SMARTSIG:1 Milliliter(s) Topical Daily   Cyanocobalamin (B-12) 2000 MCG TABS Take 1 tablet by mouth daily.   hydroxychloroquine (PLAQUENIL) 200 MG tablet Take 200 mg by mouth 2 (two) times daily.   Pitavastatin Calcium 2 MG TABS Take 1 tablet (2 mg total) by mouth daily.   [DISCONTINUED] Cyanocobalamin (B-12) 2000 MCG TABS Take 4,000 mcg by mouth daily.   [DISCONTINUED] Vitamin D, Ergocalciferol, (DRISDOL) 1.25 MG (50000 UNIT) CAPS capsule Take 1 capsule (50,000 Units total) by mouth every 7 (seven) days.   Allergies  Allergen Reactions   Codeine Shortness Of Breath    Breathing problems  - liquid narcotic pain med with codeine    Covid-19 (Mrna) Vaccine Other (See Comments)    Numbness, pins and needles sensation in the hands and feet    Crestor [Rosuvastatin]     Abdominal bloating elevated lfts     Erythromycin     GI upset    Levofloxacin     Heart racing    Recent Results (from the past 2160 hour(s))  Heavy Metals, Blood     Status: None   Collection Time: 06/06/21 12:00 AM  Result Value Ref Range   Lead, Blood <1 0 - 4 ug/dL    Comment: Testing performed by Inductively coupled Estate manager/land agent.                           Environmental Exposure:  WHO Recommendation     <20                           Occupational Exposure:                            OSHA Lead Std          40                            BEI                    30                                 Detection Limit =  1 This test was developed and its performance characteristics determined by [facility]. It has not been cleared or approved by the Food and Drug Administration.          **Please note reference interval change**    Arsenic <1 0 - 9 ug/L    Comment:                                 Detection Limit = 1   Mercury <1.0 0.0 - 14.9 ug/L    Comment:                         Environmental Exposure:  <15.0                         Occupational Exposure:                          BEI - Inorganic Mercury: 15.0                                 Detection Limit =  1.0   IBC + Ferritin     Status: None   Collection Time: 06/06/21 11:05 AM  Result Value Ref Range   Iron 108 42 - 145 ug/dL   Transferrin 297.0 212.0 - 360.0 mg/dL   Saturation Ratios 26.0 20.0 - 50.0 %   Ferritin 60.8 10.0 - 291.0 ng/mL   TIBC 415.8 250.0 - 450.0 mcg/dL  CBC with Differential/Platelet     Status: None   Collection Time: 06/06/21 11:05 AM  Result Value Ref Range   WBC 4.0 4.0 - 10.5 K/uL   RBC 4.61 3.87 - 5.11 Mil/uL   Hemoglobin 13.8 12.0 - 15.0 g/dL   HCT 41.7 36.0 - 46.0 %   MCV 90.4 78.0 - 100.0 fl   MCHC 33.1 30.0 - 36.0 g/dL   RDW 13.0 11.5 - 15.5 %   Platelets 275.0 150.0 - 400.0 K/uL   Neutrophils Relative % 48.1 43.0 - 77.0 %   Lymphocytes Relative 45.3 12.0 - 46.0 %   Monocytes Relative 5.2 3.0 - 12.0 %   Eosinophils Relative 1.0 0.0 - 5.0 %   Basophils Relative 0.4 0.0 - 3.0 %   Neutro Abs 1.9 1.4 - 7.7 K/uL   Lymphs Abs 1.8 0.7 - 4.0 K/uL   Monocytes Absolute 0.2 0.1 -  1.0 K/uL   Eosinophils Absolute 0.0 0.0 - 0.7 K/uL   Basophils Absolute 0.0 0.0 - 0.1 K/uL  Vitamin D (25 hydroxy)     Status: Abnormal   Collection Time: 06/06/21 11:05 AM  Result Value Ref Range   VITD 16.77 (L) 30.00 - 100.00  ng/mL  TSH     Status: None   Collection Time: 06/06/21 11:05 AM  Result Value Ref Range   TSH 1.88 0.35 - 5.50 uIU/mL  Lipid panel     Status: Abnormal   Collection Time: 06/06/21 11:05 AM  Result Value Ref Range   Cholesterol 251 (H) 0 - 200 mg/dL    Comment: ATP III Classification       Desirable:  < 200 mg/dL               Borderline High:  200 - 239 mg/dL          High:  > = 240 mg/dL   Triglycerides 128.0 0.0 - 149.0 mg/dL    Comment: Normal:  <150 mg/dLBorderline High:  150 - 199 mg/dL   HDL 66.50 >39.00 mg/dL   VLDL 25.6 0.0 - 40.0 mg/dL   LDL Cholesterol 159 (H) 0 - 99 mg/dL   Total CHOL/HDL Ratio 4     Comment:                Men          Women1/2 Average Risk     3.4          3.3Average Risk          5.0          4.42X Average Risk          9.6          7.13X Average Risk          15.0          11.0                       NonHDL 184.22     Comment: NOTE:  Non-HDL goal should be 30 mg/dL higher than patient's LDL goal (i.e. LDL goal of < 70 mg/dL, would have non-HDL goal of < 100 mg/dL)  Urinalysis, Routine w reflex microscopic     Status: Abnormal   Collection Time: 06/06/21 11:05 AM  Result Value Ref Range   Color, Urine YELLOW Yellow;Lt. Yellow;Straw;Dark Yellow;Amber;Green;Red;Brown   APPearance CLEAR Clear;Turbid;Slightly Cloudy;Cloudy   Specific Gravity, Urine 1.025 1.000 - 1.030   pH 6.0 5.0 - 8.0   Total Protein, Urine NEGATIVE Negative   Urine Glucose NEGATIVE Negative   Ketones, ur NEGATIVE Negative   Bilirubin Urine NEGATIVE Negative   Hgb urine dipstick TRACE-INTACT (A) Negative   Urobilinogen, UA 0.2 0.0 - 1.0   Leukocytes,Ua NEGATIVE Negative   Nitrite NEGATIVE Negative   WBC, UA none seen 0-2/hpf   RBC / HPF 0-2/hpf 0-2/hpf   Objective  Body mass index is 30.63 kg/m. Wt Readings from Last 3 Encounters:  08/08/21 170 lb 3.2 oz (77.2 kg)  06/22/21 166 lb 9.6 oz (75.6 kg)  06/06/21 166 lb 12.8 oz (75.7 kg)   Temp Readings from Last 3 Encounters:   08/08/21 (!) 97.2 F (36.2 C) (Temporal)  06/06/21 97.6 F (36.4 C)  04/01/21 98.5 F (36.9 C) (Oral)   BP Readings from Last 3 Encounters:  08/08/21 124/82  06/22/21 123/86  06/06/21 122/72   Pulse Readings from Last 3  Encounters:  08/08/21 76  06/22/21 64  06/06/21 63    Physical Exam Vitals and nursing note reviewed.  Constitutional:      Appearance: Normal appearance. She is well-developed and well-groomed.  HENT:     Head: Normocephalic and atraumatic.  Eyes:     Conjunctiva/sclera: Conjunctivae normal.     Pupils: Pupils are equal, round, and reactive to light.  Cardiovascular:     Rate and Rhythm: Normal rate and regular rhythm.     Heart sounds: Normal heart sounds. No murmur heard. Pulmonary:     Effort: Pulmonary effort is normal.     Breath sounds: Normal breath sounds.  Abdominal:     General: Abdomen is flat. Bowel sounds are normal.     Tenderness: There is no abdominal tenderness.  Musculoskeletal:        General: No tenderness.  Skin:    General: Skin is warm and dry.     Comments: Petechiae to b/l feet  Neurological:     General: No focal deficit present.     Mental Status: She is alert and oriented to person, place, and time. Mental status is at baseline.     Cranial Nerves: Cranial nerves 2-12 are intact.     Gait: Gait is intact.  Psychiatric:        Attention and Perception: Attention and perception normal.        Mood and Affect: Mood and affect normal.        Speech: Speech normal.        Behavior: Behavior normal. Behavior is cooperative.        Thought Content: Thought content normal.        Cognition and Memory: Cognition and memory normal.        Judgment: Judgment normal.    Assessment  Plan  Abnormal physical evaluation-billed for CPE today Flu shot had 06/23/19 2021 declines excempt and also covid 19 and rabies paperwork filled out 08/07/21 had burning/tingling/numbness b/l legs after flu, rabies, covid shots  Tdap 04/02/21    1/2 covid vaccine pfizer 05/12/20  and had reaction declines further for now and wants exemption flu and covid vaccine neurology appt sch 01/31/21 H/o rabies vaccine see above   DEXA 03/09/18 normal    mammo 10/28/19 normal  Call to schedule    Pap 02/13/18 negative s/p hysterectomy h/o LSIL  08/2018 fibroids and benign right ovarian cyst  Vaginal cuff bx 03/09/19  Benign granulation tissue which is immature scar tissue.  - h/o abnormal pap LSIL LMP 10/19/17  -->Dr. 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 with neg pap afterwards  -also menorrhagia/breakthrough bleeding/right solid ovarian mass/endocmetrial polp noted 12/06/15 rec hysterectomy at that time and never f/u on this vs [place] and endometrial sampling if polyp then hysteroscopy with D&C with resection of polyp endometrial bx in 2012 benign fragments of endometrial polyp    Colonoscopy referred [facility] referral  She c/o change in bowels (firm and loose stools), ab bloating, constipation, GERD, hiatal hernia     Skin derm established [facility]   Rec healthy diet and exercise   Bilateral carpal tunnel syndrome Rx b/l wrists splints  Per [facility]y [facility]   Urinary incontinence,s/p hysterectomy in 2019 - Plan: Ambulatory referral to Urogynecology Overactive bladder - Plan: Ambulatory referral to Urogynecology Stress incontinence of urine - Plan: Ambulatory referral to Urogynecology  Petechiae  b/l feet- Plan: Ambulatory referral to Dermatology Consider rheumatology  if dermatology cant figure out Skin cancer screening - Plan: Ambulatory referral to Dermatology  Vitamin D deficiency - Plan: Vitamin D, Ergocalciferol, (DRISDOL) 1.25 MG (50000 UNIT) CAPS capsule 1x per week  Hyperlipidemia, unspecified hyperlipidemia type - Plan: Comprehensive metabolic panel, Lipid panel  Elevated bilirubin - Plan: Bilirubin, Direct  B12 deficiency - Plan: Vitamin B12,  Cyanocobalamin (B-12) 2000 MCG TABS Per [facility]   RMSF [facility] spotted fever) - Plan: [place] [facility] spotted fvr abs pnl(IgG+IgM)  Titer 1.09 05/03/21 igM   Provider: Dr. Olivia Mackie McLean-Scocuzza-Internal Medicine

## 2021-08-08 NOTE — Patient Instructions (Addendum)
Call mammogram  Call GI   Dr. Donnamarie Poag   Phone Fax E-mail Address  [phone] Not available [place]   [place] [address]   Dr. Fontaine No dermatology  [phone]    Dr. Sherlene Shams -will call you  [phone] Not available [address]   [place]  [address]      Specialties     Urology-gyn       Overactive Bladder, Adult Overactive bladder is a condition in which a person has a sudden and frequent need to urinate. A person might also leak urine if he or she cannot get to the bathroom fast enough (urinary incontinence). Sometimes, symptoms can interfere with work or social activities. What are the causes? Overactive bladder is associated with poor nerve signals between your bladder and your brain. Your bladder may get the signal to empty before it is full. You may also have very sensitive muscles that make your bladder squeeze too soon. This condition may also be caused by other factors, such as: Medical conditions: Urinary tract infection. Infection of nearby tissues. Prostate enlargement. Bladder stones, inflammation, or tumors. Diabetes. Muscle or nerve weakness, especially from these conditions: A spinal cord injury. Stroke. Multiple sclerosis. Parkinson's disease. Other causes: Surgery on the uterus or urethra. Drinking too much caffeine or alcohol. Certain medicines, especially those that eliminate extra fluid in the body (diuretics). Constipation. What increases the risk? You may be at greater risk for overactive bladder if you: Are an older adult. Smoke. Are going through menopause. Have prostate problems. Have a neurological disease, such as stroke, dementia, Parkinson's disease, or multiple sclerosis (MS). Eat or drink alcohol, spicy food, caffeine, and other things that irritate the bladder. Are overweight or obese. What are the signs or symptoms? Symptoms of this condition include  a sudden, strong urge to urinate. Other symptoms include: Leaking urine. Urinating 8 or more times a day. Waking up to urinate 2 or more times overnight. How is this diagnosed? This condition may be diagnosed based on: Your symptoms and medical history. A physical exam. Blood or urine tests to check for possible causes, such as infection. You may also need to see a health care provider who specializes in urinary tract problems. This is called a urologist. How is this treated? Treatment for overactive bladder depends on the cause of your condition and whether it is mild or severe. Treatment may include: Bladder training, such as: Learning to control the urge to urinate by following a schedule to urinate at regular intervals. Doing Kegel exercises to strengthen the pelvic floor muscles that support your bladder. Special devices, such as: Biofeedback. This uses sensors to help you become aware of your body's signals. Electrical stimulation. This uses electrodes placed inside the body (implanted) or outside the body. These electrodes send gentle pulses of electricity to strengthen the nerves or muscles that control the bladder. Women may use a plastic device, called a pessary, that fits into the vagina and supports the bladder. Medicines, such as: Antibiotics to treat bladder infection. Antispasmodics to stop the bladder from releasing urine at the wrong time. Tricyclic antidepressants to relax bladder muscles. Injections of botulinum toxin type A directly into the bladder tissue to relax bladder muscles. Surgery, such as: A device may be implanted to help manage the nerve signals that control urination. An electrode may be implanted to stimulate electrical signals in the bladder. A procedure may be done to change the shape of the bladder.  This is done only in very severe cases. Follow these instructions at home: Eating and drinking  Make diet or lifestyle changes recommended by your  health care provider. These may include: Drinking fluids throughout the day and not only with meals. Cutting down on caffeine or alcohol. Eating a healthy and balanced diet to prevent constipation. This may include: Choosing foods that are high in fiber, such as beans, whole grains, and fresh fruits and vegetables. Limiting foods that are high in fat and processed sugars, such as fried and sweet foods. Lifestyle  Lose weight if needed. Do not use any products that contain nicotine or tobacco. These include cigarettes, chewing tobacco, and vaping devices, such as e-cigarettes. If you need help quitting, ask your health care provider. General instructions Take over-the-counter and prescription medicines only as told by your health care provider. If you were prescribed an antibiotic medicine, take it as told by your health care provider. Do not stop taking the antibiotic even if you start to feel better. Use any implants or pessary as told by your health care provider. If needed, wear pads to absorb urine leakage. Keep a log to track how much and when you drink, and when you need to urinate. This will help your health care provider monitor your condition. Keep all follow-up visits. This is important. Contact a health care provider if: You have a fever or chills. Your symptoms do not get better with treatment. Your pain and discomfort get worse. You have more frequent urges to urinate. Get help right away if: You are not able to control your bladder. Summary Overactive bladder refers to a condition in which a person has a sudden and frequent need to urinate. Several conditions may lead to an overactive bladder. Treatment for overactive bladder depends on the cause and severity of your condition. Making lifestyle changes, doing Kegel exercises, keeping a log, and taking medicines can help with this condition. This information is not intended to replace advice given to you by your health care  provider. Make sure you discuss any questions you have with your health care provider. Document Revised: 05/08/2020 Document Reviewed: 05/08/2020 Elsevier Patient Education  [place].  Urinary Incontinence Urinary incontinence refers to a condition in which a person is unable to control where and when to pass urine. A person with this condition will urinate involuntarily. This means that the person urinates when he or she does not mean to. What are the causes? This condition may be caused by: Medicines. Infections. Constipation. Overactive bladder muscles. Weak bladder muscles. Weak pelvic floor muscles. These muscles provide support for the bladder, intestine, and, in women, the uterus. Enlarged prostate in men. The prostate is a gland near the bladder. When it gets too big, it can pinch the urethra. With the urethra blocked, the bladder can weaken and lose the ability to empty properly. Surgery. Emotional factors, such as anxiety, stress, or post-traumatic stress disorder (PTSD). Spinal cord injury, nerve injury, or other neurological conditions. Pelvic organ prolapse. This happens in women when organs move out of place and into the vagina. This movement can prevent the bladder and urethra from working properly. What increases the risk? The following factors may make you more likely to develop this condition: Age. The older you are, the higher the risk. Obesity. Being physically inactive. Pregnancy and childbirth. Menopause. Diseases that affect the nerves or spinal cord. Long-term, or chronic, coughing. This can increase pressure on the bladder and pelvic floor muscles. What are the  signs or symptoms? Symptoms may vary depending on the type of urinary incontinence you have. They include: A sudden urge to urinate, and passing urine involuntarily before you can get to a bathroom (urge incontinence). Suddenly passing urine when doing activities that force urine to pass, such  as coughing, laughing, exercising, or sneezing (stress incontinence). Needing to urinate often but urinating only a small amount, or constantly dribbling urine (overflow incontinence). Urinating because you cannot get to the bathroom in time due to a physical disability, such as arthritis or injury, or due to a communication or thinking problem, such as Alzheimer's disease (functional incontinence). How is this diagnosed? This condition may be diagnosed based on: Your medical history. A physical exam. Tests, such as: Urine tests. X-rays of your kidney and bladder. Ultrasound. CT scan. Cystoscopy. In this procedure, a health care provider inserts a tube with a light and camera (cystoscope) through the urethra and into the bladder to check for problems. Urodynamic testing. These tests assess how well the bladder, urethra, and sphincter can store and release urine. There are different types of urodynamic tests, and they vary depending on what the test is measuring. To help diagnose your condition, your health care provider may recommend that you keep a log of when you urinate and how much you urinate. How is this treated? Treatment for this condition depends on the type of incontinence that you have and its cause. Treatment may include: Lifestyle changes, such as: Quitting smoking. Maintaining a healthy weight. Staying active. Try to get 150 minutes of moderate-intensity exercise every week. Ask your health care provider which activities are safe for you. Eating a healthy diet. Avoid high-fat foods, like fried foods. Avoid refined carbohydrates like white bread and white rice. Limit how much alcohol and caffeine you drink. Increase your fiber intake. Healthy sources of fiber include beans, whole grains, and fresh fruits and vegetables. Behavioral changes, such as: Pelvic floor muscle exercises. Bladder training, such as lengthening the amount of time between bathroom breaks, or using the  bathroom at regular intervals. Using techniques to suppress bladder urges. This can include distraction techniques or controlled breathing exercises. Medicines, such as: Medicines to relax the bladder muscles and prevent bladder spasms. Medicines to help slow or prevent the growth of a man's prostate. Botox injections. These can help relax the bladder muscles. Treatments, such as: Using pulses of electricity to help change bladder reflexes (electrical nerve stimulation). For women, using a medical device to prevent urine leaks. This is a small, tampon-like, disposable device that is inserted into the urethra. Injecting collagen or carbon beads (bulking agents) into the urinary sphincter. These can help thicken tissue and close the bladder opening. Surgery. Follow these instructions at home: Lifestyle Limit alcohol and caffeine. These can fill your bladder quickly and irritate it. Keep yourself clean to help prevent odors and skin damage. Ask your health care provider about special skin creams and cleansers that can protect the skin from urine. Consider wearing pads or adult diapers. Make sure to change them regularly, and always change them right after experiencing incontinence. General instructions Take over-the-counter and prescription medicines only as told by your health care provider. Use the bathroom about every 3-4 hours, even if you do not feel the need to urinate. Try to empty your bladder completely every time. After urinating, wait a minute. Then try to urinate again. Make sure you are in a relaxed position while urinating. If your incontinence is caused by nerve problems, keep a log of  the medicines you take and the times you go to the bathroom. Keep all follow-up visits. This is important. Where to find more information [employer] and Digestive and Kidney Diseases: [employer] U[employer] Association: www.urologyhealth.org Contact a health care  provider if: You have pain that gets worse. Your incontinence gets worse. Get help right away if: You have a fever or chills. You are unable to urinate. You have redness in your groin area or down your legs. Summary Urinary incontinence refers to a condition in which a person is unable to control where and when to pass urine. This condition may be caused by medicines, infection, weak bladder muscles, weak pelvic floor muscles, enlargement of the prostate (in men), or surgery. Factors such as older age, obesity, pregnancy and childbirth, menopause, neurological diseases, and chronic coughing may increase your risk for developing this condition. Types of urinary incontinence include urge incontinence, stress incontinence, overflow incontinence, and functional incontinence. This condition is usually treated first with lifestyle and behavioral changes, such as quitting smoking, eating a healthier diet, and doing regular pelvic floor exercises. Other treatment options include medicines, bulking agents, medical devices, electrical nerve stimulation, or surgery. This information is not intended to replace advice given to you by your health care provider. Make sure you discuss any questions you have with your health care provider. Document Revised: 03/24/2020 Document Reviewed: 03/24/2020 Elsevier Patient Education  2022 [place].  Guillain-Barre Syndrome Guillain-Barr syndrome (GBS) is a rare disorder in which the body's disease-fighting system (immune system) attacks the nerves. This causes numbness and tingling. It can eventually develop into a serious condition and, in some cases, cause paralysis. GBS does not spread from person to person (is not contagious). Most people with GBS recover within a few months. However, some people may have muscle weakness that becomes permanent. What are the causes? The exact cause of GBS is not known. In most cases, GBS develops a few days or weeks after you  have an infection, such as: A stomach infection caused by Campylobacter bacteria. This type of infection usually causes diarrhea. An infection of the nose, throat, and upper airways (respiratory infection), such as a cold or the flu. A viral infection. Less commonly, GBS may be triggered by: Surgery. A vaccine. What are the signs or symptoms? The most common first symptoms are tingling, numbness, and weakness in the lower legs. Other symptoms may develop over hours, days, or weeks. These may include: Muscle weakness that spreads from the legs to the abdomen and then the arms. Aching or burning pain of the arms and legs. Inability to move the arms, legs, or both (paralysis). Weakness of muscles in the face. Trouble chewing or swallowing. Slurred speech. Difficulty breathing. How is this diagnosed? This condition may be diagnosed based on: Your symptoms and medical history. Your health care provider will ask about any recent infections you have had. A physical exam. A test that measures how quickly your nerves carry signals to your muscles (nerve conduction velocity test) and a test that measures how well the nerves and muscles communicate (electromyogram). Testing a sample of fluid that surrounds the spinal cord (lumbar puncture). How is this treated? If your symptoms are mild, you may be given medicines to control your symptoms. If your condition becomes severe, you may need to be treated at a hospital. At the hospital, treatment may include: Medicines. These help to improve numbness or tingling sensations in your arms and legs (paresthesia) caused by irritation of the nerves.  Plasma exchange (plasmapheresis). This is a procedure in which the antibodies that are attacking your nerves are removed from your blood. Getting proteins (immunoglobulins or antibodies) that slow down the immune system's attack on your nerves. These may be given through an IV line inserted into one of your  veins. Oxygen and breathing assistance. Treatment may also include physical therapy to help strengthen your muscles. After being treated in the hospital, you may be transferred to a rehabilitation center to get intensive physical therapy. Once your strength improves, you may continue to get physical therapy at home. Additional physical therapy may be needed for many months. Follow these instructions at home:  If physical therapy was prescribed, do exercises as told by your health care provider. Make sure you have the support you need. Someone may need to help you bathe, dress, and prepare meals until you regain your strength. Rest as needed. Return to your normal activities as told by your health care provider. Ask your health care provider what activities are safe for you. Take over-the-counter and prescription medicines only as told by your health care provider. Keep all follow-up visits. This is important. Where to find more information GBS/CIDP [employer]: www.gbs-cidp.org Contact a health care provider if you: Have new symptoms or your symptoms get worse. Do not feel like you are getting better or regaining strength. Do not feel safe or supported at home. Are having trouble coping with your recovery. Get help right away if you: Have trouble breathing or chest pain. Have trouble swallowing. Choke after eating or drinking. Develop a fever. Cannot move. Faint or feel dizzy. Have pain, swelling, warmth, or redness in an arm or leg. These symptoms may be an emergency. Get help right away. Call 911. Do not wait to see if the symptoms will go away. Do not drive yourself to the hospital. Summary Guillain-Barr syndrome (GBS) is a rare disorder in which the body's immune system attacks the nerves. GBS often requires treatment at a hospital. Treatment in the hospital may include medicines, plasmapheresis, getting proteins that slow down the immune system's attack on your  nerves, or oxygen and breathing assistance. Months of physical therapy may be needed until you regain your strength. This information is not intended to replace advice given to you by your health care provider. Make sure you discuss any questions you have with your health care provider. Document Revised: 03/13/2021 Document Reviewed: 03/13/2021 Elsevier Patient Education  [place].

## 2021-08-23 ENCOUNTER — Other Ambulatory Visit: Payer: Self-pay | Admitting: Internal Medicine

## 2021-08-23 ENCOUNTER — Other Ambulatory Visit (HOSPITAL_COMMUNITY): Payer: Self-pay

## 2021-08-23 ENCOUNTER — Encounter: Payer: Self-pay | Admitting: Nurse Practitioner

## 2021-08-23 DIAGNOSIS — K219 Gastro-esophageal reflux disease without esophagitis: Secondary | ICD-10-CM

## 2021-08-23 MED ORDER — FAMOTIDINE 20 MG PO TABS
20.0000 mg | ORAL_TABLET | Freq: Two times a day (BID) | ORAL | 11 refills | Status: DC | PRN
  Filled 2021-08-23: qty 60, 30d supply, fill #0

## 2021-08-28 ENCOUNTER — Encounter: Payer: Self-pay | Admitting: Internal Medicine

## 2021-09-07 ENCOUNTER — Telehealth: Payer: Self-pay | Admitting: Internal Medicine

## 2021-09-07 NOTE — Telephone Encounter (Signed)
Faxed signed ROI to Dr Everette [facility] requesting patient's nerve conduction study and all tests. Received confirmation of fax going through.   ROI sent to scan.

## 2021-09-13 ENCOUNTER — Other Ambulatory Visit (HOSPITAL_COMMUNITY): Payer: Self-pay

## 2021-09-13 ENCOUNTER — Ambulatory Visit (INDEPENDENT_AMBULATORY_CARE_PROVIDER_SITE_OTHER): Payer: 59 | Admitting: Internal Medicine

## 2021-09-13 ENCOUNTER — Encounter: Payer: Self-pay | Admitting: Internal Medicine

## 2021-09-13 ENCOUNTER — Other Ambulatory Visit: Payer: Self-pay

## 2021-09-13 VITALS — BP 122/76 | HR 65 | Temp 97.1°F | Ht 62.5 in | Wt 171.0 lb

## 2021-09-13 DIAGNOSIS — R17 Unspecified jaundice: Secondary | ICD-10-CM

## 2021-09-13 DIAGNOSIS — R1013 Epigastric pain: Secondary | ICD-10-CM | POA: Diagnosis not present

## 2021-09-13 DIAGNOSIS — K219 Gastro-esophageal reflux disease without esophagitis: Secondary | ICD-10-CM

## 2021-09-13 DIAGNOSIS — K449 Diaphragmatic hernia without obstruction or gangrene: Secondary | ICD-10-CM | POA: Diagnosis not present

## 2021-09-13 DIAGNOSIS — Z1211 Encounter for screening for malignant neoplasm of colon: Secondary | ICD-10-CM

## 2021-09-13 DIAGNOSIS — A770 Spotted fever due to Rickettsia rickettsii: Secondary | ICD-10-CM | POA: Diagnosis not present

## 2021-09-13 DIAGNOSIS — R6881 Early satiety: Secondary | ICD-10-CM | POA: Diagnosis not present

## 2021-09-13 DIAGNOSIS — E538 Deficiency of other specified B group vitamins: Secondary | ICD-10-CM | POA: Diagnosis not present

## 2021-09-13 DIAGNOSIS — E559 Vitamin D deficiency, unspecified: Secondary | ICD-10-CM

## 2021-09-13 LAB — COMPREHENSIVE METABOLIC PANEL
ALT: 24 U/L (ref 0–35)
AST: 29 U/L (ref 0–37)
Albumin: 4.3 g/dL (ref 3.5–5.2)
Alkaline Phosphatase: 95 U/L (ref 39–117)
BUN: 11 mg/dL (ref 6–23)
CO2: 29 mEq/L (ref 19–32)
Calcium: 10 mg/dL (ref 8.4–10.5)
Chloride: 100 mEq/L (ref 96–112)
Creatinine, Ser: 0.67 mg/dL (ref 0.40–1.20)
GFR: 102.27 mL/min (ref >60.00)
Glucose, Bld: 91 mg/dL (ref 70–99)
Potassium: 3.6 mEq/L (ref 3.5–5.1)
Sodium: 137 mEq/L (ref 135–145)
Total Bilirubin: 1.8 mg/dL — ABNORMAL HIGH (ref 0.2–1.2)
Total Protein: 8.6 g/dL — ABNORMAL HIGH (ref 6.0–8.3)

## 2021-09-13 LAB — CBC WITH DIFFERENTIAL/PLATELET
Basophils Absolute: 0 10*3/uL (ref 0.0–0.1)
Basophils Relative: 0.2 % (ref 0.0–3.0)
Eosinophils Absolute: 0 10*3/uL (ref 0.0–0.7)
Eosinophils Relative: 0.7 % (ref 0.0–5.0)
HCT: 42 % (ref 36.0–46.0)
Hemoglobin: 13.9 g/dL (ref 12.0–15.0)
Lymphocytes Relative: 46 % (ref 12.0–46.0)
Lymphs Abs: 2.3 10*3/uL (ref 0.7–4.0)
MCHC: 33.2 g/dL (ref 30.0–36.0)
MCV: 89.1 fl (ref 78.0–100.0)
Monocytes Absolute: 0.3 10*3/uL (ref 0.1–1.0)
Monocytes Relative: 5.8 % (ref 3.0–12.0)
Neutro Abs: 2.4 10*3/uL (ref 1.4–7.7)
Neutrophils Relative %: 47.3 % (ref 43.0–77.0)
Platelets: 325 10*3/uL (ref 150.0–400.0)
RBC: 4.71 Mil/uL (ref 3.87–5.11)
RDW: 14.1 % (ref 11.5–15.5)
WBC: 5.1 10*3/uL (ref 4.0–10.5)

## 2021-09-13 LAB — VITAMIN B12: Vitamin B-12: >1550 pg/mL — ABNORMAL HIGH (ref 211–911)

## 2021-09-13 LAB — VITAMIN D 25 HYDROXY (VIT D DEFICIENCY, FRACTURES): VITD: 32.02 ng/mL (ref 30.00–100.00)

## 2021-09-13 LAB — LIPASE: Lipase: 22 U/L (ref 11.0–59.0)

## 2021-09-13 LAB — BILIRUBIN, DIRECT: Bilirubin, Direct: 0.2 mg/dL (ref 0.0–0.3)

## 2021-09-13 MED ORDER — PANTOPRAZOLE SODIUM 20 MG PO TBEC
20.0000 mg | DELAYED_RELEASE_TABLET | Freq: Every day | ORAL | 0 refills | Status: DC
  Filled 2021-09-13 – 2022-09-06 (×3): qty 90, 90d supply, fill #0

## 2021-09-13 NOTE — Patient Instructions (Addendum)
Call [facility] for an appointment  [facility]  Phone Fax E-mail Address  [phone] Not available [address]   [place] [id number]    Hiatal Hernia A hiatal hernia occurs when part of the stomach slides above the muscle that separates the abdomen from the chest (diaphragm). A person can be born with a hiatal hernia (congenital), or it may develop over time. In almost all cases of hiatal hernia, only the top part of the stomach pushes through the diaphragm. Many people have a hiatal hernia with no symptoms. The larger the hernia, the more likely it is that you will have symptoms. In some cases, a hiatal hernia allows stomach acid to flow back into the tube that carries food from your mouth to your stomach (esophagus). This may cause heartburn symptoms. Severe heartburn symptoms may mean that you have developed a condition called gastroesophageal reflux disease (GERD). What are the causes? This condition is caused by a weakness in the opening (hiatus) where the esophagus passes through the diaphragm to attach to the upper part of the stomach. A person may be born with a weakness in the hiatus, or a weakness can develop over time. What increases the risk? This condition is more likely to develop in: Older people. Age is a major risk factor for a hiatal hernia, especially if you are over the age of 37. Pregnant women. People who are overweight. People who have frequent constipation. What are the signs or symptoms? Symptoms of this condition usually develop in the form of GERD symptoms. Symptoms include: Heartburn. Belching. Indigestion. Trouble swallowing. Coughing or wheezing. Sore throat. Hoarseness. Chest pain. Nausea and vomiting. How is this diagnosed? This condition may be diagnosed during testing for GERD. Tests that may be done include: X-rays of your stomach or chest. An upper gastrointestinal (GI) series. This is an X-ray exam of your GI tract  that is taken after you swallow a chalky liquid that shows up clearly on the X-ray. Endoscopy. This is a procedure to look into your stomach using a thin, flexible tube that has a tiny camera and light on the end of it. How is this treated? This condition may be treated by: Dietary and lifestyle changes to help reduce GERD symptoms. Medicines. These may include: Over-the-counter antacids. Medicines that make your stomach empty more quickly. Medicines that block the production of stomach acid (H2 blockers). Stronger medicines to reduce stomach acid (proton pump inhibitors). Surgery to repair the hernia, if other treatments are not helping. If you have no symptoms, you may not need treatment. Follow these instructions at home: Lifestyle and activity Do not use any products that contain nicotine or tobacco, such as cigarettes and e-cigarettes. If you need help quitting, ask your health care provider. Try to achieve and maintain a healthy body weight. Avoid putting pressure on your abdomen. Anything that puts pressure on your abdomen increases the amount of acid that may be pushed up into your esophagus. Avoid bending over, especially after eating. Raise the head of your bed by putting blocks under the legs. This keeps your head and esophagus higher than your stomach. Do not wear tight clothing around your chest or stomach. Try not to strain when having a bowel movement, when urinating, or when lifting heavy objects. Eating and drinking Avoid foods that can worsen GERD symptoms. These may include: Fatty foods, like fried foods. Citrus fruits, like oranges or lemon. Other foods and drinks that contain acid, like  orange juice or tomatoes. Spicy food. Chocolate. Eat frequent small meals instead of three large meals a day. This helps prevent your stomach from getting too full. Eat slowly. Do not lie down right after eating. Do not eat 1-2 hours before bed. Do not drink beverages with  caffeine. These include cola, coffee, cocoa, and tea. Do not drink alcohol. General instructions Take over-the-counter and prescription medicines only as told by your health care provider. Keep all follow-up visits as told by your health care provider. This is important. Contact a health care provider if: Your symptoms are not controlled with medicines or lifestyle changes. You are having trouble swallowing. You have coughing or wheezing that will not go away. Get help right away if: Your pain is getting worse. Your pain spreads to your arms, neck, jaw, teeth, or back. You have shortness of breath. You sweat for no reason. You feel sick to your stomach (nauseous) or you vomit. You vomit blood. You have bright red blood in your stools. You have black, tarry stools. Summary A hiatal hernia occurs when part of the stomach slides above the muscle that separates the abdomen from the chest (diaphragm). A person may be born with a weakness in the hiatus, or a weakness can develop over time. Symptoms of hiatal hernia may include heartburn, trouble swallowing, or sore throat. Management of hiatal hernia includes eating frequent small meals instead of three large meals a day. Get help right away if you vomit blood, have bright red blood in your stools, or have black, tarry stools. This information is not intended to replace advice given to you by your health care provider. Make sure you discuss any questions you have with your health care provider. Document Revised: 07/20/2020 Document Reviewed: 07/20/2020 Elsevier Patient Education  2022 [place].  Gastroesophageal Reflux Disease, Adult Gastroesophageal reflux (GER) happens when acid from the stomach flows up into the tube that connects the mouth and the stomach (esophagus). Normally, food travels down the esophagus and stays in the stomach to be digested. However, when a person has GER, food and stomach acid sometimes move back up into the  esophagus. If this becomes a more serious problem, the person may be diagnosed with a disease called gastroesophageal reflux disease (GERD). GERD occurs when the reflux: Happens often. Causes frequent or severe symptoms. Causes problems such as damage to the esophagus. When stomach acid comes in contact with the esophagus, the acid may cause inflammation in the esophagus. Over time, GERD may create small holes (ulcers) in the lining of the esophagus. What are the causes? This condition is caused by a problem with the muscle between the esophagus and the stomach (lower esophageal sphincter, or LES). Normally, the LES muscle closes after food passes through the esophagus to the stomach. When the LES is weakened or abnormal, it does not close properly, and that allows food and stomach acid to go back up into the esophagus. The LES can be weakened by certain dietary substances, medicines, and medical conditions, including: Tobacco use. Pregnancy. Having a hiatal hernia. Alcohol use. Certain foods and beverages, such as coffee, chocolate, onions, and peppermint. What increases the risk? You are more likely to develop this condition if you: Have an increased body weight. Have a connective tissue disorder. Take NSAIDs, such as ibuprofen. What are the signs or symptoms? Symptoms of this condition include: Heartburn. Difficult or painful swallowing and the feeling of having a lump in the throat. A bitter taste in the mouth. Bad breath  and having a large amount of saliva. Having an upset or bloated stomach and belching. Chest pain. Different conditions can cause chest pain. Make sure you see your health care provider if you experience chest pain. Shortness of breath or wheezing. Ongoing (chronic) cough or a nighttime cough. Wearing away of tooth enamel. Weight loss. How is this diagnosed? This condition may be diagnosed based on a medical history and a physical exam. To determine if you have  mild or severe GERD, your health care provider may also monitor how you respond to treatment. You may also have tests, including: A test to examine your stomach and esophagus with a small camera (endoscopy). A test that measures the acidity level in your esophagus. A test that measures how much pressure is on your esophagus. A barium swallow or modified barium swallow test to show the shape, size, and functioning of your esophagus. How is this treated? Treatment for this condition may vary depending on how severe your symptoms are. Your health care provider may recommend: Changes to your diet. Medicine. Surgery. The goal of treatment is to help relieve your symptoms and to prevent complications. Follow these instructions at home: Eating and drinking  Follow a diet as recommended by your health care provider. This may involve avoiding foods and drinks such as: Coffee and tea, with or without caffeine. Drinks that contain alcohol. Energy drinks and sports drinks. Carbonated drinks or sodas. Chocolate and cocoa. Peppermint and mint flavorings. Garlic and onions. Horseradish. Spicy and acidic foods, including peppers, chili powder, curry powder, vinegar, hot sauces, and barbecue sauce. Citrus fruit juices and citrus fruits, such as oranges, lemons, and limes. Tomato-based foods, such as red sauce, chili, salsa, and pizza with red sauce. Fried and fatty foods, such as donuts, french fries, potato chips, and high-fat dressings. High-fat meats, such as hot dogs and fatty cuts of red and white meats, such as rib eye steak, sausage, ham, and bacon. High-fat dairy items, such as whole milk, butter, and cream cheese. Eat small, frequent meals instead of large meals. Avoid drinking large amounts of liquid with your meals. Avoid eating meals during the 2-3 hours before bedtime. Avoid lying down right after you eat. Do not exercise right after you eat. Lifestyle  Do not use any products that  contain nicotine or tobacco. These products include cigarettes, chewing tobacco, and vaping devices, such as e-cigarettes. If you need help quitting, ask your health care provider. Try to reduce your stress by using methods such as yoga or meditation. If you need help reducing stress, ask your health care provider. If you are overweight, reduce your weight to an amount that is healthy for you. Ask your health care provider for guidance about a safe weight loss goal. General instructions Pay attention to any changes in your symptoms. Take over-the-counter and prescription medicines only as told by your health care provider. Do not take aspirin, ibuprofen, or other NSAIDs unless your health care provider told you to take these medicines. Wear loose-fitting clothing. Do not wear anything tight around your waist that causes pressure on your abdomen. Raise (elevate) the head of your bed about 6 inches (15 cm). You can use a wedge to do this. Avoid bending over if this makes your symptoms worse. Keep all follow-up visits. This is important. Contact a health care provider if: You have: New symptoms. Unexplained weight loss. Difficulty swallowing or it hurts to swallow. Wheezing or a persistent cough. A hoarse voice. Your symptoms do not improve with  treatment. Get help right away if: You have sudden pain in your arms, neck, jaw, teeth, or back. You suddenly feel sweaty, dizzy, or light-headed. You have chest pain or shortness of breath. You vomit and the vomit is green, yellow, or black, or it looks like blood or coffee grounds. You faint. You have stool that is red, bloody, or black. You cannot swallow, drink, or eat. These symptoms may represent a serious problem that is an emergency. Do not wait to see if the symptoms will go away. Get medical help right away. Call your local emergency services (911 in the [place] Do not drive yourself to the hospital. Summary Gastroesophageal reflux happens  when acid from the stomach flows up into the esophagus. GERD is a disease in which the reflux happens often, causes frequent or severe symptoms, or causes problems such as damage to the esophagus. Treatment for this condition may vary depending on how severe your symptoms are. Your health care provider may recommend diet and lifestyle changes, medicine, or surgery. Contact a health care provider if you have new or worsening symptoms. Take over-the-counter and prescription medicines only as told by your health care provider. Do not take aspirin, ibuprofen, or other NSAIDs unless your health care provider told you to do so. Keep all follow-up visits as told by your health care provider. This is important. This information is not intended to replace advice given to you by your health care provider. Make sure you discuss any questions you have with your health care provider. Document Revised: 02/28/2020 Document Reviewed: 02/28/2020 Elsevier Patient Education  2022 [place] for Gastroesophageal Reflux Disease, Adult When you have gastroesophageal reflux disease (GERD), the foods you eat and your eating habits are very important. Choosing the right foods can help ease the discomfort of GERD. Consider working with a dietitian to help you make healthy food choices. What are tips for following this plan? Reading food labels Look for foods that are low in saturated fat. Foods that have less than 5% of daily value (DV) of fat and 0 g of trans fats may help with your symptoms. Cooking Cook foods using methods other than frying. This may include baking, steaming, grilling, or broiling. These are all methods that do not need a lot of fat for cooking. To add flavor, try to use herbs that are low in spice and acidity. Meal planning  Choose healthy foods that are low in fat, such as fruits, vegetables, whole grains, low-fat dairy products, lean meats, fish, and poultry. Eat frequent, small  meals instead of three large meals each day. Eat your meals slowly, in a relaxed setting. Avoid bending over or lying down until 2-3 hours after eating. Limit high-fat foods such as fatty meats or fried foods. Limit your intake of fatty foods, such as oils, butter, and shortening. Avoid the following as told by your health care provider: Foods that cause symptoms. These may be different for different people. Keep a food diary to keep track of foods that cause symptoms. Alcohol. Drinking large amounts of liquid with meals. Eating meals during the 2-3 hours before bed. Lifestyle Maintain a healthy weight. Ask your health care provider what weight is healthy for you. If you need to lose weight, work with your health care provider to do so safely. Exercise for at least 30 minutes on 5 or more days each week, or as told by your health care provider. Avoid wearing clothes that fit tightly around your waist and  chest. Do not use any products that contain nicotine or tobacco. These products include cigarettes, chewing tobacco, and vaping devices, such as e-cigarettes. If you need help quitting, ask your health care provider. Sleep with the head of your bed raised. Use a wedge under the mattress or blocks under the bed frame to raise the head of the bed. Chew sugar-free gum after mealtimes. What foods should I eat? Eat a healthy, well-balanced diet of fruits, vegetables, whole grains, low-fat dairy products, lean meats, fish, and poultry. Each person is different. Foods that may trigger symptoms in one person may not trigger any symptoms in another person. Work with your health care provider to identify foods that are safe for you. The items listed above may not be a complete list of recommended foods and beverages. Contact a dietitian for more information. What foods should I avoid? Limiting some of these foods may help manage the symptoms of GERD. Everyone is different. Consult a dietitian or your health  care provider to help you identify the exact foods to avoid, if any. Fruits Any fruits prepared with added fat. Any fruits that cause symptoms. For some people this may include citrus fruits, such as oranges, grapefruit, pineapple, and lemons. Vegetables Deep-fried vegetables. [place] fries. Any vegetables prepared with added fat. Any vegetables that cause symptoms. For some people, this may include tomatoes and tomato products, chili peppers, onions and garlic, and horseradish. Grains Pastries or quick breads with added fat. Meats and other proteins High-fat meats, such as fatty beef or pork, hot dogs, ribs, ham, sausage, salami, and bacon. Fried meat or protein, including fried fish and fried chicken. Nuts and nut butters, in large amounts. Dairy Whole milk and chocolate milk. Sour cream. Cream. Ice cream. Cream cheese. Milkshakes. Fats and oils Butter. Margarine. Shortening. Ghee. Beverages Coffee and tea, with or without caffeine. Carbonated beverages. Sodas. Energy drinks. Fruit juice made with acidic fruits, such as orange or grapefruit. Tomato juice. Alcoholic drinks. Sweets and desserts Chocolate and cocoa. Donuts. Seasonings and condiments Pepper. Peppermint and spearmint. Added salt. Any condiments, herbs, or seasonings that cause symptoms. For some people, this may include curry, hot sauce, or vinegar-based salad dressings. The items listed above may not be a complete list of foods and beverages to avoid. Contact a dietitian for more information. Questions to ask your health care provider Diet and lifestyle changes are usually the first steps that are taken to manage symptoms of GERD. If diet and lifestyle changes do not improve your symptoms, talk with your health care provider about taking medicines. Where to find more information [employer] G[employer]intestinal [employer]: aboutgerd.org Summary When you have gastroesophageal reflux disease (GERD), food and lifestyle  choices may be very helpful in easing the discomfort of GERD. Eat frequent, small meals instead of three large meals each day. Eat your meals slowly, in a relaxed setting. Avoid bending over or lying down until 2-3 hours after eating. Limit high-fat foods such as fatty meats or fried foods. This information is not intended to replace advice given to you by your health care provider. Make sure you discuss any questions you have with your health care provider. Document Revised: 02/28/2020 Document Reviewed: 02/28/2020 Elsevier Patient Education  [place].

## 2021-09-13 NOTE — Progress Notes (Signed)
Chief Complaint  Patient presents with   Gastroesophageal Reflux   Abdominal Pain   F/u  1. Epigastric pain and right rib pain x 3-4 weeks ago lifting 3 gallons of water and felt a pop under ribs feels bloated, early satiety, h/o hiatal hernia increased GERD sx's on pepcid  Food is coming up and ab pain not worse with food    Review of Systems  Constitutional:  Negative for weight loss.  HENT:  Negative for hearing loss.   Eyes:  Negative for blurred vision.  Respiratory:  Negative for shortness of breath.   Cardiovascular:  Negative for chest pain.  Gastrointestinal:  Positive for abdominal pain and heartburn. Negative for blood in stool.       Appeitite loss  Genitourinary:  Negative for dysuria.  Musculoskeletal:  Negative for falls and joint pain.  Skin:  Negative for rash.  Neurological:  Negative for headaches.  Psychiatric/Behavioral:  Negative for depression.   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 Everette Rank neurology 07/10/21 no emg abnormality legs rec B12 +RMSF serology   LGSIL (low grade squamous intraepithelial dysplasia)    [id number]   Lichen planopilaris    scalp   OSA (obstructive sleep apnea)    Parapelvic renal cyst    Reflux    Sleep apnea    Past Surgical History:  Procedure Laterality Date   COLPOSCOPY     LAPAROSCOPIC VAGINAL HYSTERECTOMY WITH SALPINGO OOPHORECTOMY Bilateral 08/10/2018   Procedure: LAPAROSCOPIC ASSISTED VAGINAL HYSTERECTOMY WITH BILATERAL SALPINGO OOPHORECTOMY;  Surgeon: [name], MD;  Location: [facility];  Service: Gynecology;  Laterality: Bilateral;   TONSILLECTOMY AND ADENOIDECTOMY     Family History  Problem Relation Age of Onset   Hypertension Mother    Cancer Mother        LUNG CANCER/SMOKER   Depression Mother    Mental illness Mother    Lung cancer Mother    Liver cancer Mother    Diabetes Father    Hypertension Father    Heart disease Father    Hyperlipidemia Father    Stroke Father    Hypertension Brother    Diabetes Brother    Depression Brother    Hyperlipidemia Brother    Cancer Maternal Uncle        ESOPHAGEAL CA   Esophageal cancer Maternal Uncle    Diabetes Cousin    Heart disease Cousin    Cancer Maternal Grandfather        Brain   Brain cancer Maternal Grandfather    Stroke Paternal Grandmother    Depression Maternal Grandmother    Mental retardation Maternal Grandmother    Stroke Paternal Grandfather    Colon cancer Neg Hx    Colon polyps Neg Hx    Rectal cancer Neg Hx    Stomach cancer Neg Hx    Social History   Socioeconomic History   Marital  status: Divorced    Spouse name: Not on file   Number of children: 2   Years of education: Not on file   Highest education level: Not on file  Occupational History   Not on file  Tobacco Use   Smoking status: Former    Types: Cigarettes    Quit date: 09/02/1990    Years since quitting: 31.0   Smokeless tobacco: Never   Tobacco comments:    smoked socially in high school   Vaping Use   Vaping Use: Never used  Substance and Sexual Activity   Alcohol use: Not Currently    Alcohol/week: 0.0 standard drinks    Comment: occasionally    Drug use: No   Sexual activity: Not Currently    Birth control/protection: Surgical    Comment: 1st intercourse 51 yo-More than 5 partners-Vasectomy  Other Topics Concern   Not on file  Social History Narrative   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[employer]    12 grade education    Wears seat belt    Safe in relationship   No guns    Divorced    Has kids    Social  Determinants of Health   Financial Resource Strain: Not on file  Food Insecurity: Not on file  Transportation Needs: Not on file  Physical Activity: Not on file  Stress: Not on file  Social Connections: Not on file  Intimate Partner Violence: Not on file   Current Meds  Medication Sig   carvedilol (COREG) 3.125 MG tablet Take 1 tablet (3.125 mg total) by mouth 2 (two) times daily with a meal.   clobetasol (TEMOVATE) 0.05 % external solution SMARTSIG:1 Milliliter(s) Topical Daily   Cyanocobalamin (B-12) 2000 MCG TABS Take 1 tablet by mouth daily.   famotidine (PEPCID) 20 MG tablet Take 1 tablet (20 mg total) by mouth 2 (two) times daily as needed for reflux   hydroxychloroquine (PLAQUENIL) 200 MG tablet Take 200 mg by mouth 2 (two) times daily.   pantoprazole (PROTONIX) 20 MG tablet Take 1 tablet (20 mg total) by mouth daily. 30 minutes before food   Pitavastatin Calcium 2 MG TABS Take 1 tablet (2 mg total) by mouth daily.   Vitamin D, Ergocalciferol, (DRISDOL) 1.25 MG (50000 UNIT) CAPS capsule Take 1 capsule (50,000 Units total) by mouth every 7 (seven) days.   Allergies  Allergen Reactions   Codeine Shortness Of Breath    Breathing problems  - liquid narcotic pain med with codeine    Covid-19 (Mrna) Vaccine Other (See Comments)    Numbness, pins and needles sensation in the hands and feet    Crestor [Rosuvastatin]     Abdominal bloating elevated lfts     Erythromycin     GI upset    Levofloxacin     Heart racing    No results found for this or any previous visit (from the past 2160 hour(s)). Objective  Body mass index is 30.78 kg/m. Wt Readings from Last 3 Encounters:  09/13/21 171 lb (77.6 kg)  08/08/21 170 lb 3.2 oz (77.2 kg)  06/22/21 166 lb 9.6 oz (75.6 kg)   Temp Readings from Last 3 Encounters:  09/13/21 (!) 97.1 F (36.2 C) (Temporal)  08/08/21 (!) 97.2 F (36.2 C) (Temporal)  06/06/21 97.6 F (36.4 C)   BP Readings from Last 3 Encounters:  09/13/21 122/76   08/08/21 124/82  06/22/21 123/86   Pulse Readings from Last 3 Encounters:  09/13/21 65  08/08/21 76  06/22/21 64  Physical Exam Vitals and nursing note reviewed.  Constitutional:      Appearance: Normal appearance. She is well-developed and well-groomed.  HENT:     Head: Normocephalic and atraumatic.  Eyes:     Conjunctiva/sclera: Conjunctivae normal.     Pupils: Pupils are equal, round, and reactive to light.  Cardiovascular:     Rate and Rhythm: Normal rate and regular rhythm.     Heart sounds: Normal heart sounds. No murmur heard. Pulmonary:     Effort: Pulmonary effort is normal.     Breath sounds: Normal breath sounds.  Abdominal:     General: Abdomen is flat. Bowel sounds are normal.     Tenderness: There is no abdominal tenderness.  Musculoskeletal:        General: No tenderness.  Skin:    General: Skin is warm and dry.  Neurological:     General: No focal deficit present.     Mental Status: She is alert and oriented to person, place, and time. Mental status is at baseline.     Cranial Nerves: Cranial nerves 2-12 are intact.     Gait: Gait is intact.  Psychiatric:        Attention and Perception: Attention and perception normal.        Mood and Affect: Mood and affect normal.        Speech: Speech normal.        Behavior: Behavior normal. Behavior is cooperative.        Thought Content: Thought content normal.        Cognition and Memory: Cognition and memory normal.        Judgment: Judgment normal.    Assessment  Plan  Epigastric pain r/o GB disease - Plan: Ambulatory referral to Gastroenterology, US Abdomen Complete, Comprehensive metabolic panel, CBC with Differential/Platelet, Lipase, H Pylori, IGM, IGG, IGA AB, Urinalysis, Routine w reflex microscopic Consider EGD/colonoscopy  Early satiety - Plan: Ambulatory referral to Gastroenterology, US Abdomen Complete  Gastroesophageal reflux disease without esophagitis - Plan: Ambulatory referral to  Gastroenterology, pantoprazole (PROTONIX) 20 MG tablet With pepcid   Hiatal hernia - Plan: Ambulatory referral to Gastroenterology, US Abdomen Complete Pepcid and protonix 20 mg qd   Dyspepsia - Plan: H Pylori, IGM, IGG, IGA AB  Elevated bilirubin - Plan: Bilirubin, Direct  RMSF [facility] spotted fever) - Plan: [facility] spotted fvr abs pnl(IgG+IgM)  HM See last visit   Provider: Dr. Olivia Mackie McLean-Scocuzza-Internal Medicine

## 2021-09-13 NOTE — Progress Notes (Signed)
[name]  Please when you talk to pt inform to call Dr. Rod Mae office and sch echo 01/2022 and f/u with him after echo   Thank you

## 2021-09-14 ENCOUNTER — Ambulatory Visit
Admission: RE | Admit: 2021-09-14 | Discharge: 2021-09-14 | Disposition: A | Discharge status: Home / Self Care | Payer: 59 | Source: Ambulatory Visit | Attending: Internal Medicine | Admitting: Internal Medicine

## 2021-09-14 ENCOUNTER — Encounter: Payer: Self-pay | Admitting: Internal Medicine

## 2021-09-14 DIAGNOSIS — R1013 Epigastric pain: Secondary | ICD-10-CM | POA: Diagnosis not present

## 2021-09-14 DIAGNOSIS — K449 Diaphragmatic hernia without obstruction or gangrene: Secondary | ICD-10-CM

## 2021-09-14 DIAGNOSIS — R6881 Early satiety: Secondary | ICD-10-CM

## 2021-09-14 DIAGNOSIS — K760 Fatty (change of) liver, not elsewhere classified: Secondary | ICD-10-CM | POA: Diagnosis not present

## 2021-09-14 LAB — URINALYSIS, ROUTINE W REFLEX MICROSCOPIC
Bilirubin Urine: NEGATIVE
Glucose, UA: NEGATIVE
Hgb urine dipstick: NEGATIVE
Ketones, ur: NEGATIVE
Leukocytes,Ua: NEGATIVE
Nitrite: NEGATIVE
Protein, ur: NEGATIVE
Specific Gravity, Urine: 1.003 (ref 1.001–1.035)
pH: 7 (ref 5.0–8.0)

## 2021-09-15 LAB — H PYLORI, IGM, IGG, IGA AB
H pylori, IgM Abs: <9 U (ref 0.0–8.9)
H. pylori, IgA Abs: <9 U (ref 0.0–8.9)
H. pylori, IgG AbS: 0.13 {index_val} (ref 0.00–0.79)

## 2021-09-15 LAB — ROCKY MTN SPOTTED FVR AB, IGG-BLOOD: RMSF IgG: NEGATIVE

## 2021-09-17 ENCOUNTER — Telehealth: Payer: Self-pay | Admitting: Internal Medicine

## 2021-09-17 NOTE — Telephone Encounter (Signed)
Arianna I cant find the test code for RMSF titer but this was done by neurology 4 months ago and pt wanted it retested  Can you call neurology see [facility] test code for RMSF IgM and IgG titer test they did 4 months ago for patient and order this please?  OR  See if can be added on to labs pt had 4 days ago Thank you  Neurology Diagnoses   Weakness of both lower extremities     Procedures   NCV/EMG/Ultrasound   Karl Luke, MD   [address]   [address]   [place], [place] [address]   Phone: [phone]   Fax: [phone]   [facility] [phone] [facility]   [address]   [place], [place] [address]   Phone: [phone]   Fax: [phone]

## 2021-09-17 NOTE — Addendum Note (Signed)
Addended by: Leeanne Rio on: 09/17/2021 04:22 PM   Modules accepted: Orders

## 2021-09-17 NOTE — Telephone Encounter (Signed)
Called and the neurology office is closed today for the holiday. Will pend message and call again tomorrow. No option to leave a non-urgent voicemail.

## 2021-09-21 ENCOUNTER — Other Ambulatory Visit (HOSPITAL_COMMUNITY): Payer: Self-pay

## 2021-09-26 ENCOUNTER — Ambulatory Visit: Payer: 59 | Admitting: Obstetrics and Gynecology

## 2021-09-26 NOTE — Progress Notes (Deleted)
[facility] New Patient Evaluation and Consultation  Referring Provider: McLean-Scocuzza, Olivia Mackie * PCP: McLean-Scocuzza, Nino Glow, MD Date of Service: 09/26/2021  SUBJECTIVE Chief Complaint: No chief complaint on file.  History of Present Illness: [name] is a 51 y.o. White or Caucasian female seen in consultation at the request of Dr. Terese Door for evaluation of incontinence.    Review of records Dr Aundra Dubin- Scocuzza significant for: Has both stress and urge incontinence. No dysuria. S/p hysterectomy  Urinary Symptoms: {urine leakage?:24754} Leaks *** time(s) per {days/wks/mos/yrs:310907}.  Pad use: {NUMBERS 1-10:18281} {pad option:24752} per day.   She {ACTION; IS/IS RUE:45409811} bothered by her UI symptoms.  Day time voids ***.  Nocturia: *** times per night to void. Voiding dysfunction: she {empties:24755} her bladder well.  {DOES NOT does:27190::"does not"} use a catheter to empty bladder.  When urinating, she feels {urine symptoms:24756} Drinks: *** per day  UTIs: {NUMBERS 1-10:18281} UTI's in the last year.   {ACTIONS;DENIES/REPORTS:21021675::"Denies"} history of {urologic concerns:24757}  Pelvic Organ Prolapse Symptoms:                  She {denies/ admits to:24761} a feeling of a bulge the vaginal area. It has been present for {NUMBER 1-10:22536} {days/wks/mos/yrs:310907}.  She {denies/ admits to:24761} seeing a bulge.  This bulge {ACTION; IS/IS BJY:78295621} bothersome.  Bowel Symptom: Bowel movements: *** time(s) per {Time; day/week/month:13537} Stool consistency: {stool consistency:24758} Straining: {yes/no:19897}.  Splinting: {yes/no:19897}.  Incomplete evacuation: {yes/no:19897}.  She {denies/ admits to:24761} accidental bowel leakage / fecal incontinence  Occurs: *** time(s) per {Time; day/week/month:13537}  Consistency with leakage: {stool consistency:24758} Bowel regimen: {bowel regimen:24759} Last colonoscopy: Date ***, Results  ***  Sexual Function Sexually active: {yes/no:19897}.  Sexual orientation: {Sexual Orientation:[phone]} Pain with sex: {pain with sex:24762}  Pelvic Pain {denies/ admits to:24761} pelvic pain Location: *** Pain occurs: *** Prior pain treatment: *** Improved by: *** Worsened by: ***   Past Medical History:  Past Medical History:  Diagnosis Date   Allergy    Anemia    past hx - corrected after hysterectomy    Anxiety    past hx    Aortic insufficiency    ASCUS favoring benign 12/2015   negative high risk HPV   Chicken pox    COVID-19 december2020, 09-13-2020   GERD (gastroesophageal reflux disease)    during pregnancy    Headache    frequent 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 Everette Rank neurology 07/10/21 no emg abnormality legs rec B12 +RMSF serology   LGSIL (low grade squamous intraepithelial dysplasia)    [id number]   Lichen planopilaris    scalp   OSA (obstructive sleep apnea)    Parapelvic renal cyst    Reflux    Sleep apnea      Past Surgical History:   Past Surgical History:  Procedure Laterality Date   COLPOSCOPY     LAPAROSCOPIC VAGINAL HYSTERECTOMY WITH SALPINGO OOPHORECTOMY Bilateral 08/10/2018   Procedure: LAPAROSCOPIC ASSISTED VAGINAL HYSTERECTOMY WITH BILATERAL SALPINGO OOPHORECTOMY;  Surgeon: [name], MD;  Location: [facility];  Service: Gynecology;  Laterality: Bilateral;   TONSILLECTOMY AND ADENOIDECTOMY       Past OB/GYN History: G{NUMBERS 1-10:18281} P{NUMBERS 1-10:18281} Vaginal deliveries: ***,  Forceps/ Vacuum deliveries: ***, Cesarean section: *** Menopausal: {menopausal:24763} Contraception: ***. Last pap smear was ***.  Any history of abnormal pap smears: {yes/no:19897}.  Medications: She has a current medication list which includes the following prescription(s): carvedilol, clobetasol, b-12, famotidine, hydroxychloroquine,  pantoprazole, pitavastatin calcium, and vitamin d (ergocalciferol).   Allergies: Patient is allergic to codeine, covid-19 (mrna) vaccine, crestor [rosuvastatin], erythromycin, and levofloxacin.   Social History:  Social History   Tobacco Use   Smoking status: Former    Types: Cigarettes    Quit date: 09/02/1990    Years since quitting: 31.0   Smokeless tobacco: Never   Tobacco comments:    smoked socially in high school   Vaping Use   Vaping Use: Never used  Substance Use Topics   Alcohol use: Not Currently    Alcohol/week: 0.0 standard drinks    Comment: occasionally    Drug use: No    Relationship status: {relationship status:24764} She lives with ***.   She {ACTION; IS/IS ZOX:09604540} employed ***. Regular exercise: {Yes/No:304960894} History of abuse: {Yes/No:304960894}  Family History:   Family History  Problem Relation Age of Onset   Hypertension Mother    Cancer Mother        LUNG CANCER/SMOKER   Depression Mother    Mental illness Mother    Lung cancer Mother    Liver cancer Mother    Diabetes Father    Hypertension Father    Heart disease Father    Hyperlipidemia Father    Stroke Father    Hypertension Brother    Diabetes Brother    Depression Brother    Hyperlipidemia Brother    Cancer Maternal Uncle        ESOPHAGEAL CA   Esophageal cancer Maternal Uncle    Diabetes Cousin    Heart disease Cousin    Cancer Maternal Grandfather        Brain   Brain cancer Maternal Grandfather    Stroke Paternal Grandmother    Depression Maternal Grandmother    Mental retardation Maternal Grandmother    Stroke Paternal Grandfather    Colon cancer Neg Hx    Colon polyps Neg Hx    Rectal cancer Neg Hx    Stomach cancer Neg Hx      Review of Systems: ROS   OBJECTIVE Physical Exam: There were no vitals filed for this visit.  Physical Exam   GU / Detailed Urogynecologic Evaluation:  Pelvic Exam: Normal external female genitalia; Bartholin's and Skene's  glands normal in appearance; urethral meatus normal in appearance, no urethral masses or discharge.   CST: {gen negative/positive:315881}  Reflexes: bulbocavernosis {DESC; PRESENT/NOT PRESENT:21021351}, anocutaneous {DESC; PRESENT/NOT PRESENT:21021351} ***bilaterally.  Speculum exam reveals normal vaginal mucosa {With/Without:20273} atrophy. Cervix {exam; gyn cervix:30847}. Uterus {exam; pelvic uterus:30849}. Adnexa {exam; adnexa:12223}.    s/p hysterectomy: Speculum exam reveals normal vaginal mucosa {With/Without:20273}  atrophy and normal vaginal cuff.  Adnexa {exam; adnexa:12223}.    With apex supported, anterior compartment defect was {reduced:24765}  Pelvic floor strength {Roman # I-V:19040}/V, puborectalis {Roman # I-V:19040}/V external anal sphincter {Roman # I-V:19040}/V  Pelvic floor musculature: Right levator {Tender/Non-tender:20250}, Right obturator {Tender/Non-tender:20250}, Left levator {Tender/Non-tender:20250}, Left obturator {Tender/Non-tender:20250}  POP-Q:   POP-Q                                               Aa  Ba                                                 C                                                Gh                                               Pb                                               tvl                                                Ap                                               Bp                                                 D     Rectal Exam:  Normal sphincter tone, {rectocele:24766} distal rectocele, enterocoele {DESC; PRESENT/NOT PRESENT:21021351}, no rectal masses, {sign of:24767} dyssynergia when asking the patient to bear down.  Post-Void Residual (PVR) by Bladder Scan: In order to evaluate bladder emptying, we discussed obtaining a postvoid residual and she agreed to this procedure.  Procedure: The ultrasound unit was placed on the patient's abdomen in the suprapubic  region after the patient had voided. A PVR of *** ml was obtained by bladder scan.  Laboratory Results: @ENCLABS @   ***I visualized the urine specimen, noting the specimen to be {urine color:24768}  ASSESSMENT AND PLAN Ms. Pendergraph is a 51 y.o. with: No diagnosis found.    Jaquita Folds, MD   Medical Decision Making:  - Reviewed/ ordered a clinical laboratory test - Reviewed/ ordered a radiologic study - Reviewed/ ordered medicine test - Decision to obtain old records - Discussion of management of or test interpretation with an external physician / other healthcare professional  - Assessment requiring independent historian - Review and summation of prior records - Independent review of image, tracing or specimen

## 2021-09-28 ENCOUNTER — Other Ambulatory Visit (HOSPITAL_COMMUNITY): Payer: Self-pay

## 2021-10-09 LAB — ROCKY MTN SPOTTED FVR ABS PNL(IGG+IGM)
RMSF IgG: NEGATIVE
RMSF IgM: 0.47 index (ref 0.00–0.89)

## 2021-10-09 LAB — SPECIMEN STATUS REPORT

## 2021-10-12 ENCOUNTER — Other Ambulatory Visit (HOSPITAL_COMMUNITY): Payer: Self-pay

## 2021-10-24 ENCOUNTER — Encounter: Payer: Self-pay | Admitting: Internal Medicine

## 2021-11-12 ENCOUNTER — Ambulatory Visit (INDEPENDENT_AMBULATORY_CARE_PROVIDER_SITE_OTHER): Payer: Self-pay | Admitting: Adult Health

## 2021-11-12 ENCOUNTER — Encounter: Payer: Self-pay | Admitting: Adult Health

## 2021-11-12 DIAGNOSIS — Z91199 Patient's noncompliance with other medical treatment and regimen due to unspecified reason: Secondary | ICD-10-CM | POA: Insufficient documentation

## 2021-11-12 NOTE — Progress Notes (Signed)
No show for scheduled appointment 11/12/2021 at 8 AM with nurse practitioner Laverna Peace  ?

## 2021-11-26 ENCOUNTER — Encounter: Payer: Self-pay | Admitting: *Deleted

## 2021-11-26 NOTE — Progress Notes (Signed)
Mychart sent.

## 2021-12-03 ENCOUNTER — Other Ambulatory Visit (HOSPITAL_COMMUNITY): Payer: Self-pay

## 2021-12-06 DIAGNOSIS — R17 Unspecified jaundice: Secondary | ICD-10-CM | POA: Diagnosis not present

## 2021-12-06 DIAGNOSIS — E785 Hyperlipidemia, unspecified: Secondary | ICD-10-CM | POA: Diagnosis not present

## 2021-12-06 LAB — HEPATIC FUNCTION PANEL
ALT: 16 IU/L (ref 0–32)
AST: 25 IU/L (ref 0–40)
Albumin: 4.7 g/dL (ref 3.8–4.8)
Alkaline Phosphatase: 103 IU/L (ref 44–121)
Bilirubin Total: 1.1 mg/dL (ref 0.0–1.2)
Bilirubin, Direct: 0.23 mg/dL (ref 0.00–0.40)
Total Protein: 7.5 g/dL (ref 6.0–8.5)

## 2021-12-06 LAB — LIPID PANEL
Chol/HDL Ratio: 2.4 ratio (ref 0.0–4.4)
Cholesterol, Total: 151 mg/dL (ref 100–199)
HDL: 62 mg/dL (ref >39)
LDL Chol Calc (NIH): 76 mg/dL (ref 0–99)
Triglycerides: 66 mg/dL (ref 0–149)
VLDL Cholesterol Cal: 13 mg/dL (ref 5–40)

## 2021-12-06 LAB — BILIRUBIN, FRACTIONATED(TOT/DIR/INDIR): Bilirubin, Indirect: 0.87 mg/dL — ABNORMAL HIGH (ref 0.10–0.80)

## 2021-12-21 ENCOUNTER — Other Ambulatory Visit (HOSPITAL_BASED_OUTPATIENT_CLINIC_OR_DEPARTMENT_OTHER): Payer: Self-pay

## 2021-12-21 DIAGNOSIS — H10411 Chronic giant papillary conjunctivitis, right eye: Secondary | ICD-10-CM | POA: Diagnosis not present

## 2021-12-21 DIAGNOSIS — H2000 Unspecified acute and subacute iridocyclitis: Secondary | ICD-10-CM | POA: Diagnosis not present

## 2021-12-21 MED ORDER — PREDNISOLONE ACETATE 1 % OP SUSP
OPHTHALMIC | 0 refills | Status: DC
  Filled 2021-12-21: qty 5, 25d supply, fill #0

## 2022-01-02 ENCOUNTER — Other Ambulatory Visit (HOSPITAL_COMMUNITY): Payer: Self-pay

## 2022-01-15 ENCOUNTER — Encounter: Payer: 59 | Admitting: Internal Medicine

## 2022-01-22 ENCOUNTER — Encounter (HOSPITAL_COMMUNITY): Payer: Self-pay | Admitting: Internal Medicine

## 2022-02-04 ENCOUNTER — Telehealth (HOSPITAL_COMMUNITY): Payer: Self-pay | Admitting: Internal Medicine

## 2022-02-04 NOTE — Telephone Encounter (Signed)
Just an FYI. We have made several attempts to contact this patient including sending a letter to schedule or reschedule their echocardiogram. We will be removing the patient from the echo [place].  01/22/22 MAILED LETTER [facility]  01/22/22 [facility] to schedule X 3 @ 9:03/[facility]  01/17/22 [facility] to schedule @ 8:51/[facility]  01/14/22 [facility] to schedule @ 10:08/[facility]    Thank you

## 2022-02-18 ENCOUNTER — Encounter: Payer: Self-pay | Admitting: Nurse Practitioner

## 2022-02-18 ENCOUNTER — Other Ambulatory Visit (HOSPITAL_COMMUNITY): Payer: Self-pay

## 2022-02-18 MED ORDER — AZITHROMYCIN 250 MG PO TABS
ORAL_TABLET | ORAL | 0 refills | Status: AC
  Filled 2022-02-18: qty 6, 5d supply, fill #0

## 2022-02-18 MED ORDER — NEOMYCIN-POLYMYXIN-DEXAMETH 3.5-10000-0.1 OP SUSP
OPHTHALMIC | 0 refills | Status: AC
  Filled 2022-02-18: qty 5, 14d supply, fill #0

## 2022-02-25 ENCOUNTER — Other Ambulatory Visit (HOSPITAL_COMMUNITY): Payer: Self-pay

## 2022-02-28 ENCOUNTER — Other Ambulatory Visit (HOSPITAL_COMMUNITY): Payer: Self-pay

## 2022-06-11 ENCOUNTER — Encounter: Payer: Self-pay | Admitting: Nurse Practitioner

## 2022-06-11 ENCOUNTER — Other Ambulatory Visit (HOSPITAL_COMMUNITY): Payer: Self-pay

## 2022-06-14 ENCOUNTER — Encounter: Payer: Self-pay | Admitting: Nurse Practitioner

## 2022-06-14 ENCOUNTER — Other Ambulatory Visit (HOSPITAL_COMMUNITY): Payer: Self-pay

## 2022-06-15 ENCOUNTER — Other Ambulatory Visit (HOSPITAL_COMMUNITY): Payer: Self-pay

## 2022-07-16 ENCOUNTER — Other Ambulatory Visit (HOSPITAL_COMMUNITY): Payer: Self-pay

## 2022-07-16 ENCOUNTER — Other Ambulatory Visit: Payer: Self-pay

## 2022-07-16 ENCOUNTER — Encounter: Payer: Self-pay | Admitting: Nurse Practitioner

## 2022-07-16 MED ORDER — NEOMYCIN-POLYMYXIN-DEXAMETH 3.5-10000-0.1 OP SUSP
OPHTHALMIC | 0 refills | Status: DC
  Filled 2022-07-16 (×2): qty 5, 7d supply, fill #0

## 2022-07-17 ENCOUNTER — Other Ambulatory Visit (HOSPITAL_COMMUNITY): Payer: Self-pay

## 2022-07-18 ENCOUNTER — Other Ambulatory Visit: Payer: Self-pay

## 2022-07-24 ENCOUNTER — Other Ambulatory Visit (HOSPITAL_COMMUNITY): Payer: Self-pay

## 2022-07-31 ENCOUNTER — Encounter (HOSPITAL_COMMUNITY): Payer: Self-pay | Admitting: Emergency Medicine

## 2022-07-31 ENCOUNTER — Emergency Department (HOSPITAL_COMMUNITY)
Admission: EM | Admit: 2022-07-31 | Discharge: 2022-07-31 | Disposition: A | Discharge status: Home / Self Care | Payer: Managed Care, Other (non HMO) | Attending: Emergency Medicine | Admitting: Emergency Medicine

## 2022-07-31 ENCOUNTER — Encounter: Payer: Self-pay | Admitting: Nurse Practitioner

## 2022-07-31 ENCOUNTER — Emergency Department (HOSPITAL_COMMUNITY): Payer: Managed Care, Other (non HMO)

## 2022-07-31 DIAGNOSIS — I10 Essential (primary) hypertension: Secondary | ICD-10-CM | POA: Insufficient documentation

## 2022-07-31 DIAGNOSIS — M79605 Pain in left leg: Secondary | ICD-10-CM | POA: Diagnosis present

## 2022-07-31 DIAGNOSIS — R2242 Localized swelling, mass and lump, left lower limb: Secondary | ICD-10-CM | POA: Diagnosis not present

## 2022-07-31 NOTE — Progress Notes (Addendum)
Lower extremity venous left study completed.  Preliminary results relayed to Alvino Chapel, MD.  See CV Proc for preliminary results report.   Darlin Coco, RDMS, RVT

## 2022-07-31 NOTE — ED Provider Notes (Signed)
[facility]ERG[facility] DEPARTMENT Provider Note   CSN: [id number] Arrival date & time: 07/31/22  1540     History  Chief Complaint  Patient presents with   Leg Pain    [name] is a 51 y.o. female.   Leg Pain    Patient with medical history of hyperlipidemia, GERD, hypertension presents to the emergency department due to left calf abnormality.  States she was showering earlier this morning and noticed a knot in her left calf, states it was more erythematous than it currently is.  It is not painful, states she has prolonged periods of immobilization working for [employer].  No history of clots, denies any chest pain or feeling short of breath.  Not on blood thinners.  Home Medications Prior to Admission medications   Medication Sig Start Date End Date Taking? Authorizing Provider  carvedilol (COREG) 3.125 MG tablet Take 1 tablet (3.125 mg total) by mouth 2 (two) times daily with a meal. 06/22/21   Kent, Renee Corwin, MD  clobetasol (TEMOVATE) 0.05 % external solution SMARTSIG:1 Milliliter(s) Topical Daily 02/15/20   [provider]  Cyanocobalamin (B-12) 2000 MCG TABS Take 1 tablet by mouth daily. 08/08/21   Kent, Renee Glow, MD  famotidine (PEPCID) 20 MG tablet Take 1 tablet (20 mg total) by mouth 2 (two) times daily as needed for reflux 08/23/21   Kent, Renee Glow, MD  hydroxychloroquine (PLAQUENIL) 200 MG tablet Take 200 mg by mouth 2 (two) times daily. 02/23/20   [provider]  neomycin-polymyxin b-dexamethasone (MAXITROL) 3.5-10000-0.1 SUSP Apply 3 times a day to affected areas for 7 days then discontinue 07/16/22     pantoprazole (PROTONIX) 20 MG tablet Take 1 tablet (20 mg total) by mouth daily. 30 minutes before food 09/13/21   Kent, Renee Glow, MD  Pitavastatin Calcium 2 MG TABS Take 1 tablet (2 mg total) by mouth daily. 06/22/21   Kent, Renee Corwin, MD  prednisoLONE acetate (PRED FORTE) 1 % ophthalmic suspension  Place 1 drop into both eyes once daily for 5 days, then place 1 drop into both eyes 2 times daily for 5 days. 12/21/21     Vitamin D, Ergocalciferol, (DRISDOL) 1.25 MG (50000 UNIT) CAPS capsule Take 1 capsule (50,000 Units total) by mouth every 7 (seven) days. 08/08/21   Kent, Renee Glow, MD      Allergies    Codeine, Covid-19 (mrna) vaccine, Crestor [rosuvastatin], Erythromycin, and Levofloxacin    Review of Systems   Review of Systems  Physical Exam Updated Vital Signs BP (!) 168/94   Pulse 84   Temp (!) 97.3 F (36.3 C) (Oral)   Resp 18   Ht 5' 2.5" (1.588 m)   Wt 77.6 kg   LMP 08/02/2018 (Exact Date)   SpO2 99%   BMI 30.79 kg/m  Physical Exam Vitals and nursing note reviewed. Exam conducted with a chaperone present.  Constitutional:      General: She is not in acute distress.    Appearance: Normal appearance.  HENT:     Head: Normocephalic and atraumatic.  Eyes:     General: No scleral icterus.    Extraocular Movements: Extraocular movements intact.     Pupils: Pupils are equal, round, and reactive to light.  Cardiovascular:     Pulses: Normal pulses.  Musculoskeletal:        General: No tenderness.  Skin:    Capillary Refill: Capillary refill takes less than 2 seconds.     Coloration: Skin is  not jaundiced.     Comments: Less than 2 mm mobile soft area of the left calf possibly lipoma.  There is no skin discoloration or erythema.  No warmth  Neurological:     Mental Status: She is alert. Mental status is at baseline.     Coordination: Coordination normal.     ED Results / Procedures / Treatments   Labs (all labs ordered are listed, but only abnormal results are displayed) Labs Reviewed - No data to display  EKG None  Radiology VAS [place] LOWER EXTREMITY VENOUS (DVT) (7a-7p)  Result Date: 07/31/2022  Lower Venous DVT Study Patient Name:  [name]  Date of Exam:   07/31/2022 Medical Rec #: [id number]        Accession #:    [id number] Date of  Birth: 1971/06/30        Patient Gender: F Patient Age:   40 years Exam Location:  [facility] Procedure:      VAS [place] LOWER EXTREMITY VENOUS (DVT) Referring Phys: [name] --------------------------------------------------------------------------------  Indications: Palpable area left calf, redness. Other Indications: Family history of DVT per patient. Comparison Study: 12-03-2019 Prior left lower extremity venous study was                   negative for DVT. Performing Technologist: [name] RDMS, RVT  Examination Guidelines: A complete evaluation includes B-mode imaging, spectral Doppler, color Doppler, and power Doppler as needed of all accessible portions of each vessel. Bilateral testing is considered an integral part of a complete examination. Limited examinations for reoccurring indications may be performed as noted. The reflux portion of the exam is performed with the patient in reverse Trendelenburg.  +-----+---------------+---------+-----------+----------+--------------+ RIGHTCompressibilityPhasicitySpontaneityPropertiesThrombus Aging +-----+---------------+---------+-----------+----------+--------------+ CFV  Full           Yes      Yes                                 +-----+---------------+---------+-----------+----------+--------------+   +---------+---------------+---------+-----------+----------+--------------+ LEFT     CompressibilityPhasicitySpontaneityPropertiesThrombus Aging +---------+---------------+---------+-----------+----------+--------------+ CFV      Full           Yes      Yes                                 +---------+---------------+---------+-----------+----------+--------------+ SFJ      Full                                                        +---------+---------------+---------+-----------+----------+--------------+ FV Prox  Full                                                         +---------+---------------+---------+-----------+----------+--------------+ FV Mid   Full                                                        +---------+---------------+---------+-----------+----------+--------------+  FV DistalFull                                                        +---------+---------------+---------+-----------+----------+--------------+ PFV      Full                                                        +---------+---------------+---------+-----------+----------+--------------+ POP      Full           Yes      Yes                                 +---------+---------------+---------+-----------+----------+--------------+ PTV      Full                                                        +---------+---------------+---------+-----------+----------+--------------+ PERO     Full                                                        +---------+---------------+---------+-----------+----------+--------------+ Soleal   Full                                                        +---------+---------------+---------+-----------+----------+--------------+ Gastroc  Full                                                        +---------+---------------+---------+-----------+----------+--------------+    Summary: RIGHT: - No evidence of common femoral vein obstruction.  LEFT: - There is no evidence of deep vein thrombosis in the lower extremity.  - No cystic structure found in the popliteal fossa.  - Incidental: Focal area of increased echogenicity at area of concern/palpation. Non-vascular. Consistent with ultrasound characteristics of lipoma.  *See table(s) above for measurements and observations.    Preliminary     Procedures Procedures    Medications Ordered in ED Medications - No data to display  ED Course/ Medical Decision Making/ A&P                           Medical Decision Making  Patient presents due to left Irregularity.   Differential includes but not limited to DVT, lipoma, mass.  On exam patient is neurovascularly intact with brisk pulses, cap refill less than 2.  There is no reproducible tenderness in the lower extremities roughly symmetric bilaterally.  There is no overlying erythema or warmth,  not consistent with cellulitis.  DVT study is negative for clot.  Patient follow-up with her PCP, possibly a lipoma but I do not see any emergent etiology.  Return precautions were discussed with the patient who verbalized understanding.  Patient is stable for outpatient follow-up at this time.        Final Clinical Impression(s) / ED Diagnoses Final diagnoses:  None    Rx / DC Orders ED Discharge Orders     None         Sherrill Raring, Hershal Coria 07/31/22 1815    Davonna Belling, MD 07/31/22 2333

## 2022-07-31 NOTE — Discharge Instructions (Addendum)
You are seen today in the emergency department due to left calf irregularity.  Your ultrasound was negative for clot, continue taking your home medications.  I suspect the bump could be a lipoma but I would recommend following up with your primary for additional workup as needed.  Return to the ED if you have chest pain, shortness of breath, new or concerning symptoms.  Otherwise enjoy your son's basketball game [place].

## 2022-07-31 NOTE — ED Triage Notes (Signed)
Pt worried about blood clot in left leg due to a knot under her skin on calf. Pt noticed the knot while in the shower today and says it was red. Pt denies any pain. 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

## 2022-07-31 NOTE — ED Provider Triage Note (Signed)
Emergency Medicine Provider Triage Evaluation Note  [name] , a 51 y.o. female  was evaluated in triage.  Pt complains of L leg discomfort.   Review of Systems  Positive: Left leg discomfort Negative: Fever  Physical Exam  BP (!) 168/94   Pulse 84   Temp (!) 97.3 F (36.3 C) (Oral)   Resp 18   Ht 5' 2.5" (1.588 m)   Wt 77.6 kg   LMP 08/02/2018 (Exact Date)   SpO2 99%   BMI 30.79 kg/m  Gen:   Awake, no distress   Resp:  Normal effort  MSK:   Moves extremities without difficulty  Other:  Legs are symmetric  Medical Decision Making  Medically screening exam initiated at 3:55 PM.  Appropriate orders placed.  [name] was informed that the remainder of the evaluation will be completed by another provider, this initial triage assessment does not replace that evaluation, and the importance of remaining in the ED until their evaluation is complete.     [name], [place] 07/31/22 1558

## 2022-08-09 ENCOUNTER — Telehealth: Payer: Self-pay

## 2022-08-12 ENCOUNTER — Encounter: Payer: Self-pay | Admitting: Nurse Practitioner

## 2022-08-15 ENCOUNTER — Encounter: Payer: Self-pay | Admitting: Nurse Practitioner

## 2022-08-17 ENCOUNTER — Encounter: Payer: Self-pay | Admitting: Nurse Practitioner

## 2022-09-06 ENCOUNTER — Other Ambulatory Visit (HOSPITAL_COMMUNITY): Payer: Self-pay

## 2022-09-06 ENCOUNTER — Other Ambulatory Visit: Payer: Self-pay

## 2022-09-06 ENCOUNTER — Encounter: Payer: Self-pay | Admitting: Nurse Practitioner

## 2022-09-12 ENCOUNTER — Other Ambulatory Visit (HOSPITAL_BASED_OUTPATIENT_CLINIC_OR_DEPARTMENT_OTHER): Payer: Self-pay | Admitting: Internal Medicine

## 2022-09-12 ENCOUNTER — Other Ambulatory Visit (HOSPITAL_COMMUNITY): Payer: Self-pay

## 2022-09-13 MED ORDER — CARVEDILOL 3.125 MG PO TABS: 3.1250 mg | ORAL_TABLET | Freq: Two times a day (BID) | ORAL | 1 refills | Status: DC

## 2022-09-17 ENCOUNTER — Other Ambulatory Visit (HOSPITAL_COMMUNITY): Payer: Self-pay

## 2022-09-27 ENCOUNTER — Telehealth: Payer: Self-pay | Admitting: Internal Medicine

## 2022-09-27 NOTE — Telephone Encounter (Signed)
Patient would like a echo done but there is not order in for it. Please advise

## 2022-09-27 NOTE — Telephone Encounter (Signed)
Called left message on patient voicemail.   Patient has not been seen in the office since 10/21/ 2022. Left message for patient to call office to set up an appointment with Dr Debara Pickett or PA or NP for evaluation.   At present time, can not order ECHO without an assessment of her symptoms.

## 2022-09-30 ENCOUNTER — Encounter: Payer: Self-pay | Admitting: Family

## 2022-09-30 ENCOUNTER — Ambulatory Visit: Payer: Managed Care, Other (non HMO) | Admitting: Family

## 2022-09-30 ENCOUNTER — Telehealth: Payer: Self-pay | Admitting: Family

## 2022-09-30 VITALS — BP 126/86 | HR 67 | Temp 97.6°F | Ht 62.0 in | Wt 165.0 lb

## 2022-09-30 DIAGNOSIS — I10 Essential (primary) hypertension: Secondary | ICD-10-CM

## 2022-09-30 DIAGNOSIS — R2242 Localized swelling, mass and lump, left lower limb: Secondary | ICD-10-CM | POA: Diagnosis not present

## 2022-09-30 DIAGNOSIS — K760 Fatty (change of) liver, not elsewhere classified: Secondary | ICD-10-CM

## 2022-09-30 DIAGNOSIS — K219 Gastro-esophageal reflux disease without esophagitis: Secondary | ICD-10-CM

## 2022-09-30 DIAGNOSIS — R600 Localized edema: Secondary | ICD-10-CM

## 2022-09-30 DIAGNOSIS — E559 Vitamin D deficiency, unspecified: Secondary | ICD-10-CM | POA: Diagnosis not present

## 2022-09-30 DIAGNOSIS — E538 Deficiency of other specified B group vitamins: Secondary | ICD-10-CM

## 2022-09-30 DIAGNOSIS — T7840XA Allergy, unspecified, initial encounter: Secondary | ICD-10-CM

## 2022-09-30 MED ORDER — PANTOPRAZOLE SODIUM 20 MG PO TBEC: 20.0000 mg | DELAYED_RELEASE_TABLET | Freq: Every day | ORAL | 0 refills | Status: DC

## 2022-09-30 NOTE — Assessment & Plan Note (Signed)
Fortunately no swelling or masses appreciated on exam today.  For further evaluation, work ordered soft tissue ultrasound to evaluate for masses. Discussed with patient conservative measures in regards to leg swelling including low-sodium diet, compression stockings and elevation with her today.

## 2022-09-30 NOTE — Progress Notes (Unsigned)
Assessment & Plan:  Mass of left lower leg Assessment & Plan: Fortunately no swelling or masses appreciated on exam today.  For further evaluation,  ordered soft tissue ultrasound, XR to evaluate for masses, bone disease. Discussed with patient conservative measures in regards to leg swelling including low-sodium diet, compression stockings and elevation with her today.  For skin lesion on left lower leg, advised dermatology consult further evaluation, biopsy if needed.  Orders: -     [place] MiscellaneoUS Localization; Future -     DG Tibia/Fibula Left; Future -     US ARTERIAL LOWER EXTREMITY DUPLEX LEFT (NON-ABI); Future  Gastroesophageal reflux disease without esophagitis -     Pantoprazole Sodium; Take 1 tablet (20 mg total) by mouth daily. Further refills from PCP, 30 minutes before food  Dispense: 90 tablet; Refill: 0  Allergy, initial encounter Assessment & Plan: Chronic,  Patient would like further evaluation including allergy testing.  Referral has been placed  Orders: -     Ambulatory referral to Allergy  Vitamin D deficiency -     VITAMIN D 25 Hydroxy (Vit-D Deficiency, Fractures)  B12 deficiency -     TSH  Fatty liver -     Lipid panel  Essential hypertension -     Comprehensive metabolic panel     Return precautions given.   Risks, benefits, and alternatives of the medications and treatment plan prescribed today were discussed, and patient expressed understanding.   Education regarding symptom management and diagnosis given to patient on AVS either electronically or printed.  No follow-ups on file.  Mable Paris, FNP  Subjective:    Patient ID: Althea Charon, female    DOB: Jul 19, 1971, 52 y.o.   MRN: [id number]  CC: NUALA CHILES is a 52 y.o. female who presents today for an acute visit.    HPI: She complains of palpable, left medial calf mass, she noticed 3  to 4 days ago. Notes a flat skin lesion above the Nontender. Not purulent drainage.  She  can still feel left calf mass she first felt 3 months ago.   She noted swelling in left foot this past week,  since resolved. Bottom of legs feel 'tight'.   No fever, sob, intermittent claudication, numbness in legs.   She works for [employer]; she drives 948 miles per day.   No injury or new exercise.   She has no h/o skin cancer.       Had previously discussed being referred for allergy testing.  She is yet to be contacted for this.  She reports extensive allergy to dust, mold.  Patient seen emergency room 07/23/2022 for knot noticed in left calf.  DVT study negative for blood clot.  Suspected lipoma per ED notes  She will establish care with Gu[facility] Minors  She follows with Dr Debara Pickett  She requests refill of Protonix.  Allergies: Codeine, Covid-19 (mrna) vaccine, Crestor [rosuvastatin], Erythromycin, and Levofloxacin Current Outpatient Medications on File Prior to Visit  Medication Sig Dispense Refill   famotidine (PEPCID) 20 MG tablet Take 1 tablet (20 mg total) by mouth 2 (two) times daily as needed for reflux 60 tablet 11   Vitamin D, Ergocalciferol, (DRISDOL) 1.25 MG (50000 UNIT) CAPS capsule Take 1 capsule (50,000 Units total) by mouth every 7 (seven) days. 13 capsule 1   carvedilol (COREG) 3.125 MG tablet Take 1 tablet (3.125 mg total) by mouth 2 (two) times daily with a meal. Please schedule appointment for future refills 30 tablet 1  clobetasol (TEMOVATE) 0.05 % external solution SMARTSIG:1 Milliliter(s) Topical Daily (Patient not taking: Reported on 09/30/2022)     Cyanocobalamin (B-12) 2000 MCG TABS Take 1 tablet by mouth daily. (Patient not taking: Reported on 09/30/2022) 90 tablet 2   hydroxychloroquine (PLAQUENIL) 200 MG tablet Take 200 mg by mouth 2 (two) times daily. (Patient not taking: Reported on 09/30/2022)     neomycin-polymyxin b-dexamethasone (MAXITROL) 3.5-10000-0.1 SUSP Apply 3 times a day to affected areas for 7 days then discontinue (Patient not taking: Reported  on 09/30/2022) 5 mL 0   Pitavastatin Calcium 2 MG TABS Take 1 tablet (2 mg total) by mouth daily. (Patient not taking: Reported on 09/30/2022) 90 tablet 3   prednisoLONE acetate (PRED FORTE) 1 % ophthalmic suspension Place 1 drop into both eyes once daily for 5 days, then place 1 drop into both eyes 2 times daily for 5 days. (Patient not taking: Reported on 09/30/2022) 5 mL 0   No current facility-administered medications on file prior to visit.    Review of Systems  Constitutional:  Negative for chills and fever.  Respiratory:  Negative for cough and shortness of breath.   Cardiovascular:  Positive for leg swelling. Negative for chest pain and palpitations.  Gastrointestinal:  Negative for nausea and vomiting.  Skin:  Positive for rash.  Neurological:  Negative for numbness.      Objective:    BP 126/86   Pulse 67   Temp 97.6 F (36.4 C) (Oral)   Ht '5\' 2"'$  (1.575 m)   Wt 165 lb (74.8 kg)   LMP 08/02/2018 (Exact Date)   SpO2 99%   BMI 30.18 kg/m   BP Readings from Last 3 Encounters:  09/30/22 126/86  07/31/22 (!) 155/93  09/13/21 122/76   Wt Readings from Last 3 Encounters:  09/30/22 165 lb (74.8 kg)  07/31/22 171 lb 1.2 oz (77.6 kg)  09/13/21 171 lb (77.6 kg)    Physical Exam Vitals reviewed.  Constitutional:      Appearance: She is well-developed.  Eyes:     Conjunctiva/sclera: Conjunctivae normal.  Cardiovascular:     Rate and Rhythm: Normal rate and regular rhythm.     Pulses: Normal pulses.     Heart sounds: Normal heart sounds.  Pulmonary:     Effort: Pulmonary effort is normal.     Breath sounds: Normal breath sounds. No wheezing, rhonchi or rales.  Musculoskeletal:     Right lower leg: No swelling, tenderness or bony tenderness.     Left lower leg: No swelling, tenderness or bony tenderness.       Legs:     Comments: Left medial calf marked on diagram where patient has appreciated mass.  Unable to palpate myself during exam.  I did note a hyperpigmented  macule approximately 1 to 2 cm left medial calf.    Skin:    General: Skin is warm and dry.  Neurological:     Mental Status: She is alert.  Psychiatric:        Speech: Speech normal.        Behavior: Behavior normal.        Thought Content: Thought content normal.

## 2022-09-30 NOTE — Telephone Encounter (Signed)
Call pt  I want to mention one more thing.  I have also placed a referral to dermatology for annual skin exam due to red lesion on her calf.  Please let her know to expect a phone call. She can also call either of the below to schedule a new patient visit  Please call and make an appointment to be evaluated.  Options in [place]  include: [facility] [phone])  or [facility] [phone])

## 2022-09-30 NOTE — Patient Instructions (Signed)
For left leg, I have ordered an x-ray and also an ultrasound to further evaluate for masses which you have felt.    you may have the x-ray performed at [facility] medical mall.  You do not need appointment and can simply walk-in to [facility].   For ultrasound, we will call you to schedule this.  If you have received a phone call in the next 10 days, please let me know.  For leg swelling, I would advise a low-sodium diet.  You may purchase over-the-counter compression stockings which go over your knee and wear those during the day.  At bedtime, you can elevate your legs above a pillow.   I have also placed a referral for allergy testing.  Our office will call you as well to schedule

## 2022-09-30 NOTE — Telephone Encounter (Signed)
Spoke to pt and informed her that Joycelyn Schmid also placed a referral to dermatology for annual skin exam due to red lesion on her calf.   Please let her know to expect a phone call. She can also call either of the below to schedule a new patient visit   Please call and make an appointment to be evaluated.  Options in [place]  include: [facility] [phone])  or [facility] [phone])

## 2022-10-02 NOTE — Assessment & Plan Note (Signed)
Chronic,  Patient would like further evaluation including allergy testing.  Referral has been placed

## 2022-10-03 ENCOUNTER — Telehealth: Payer: Self-pay | Admitting: Family

## 2022-10-03 ENCOUNTER — Telehealth: Payer: Self-pay | Admitting: Student

## 2022-10-03 NOTE — Telephone Encounter (Signed)
Lft pt vm to call ofc to sch US. thanks ?

## 2022-10-03 NOTE — Telephone Encounter (Signed)
Telephone encounter   Patient called because she was COVID+ and is having soreness in back/ribs/foot all day. Otherwise denies SOB, chest pain, fevers, dyspnea on exertion. She is calling to ask if she should have a Xray because of back pain.   Plan: - Advised patient that given her lack of SOB or dyspnea on exertion, would not get CXR as this is unlikely to be diagnostic. Her symptoms sound like myalgias/soreness secondary to COVID viral illness. Provided reassurance.   Loel Dubonnet MD MPH [facility]

## 2022-10-15 ENCOUNTER — Ambulatory Visit: Payer: Managed Care, Other (non HMO)

## 2022-10-15 NOTE — Telephone Encounter (Signed)
Juanda Crumble, [place] Nurse Health Advisor Direct Dial [phone]

## 2022-10-17 ENCOUNTER — Encounter: Payer: Self-pay | Admitting: Family

## 2022-10-18 ENCOUNTER — Ambulatory Visit
Admission: RE | Admit: 2022-10-18 | Discharge: 2022-10-18 | Disposition: A | Discharge status: Home / Self Care | Payer: Managed Care, Other (non HMO) | Source: Ambulatory Visit | Attending: Family | Admitting: Family

## 2022-10-18 DIAGNOSIS — R2242 Localized swelling, mass and lump, left lower limb: Secondary | ICD-10-CM

## 2022-10-21 ENCOUNTER — Telehealth: Payer: Self-pay | Admitting: Internal Medicine

## 2022-10-21 ENCOUNTER — Other Ambulatory Visit: Payer: Self-pay | Admitting: *Deleted

## 2022-10-21 DIAGNOSIS — I351 Nonrheumatic aortic (valve) insufficiency: Secondary | ICD-10-CM

## 2022-10-21 MED ORDER — CARVEDILOL 3.125 MG PO TABS: 3.1250 mg | ORAL_TABLET | Freq: Two times a day (BID) | ORAL | 1 refills | Status: DC

## 2022-10-21 NOTE — Telephone Encounter (Signed)
*  STAT* If patient is at the pharmacy, call can be transferred to refill team.   1. Which medications need to be refilled? (please list name of each medication and dose if known)   carvedilol (COREG) 3.125 MG tablet   2. Which pharmacy/location (including street and city if local pharmacy) is medication to be sent to?  [id number] [place] - [id number]   3. Do they need a 30 day or 90 day supply?  30 days  Patient stated she still has some of this medication.  Patient has appointment on 7/1.

## 2022-10-21 NOTE — Telephone Encounter (Signed)
Left message for pt to call back  °

## 2022-10-21 NOTE — Telephone Encounter (Signed)
Patient stated she will need orders for an Echo test as she is past due whch needs to be done prior to her next visit on 7/1.

## 2022-10-23 NOTE — Telephone Encounter (Signed)
Called patient, LVM to call back to discuss.  Left call back number.   

## 2022-10-27 ENCOUNTER — Other Ambulatory Visit: Payer: Self-pay | Admitting: Internal Medicine

## 2022-10-28 NOTE — Telephone Encounter (Signed)
According to the last office note the patient was to have an echo 01/2022. Spoke with pt, follow up echo scheduled.

## 2022-11-15 ENCOUNTER — Telehealth: Payer: Self-pay | Admitting: Internal Medicine

## 2022-11-15 MED ORDER — CARVEDILOL 3.125 MG PO TABS: 3.1250 mg | ORAL_TABLET | Freq: Two times a day (BID) | ORAL | 0 refills | Status: DC

## 2022-11-15 NOTE — Telephone Encounter (Signed)
Pt c/o medication issue:  1. Name of Medication: carvedilol (COREG) 3.125 MG tablet   2. How are you currently taking this medication (dosage and times per day)? 2 x daily   3. Are you having a reaction (difficulty breathing--STAT)?   4. What is your medication issue? Patient is very upset due to not being able to have this medication filled until next appt. She states that her insurance is wanting her to have 90 days and can no longer do pharmacy pick-up because of the mix-up.  Has not had this medication since yesterday.   She states if you do not get her, to please call back again right away. She goes through dead zones with service.

## 2022-11-15 NOTE — Telephone Encounter (Signed)
Discussed need for appt.  Prior to medication renewal.  Patient wanted to discuss the amount of meds she wanted.  Noted several times, I will send enough medication to last until her appt. (Through May 15).  She continues to ask for medication and the amount she wants.  I again noted what we will send in and that it will get her through to her appt.  Emphasized that she needs to come to her appt. In May.  She states she is going to try but she has to work and that it is hard (patient last seen in 2022) Stated understanding but noted importance for her health.  She then states has an echo for June prior to her original appt. Date.  She states she will call to try to have that before her May appt.  I advised if she is unable to have this done sooner, do not cancel her May appt. But that she could always have the echo after the appt. She states understanding of all information .

## 2022-11-20 ENCOUNTER — Telehealth: Payer: Self-pay

## 2022-11-20 NOTE — Telephone Encounter (Signed)
Called pt to go over meds and she told me she needed to reschedule the appt.   I rescheduled pt to 5/7 @ 9 am. Pt was informed to give [place] a call once she is back in town to see if we have any sooner appts.

## 2022-11-20 NOTE — Progress Notes (Deleted)
Tomasita Morrow, NP-C Phone: [phone]  [name] is a 52 y.o. female who presents today for transfer of care.   HYPERTENSION Disease Monitoring Home BP Monitoring *** Chest pain- ***    Dyspnea- *** Medications Compliance-  ***. Lightheadedness-  ***  Edema- *** BMET    Component Value Date/Time   NA 137 09/13/2021 1347   K 3.6 09/13/2021 1347   CL 100 09/13/2021 1347   CO2 29 09/13/2021 1347   GLUCOSE 91 09/13/2021 1347   BUN 11 09/13/2021 1347   CREATININE 0.67 09/13/2021 1347   CREATININE 0.76 04/03/2018 0825   CREATININE 0.57 12/06/2015 1108   CALCIUM 10.0 09/13/2021 1347   GFRNONAA >60 01/23/2021 1249   GFRNONAA >60 04/03/2018 0825   GFRAA >60 09/26/2018 1840   GFRAA >60 04/03/2018 0825   HYPERLIPIDEMIA Symptoms Chest pain on exertion:  ***   Leg claudication:   *** Medications: Compliance- *** Right upper quadrant pain- ***  Muscle aches- *** Lipid Panel     Component Value Date/Time   CHOL 151 12/06/2021 1034   TRIG 66 12/06/2021 1034   HDL 62 12/06/2021 1034   CHOLHDL 2.4 12/06/2021 1034   CHOLHDL 4 06/06/2021 1105   VLDL 25.6 06/06/2021 1105   LDLCALC 76 12/06/2021 1034   LABVLDL 13 12/06/2021 1034   GERD:   Reflux symptoms: ***   Abd pain: ***   Blood in stool: ***  Dysphagia: ***   EGD: ***  Medication: ***   Social History   Tobacco Use  Smoking Status Former   Types: Cigarettes   Quit date: 09/02/1990   Years since quitting: 32.2  Smokeless Tobacco Never  Tobacco Comments   smoked socially in high school     Current Outpatient Medications on File Prior to Visit  Medication Sig Dispense Refill   carvedilol (COREG) 3.125 MG tablet TAKE 1 TABLET (3.125 MG TOTAL) BY MOUTH 2 (TWO) TIMES DAILY WITH A MEAL. PLEASE KEEP APPOINTMENT FOR JULY 2024 FOR FUTURE REFILLS. PLEASE CONTACT THE OFFICE FOR FUTURE REFILLS. THANK YOU 30 tablet 1   carvedilol (COREG) 3.125 MG tablet Take 1 tablet (3.125 mg total) by mouth 2 (two) times daily. 120  tablet 0   clobetasol (TEMOVATE) 0.05 % external solution SMARTSIG:1 Milliliter(s) Topical Daily (Patient not taking: Reported on 09/30/2022)     Cyanocobalamin (B-12) 2000 MCG TABS Take 1 tablet by mouth daily. (Patient not taking: Reported on 09/30/2022) 90 tablet 2   famotidine (PEPCID) 20 MG tablet Take 1 tablet (20 mg total) by mouth 2 (two) times daily as needed for reflux 60 tablet 11   hydroxychloroquine (PLAQUENIL) 200 MG tablet Take 200 mg by mouth 2 (two) times daily. (Patient not taking: Reported on 09/30/2022)     neomycin-polymyxin b-dexamethasone (MAXITROL) 3.5-10000-0.1 SUSP Apply 3 times a day to affected areas for 7 days then discontinue (Patient not taking: Reported on 09/30/2022) 5 mL 0   pantoprazole (PROTONIX) 20 MG tablet Take 1 tablet (20 mg total) by mouth daily. Further refills from PCP, 30 minutes before food 90 tablet 0   Pitavastatin Calcium 2 MG TABS Take 1 tablet (2 mg total) by mouth daily. (Patient not taking: Reported on 09/30/2022) 90 tablet 3   prednisoLONE acetate (PRED FORTE) 1 % ophthalmic suspension Place 1 drop into both eyes once daily for 5 days, then place 1 drop into both eyes 2 times daily for 5 days. (Patient not taking: Reported on 09/30/2022) 5 mL 0   Vitamin D, Ergocalciferol, (DRISDOL)  1.25 MG (50000 UNIT) CAPS capsule Take 1 capsule (50,000 Units total) by mouth every 7 (seven) days. 13 capsule 1   No current facility-administered medications on file prior to visit.     ROS see history of present illness  Objective  Physical Exam There were no vitals filed for this visit.  BP Readings from Last 3 Encounters:  09/30/22 126/86  07/31/22 (!) 155/93  09/13/21 122/76   Wt Readings from Last 3 Encounters:  09/30/22 165 lb (74.8 kg)  07/31/22 171 lb 1.2 oz (77.6 kg)  09/13/21 171 lb (77.6 kg)    Physical Exam   Assessment/Plan: Please see individual problem list.  There are no diagnoses linked to this encounter.   Health Maintenance:  ***  No follow-ups on file.   Tomasita Morrow, NP-C [place]

## 2022-11-21 ENCOUNTER — Encounter: Payer: Managed Care, Other (non HMO) | Admitting: Nurse Practitioner

## 2022-11-28 ENCOUNTER — Other Ambulatory Visit: Payer: Self-pay | Admitting: Internal Medicine

## 2022-12-11 ENCOUNTER — Other Ambulatory Visit: Payer: Self-pay

## 2022-12-11 MED ORDER — CARVEDILOL 3.125 MG PO TABS: 3.1250 mg | ORAL_TABLET | Freq: Two times a day (BID) | ORAL | 0 refills | Status: DC

## 2022-12-18 ENCOUNTER — Other Ambulatory Visit (HOSPITAL_COMMUNITY): Payer: Self-pay

## 2022-12-18 ENCOUNTER — Encounter: Payer: Self-pay | Admitting: Nurse Practitioner

## 2022-12-21 ENCOUNTER — Other Ambulatory Visit: Payer: Self-pay | Admitting: Internal Medicine

## 2022-12-23 NOTE — Telephone Encounter (Signed)
Rx request sent to pharmacy.  

## 2023-01-01 ENCOUNTER — Ambulatory Visit: Payer: Managed Care, Other (non HMO) | Attending: Internal Medicine | Admitting: Internal Medicine

## 2023-01-01 ENCOUNTER — Encounter: Payer: Self-pay | Admitting: Internal Medicine

## 2023-01-01 VITALS — BP 132/80 | HR 65 | Ht 62.0 in | Wt 164.0 lb

## 2023-01-01 DIAGNOSIS — I351 Nonrheumatic aortic (valve) insufficiency: Secondary | ICD-10-CM

## 2023-01-01 DIAGNOSIS — I10 Essential (primary) hypertension: Secondary | ICD-10-CM | POA: Diagnosis not present

## 2023-01-01 DIAGNOSIS — T466X5A Adverse effect of antihyperlipidemic and antiarteriosclerotic drugs, initial encounter: Secondary | ICD-10-CM

## 2023-01-01 DIAGNOSIS — I779 Disorder of arteries and arterioles, unspecified: Secondary | ICD-10-CM

## 2023-01-01 DIAGNOSIS — M791 Myalgia, unspecified site: Secondary | ICD-10-CM

## 2023-01-01 DIAGNOSIS — E785 Hyperlipidemia, unspecified: Secondary | ICD-10-CM

## 2023-01-01 MED ORDER — CARVEDILOL 3.125 MG PO TABS: 3.1250 mg | ORAL_TABLET | Freq: Two times a day (BID) | ORAL | 3 refills | Status: DC

## 2023-01-01 NOTE — Progress Notes (Signed)
Office VIsit  Date:  01/01/2023   ID:  [name], DOB Jun 27, 1971, MRN [id number]  Patient Location:  [address] [place] [address]  Provider location:   [address], [address] [place], [place] [address]  PCP:  Oneita Hurt, No  Cardiologist:  Chrystie Nose, MD Electrophysiologist:  None   Chief Complaint:  Follow-up  History of Present Illness:    [name] is a 52 y.o. female who presents via audio/video conferencing for a telehealth visit today.  [name] is seen today in follow-up. I had last seen her in 2019 for leg swelling - recently she had chest pain - more upper back and shoulder blade, constant, worse with deep breathing. She was seen in the ER and had a negative CT for PE. Subsequently, she saw Azalee Course, PA-C in the office. He ordered a CT coronary angiogram which was performed on 11/17/2018 - this demonstrated 0 (zero) coronary calcium, however, there was a small amount of non-calcified plaque in the LAD (10-20%) - aggressive risk factor modification was recommended. She had recent labs, including a lipid profile which showed an LDL of 223, HDL of 68 and trigs of 140. I reviewed these results with her today and my recommendations for aggressive dietary intervention and possible lipid lowering therapy to target LDL of <70.  05/12/2020  [name] returns today for follow-up.  This is a virtual visit.  She actually came to the office thinking she had an office appointment.  We did perform a telephone visit.  This is follow-up because she was noted to have isolated elevation in her bilirubin which was 1.5.  Unfortunately this was not fractionated.  Her PCP advised her to discontinue her statin.  Her liver enzymes were normal.  Subsequently a repeat metabolic profile with a direct bilirubin were performed showing a normal direct bilirubin of 0.1 and again normal liver enzymes 3 months later.  Prior to that she had excellent improvement in her lipids close to her  target.  She otherwise did seem to tolerate her rosuvastatin and again had no elevation in liver enzymes.  She also had had Covid this past year.  During this time when her bilirubin was elevated she reported some abdominal bloating, changes in her urine and some intermittent right upper quadrant tenderness suggestive of possible biliary cholestasis.  06/22/2021  [name] is seen today in follow-up.  Recently labs show an increase in her cholesterol which is significant almost double of what it had been previously.  Total cholesterol 251, HDL 66, triglycerides 128 and LDL 159.  She has discontinued her rosuvastatin which she said caused her abdominal bloating and elevated liver enzymes.  Recent ALT in July however was normal at 18.  She also had increase in bilirubin however continue work-up of that seems to not be very revealing.  We also discussed the fact that she would benefit from lipid-lowering as she had a CT coronary angiogram which showed a small amount of noncalcified plaque in the LAD.  I would favor considering an alternative statin.  01/01/2023  [name] is seen today for follow-up.  She has not had repeat labs.  I had started her on Livalo, however she said she had trouble tolerating this well.  She also has some concerns today about her carvedilol.  She says she was reading about side effects and noticed over the past couple summer she has been less tolerant of the heat in the summertime feels like this  might be a side effect of the beta-blocker.  She also is inquiring about follow-up carotid Dopplers.  She has studies in 2021 which showed minimal bilateral carotid disease.  She also had CT coronary angiography which showed no coronary calcium and had trivial amount of LAD plaque.   Prior CV studies:   The following studies were reviewed today:  CT coronary angiogram Recent lipid profile  PMHx:  Past Medical History:  Diagnosis Date   Allergy    Anemia    past hx - corrected after  hysterectomy    Anxiety    past hx    Aortic insufficiency    ASCUS favoring benign 12/2015   negative high risk HPV   Chicken pox    COVID-19 december2020, 09-13-2020   GERD (gastroesophageal reflux disease)    during pregnancy    Headache    frequent 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 Antonietta Barcelona neurology 07/10/21 no emg abnormality legs rec B12 +RMSF serology   LGSIL (low grade squamous intraepithelial dysplasia)    2004-2006   Lichen planopilaris    scalp   OSA (obstructive sleep apnea)    Parapelvic renal cyst    Reflux    Sleep apnea     Past Surgical History:  Procedure Laterality Date   COLPOSCOPY     LAPAROSCOPIC VAGINAL HYSTERECTOMY WITH SALPINGO OOPHORECTOMY Bilateral 08/10/2018   Procedure: LAPAROSCOPIC ASSISTED VAGINAL HYSTERECTOMY WITH BILATERAL SALPINGO OOPHORECTOMY;  Surgeon: Dara Lords, MD;  Location: [facility];  Service: Gynecology;  Laterality: Bilateral;   TONSILLECTOMY AND ADENOIDECTOMY      FAMHx:  Family History  Problem Relation Age of Onset   Hypertension Mother    Cancer Mother        LUNG CANCER/SMOKER   Depression Mother    Mental illness Mother    Lung cancer Mother    Liver cancer Mother    Diabetes Father    Hypertension Father    Heart disease Father    Hyperlipidemia Father    Stroke Father    Hypertension Brother    Diabetes Brother    Depression Brother    Hyperlipidemia Brother    Cancer Maternal Uncle        ESOPHAGEAL CA   Esophageal cancer Maternal Uncle    Diabetes Cousin    Heart disease Cousin    Cancer Maternal Grandfather        Brain   Brain cancer Maternal Grandfather    Stroke Paternal Grandmother    Depression Maternal Grandmother    Mental retardation Maternal Grandmother    Stroke Paternal Grandfather    Colon cancer Neg Hx    Colon polyps Neg Hx    Rectal cancer Neg Hx    Stomach cancer Neg Hx      SOCHx:   reports that she quit smoking about 32 years ago. Her smoking use included cigarettes. She has never used smokeless tobacco. She reports that she does not currently use alcohol. She reports that she does not use drugs.  ALLERGIES:  Allergies  Allergen Reactions   Codeine Shortness Of Breath    Breathing problems  - liquid narcotic pain med with codeine    Covid-19 (Mrna) Vaccine Other (See Comments)    Numbness, pins and needles sensation in the hands and feet    Crestor [Rosuvastatin]  Abdominal bloating elevated lfts     Erythromycin     GI upset    Levofloxacin     Heart racing     MEDS:  Current Meds  Medication Sig   carvedilol (COREG) 3.125 MG tablet TAKE 1 TABLET (3.125 MG TOTAL) BY MOUTH 2 (TWO) TIMES DAILY WITH A MEAL. PLEASE KEEP APPOINTMENT FOR JULY 2024 FOR FUTURE REFILLS. PLEASE CONTACT THE OFFICE FOR FUTURE REFILLS. THANK YOU   pantoprazole (PROTONIX) 20 MG tablet Take 1 tablet (20 mg total) by mouth daily. Further refills from PCP, 30 minutes before food     ROS: Pertinent items noted in HPI and remainder of comprehensive ROS otherwise negative.  Labs/Other Tests and Data Reviewed:    Recent Labs: No results found for requested labs within last 365 days.   Recent Lipid Panel Lab Results  Component Value Date/Time   CHOL 151 12/06/2021 10:34 AM   TRIG 66 12/06/2021 10:34 AM   HDL 62 12/06/2021 10:34 AM   CHOLHDL 2.4 12/06/2021 10:34 AM   CHOLHDL 4 06/06/2021 11:05 AM   LDLCALC 76 12/06/2021 10:34 AM    Wt Readings from Last 3 Encounters:  01/01/23 164 lb (74.4 kg)  09/30/22 165 lb (74.8 kg)  07/31/22 171 lb 1.2 oz (77.6 kg)     Exam:    Vital Signs:  BP 132/80 (BP Location: Left Arm, Patient Position: Sitting, Cuff Size: Normal)   Pulse 65   Ht 5\' 2"  (1.575 m)   Wt 164 lb (74.4 kg)   LMP 08/02/2018 (Exact Date)   SpO2 98%   BMI 30.00 kg/m    General appearance: alert and no distress Neck: no carotid bruit, no JVD, and  thyroid not enlarged, symmetric, no tenderness/mass/nodules Lungs: clear to auscultation bilaterally Heart: regular rate and rhythm, S1, S2 normal, no murmur, click, rub or gallop Abdomen: soft, non-tender; bowel sounds normal; no masses,  no organomegaly Extremities: extremities normal, atraumatic, no cyanosis or edema Pulses: 2+ and symmetric Skin: Skin color, texture, turgor normal. No rashes or lesions Neurologic: Grossly normal Psych: Pleasant, mildly anxious  ASSESSMENT & PLAN:    Aortic insufficiency Minimal bilateral carotid artery stenosis Elevated bilirubin-?  Biliary cholestasis Pleuritic chest and upper back pain Low risk cardiac CT with CAC of 0, mild non-calcified plaque of the LAD Dyslipidemia Hypertension  Ms. Tadros is due for repeat lipids as well as a check of vitamin D and other labs.  They have been ordered recently by her PCP.  She is not taking her statin.  She will likely need to consider an additional therapy, possibly Nexletol or an alternative to statins.  I am not sure she will qualify for PCSK9 inhibitor therapy given the lack of coronary calcium and minimal ASCVD.  She has a repeat echo scheduled this summer to follow-up on her aortic insufficiency.  She would like a recheck of her carotid Dopplers which showed very minimal bilateral stenosis.  Follow-up with me annually or sooner as necessary.  Patient Risk:   After full review of this patients clinical status, I feel that they are at least moderate risk at this time.  Time:   Today, I have spent 25 minutes with the patient with telehealth technology discussing her CT results, medications, cholesterol profile, healthy diet and exercise changes, aspirin use, COVID-19 preventative measures     Medication Adjustments/Labs and Tests Ordered: Current medicines are reviewed at length with the patient today.  Concerns regarding medicines are outlined above.   Tests Ordered:  No orders of the defined types were  placed in this encounter.   Medication Changes: No orders of the defined types were placed in this encounter.   Disposition:  in 1 year(s)   Chrystie Nose, MD, [facility], FACP  [place]  [facility]  Medical Director of the Advanced Lipid Disorders &  Cardiovascular Risk Reduction Clinic Diplomate of the American Board of Clinical Lipidology Attending Cardiologist  Direct Dial: [phone]  Fax: [phone]  Website:  [email or web address]  Chrystie Nose, MD  01/01/2023 12:08 PM

## 2023-01-01 NOTE — Patient Instructions (Signed)
Medication Instructions:  NO CHANGES  *If you need a refill on your cardiac medications before your next appointment, please call your pharmacy*   Lab Work: FASTING lab work -- Rennie Plowman NP ordered lipid panel, TSH, CMET, vit D ** [facility]  If you have labs (blood work) drawn today and your tests are completely normal, you will receive your results only by: MyChart Message (if you have MyChart) OR A paper copy in the mail If you have any lab test that is abnormal or we need to change your treatment, we will call you to review the results.   Testing/Procedures: Echocardiogram as scheduled  Carotid Doppler   Follow-Up: At [facility], you and your health needs are our priority.  As part of our continuing mission to provide you with exceptional heart care, we have created designated Provider Care Teams.  These Care Teams include your primary Cardiologist (physician) and Advanced Practice Providers (APPs -  Physician Assistants and Nurse Practitioners) who all work together to provide you with the care you need, when you need it.  We recommend signing up for the patient portal called "MyChart".  Sign up information is provided on this After Visit Summary.  MyChart is used to connect with patients for Virtual Visits (Telemedicine).  Patients are able to view lab/test results, encounter notes, upcoming appointments, etc.  Non-urgent messages can be sent to your provider as well.   To learn more about what you can do with MyChart, go to [email or web address].    Your next appointment:    12 months with Dr. Rennis Golden

## 2023-01-07 ENCOUNTER — Encounter: Payer: Managed Care, Other (non HMO) | Admitting: Nurse Practitioner

## 2023-01-07 NOTE — Progress Notes (Deleted)
Bethanie Dicker, NP-C Phone: [phone]  [name] is a 52 y.o. female who presents today for transfer of care.   HYPERTENSION Disease Monitoring Home BP Monitoring *** Chest pain- ***    Dyspnea- *** Medications Compliance-  ***. Lightheadedness-  ***  Edema- *** BMET    Component Value Date/Time   NA 137 09/13/2021 1347   K 3.6 09/13/2021 1347   CL 100 09/13/2021 1347   CO2 29 09/13/2021 1347   GLUCOSE 91 09/13/2021 1347   BUN 11 09/13/2021 1347   CREATININE 0.67 09/13/2021 1347   CREATININE 0.76 04/03/2018 0825   CREATININE 0.57 12/06/2015 1108   CALCIUM 10.0 09/13/2021 1347   GFRNONAA >60 01/23/2021 1249   GFRNONAA >60 04/03/2018 0825   GFRAA >60 09/26/2018 1840   GFRAA >60 04/03/2018 0825   HYPERLIPIDEMIA Symptoms Chest pain on exertion:  ***   Leg claudication:   *** Medications: Compliance- *** Right upper quadrant pain- ***  Muscle aches- *** Lipid Panel     Component Value Date/Time   CHOL 151 12/06/2021 1034   TRIG 66 12/06/2021 1034   HDL 62 12/06/2021 1034   CHOLHDL 2.4 12/06/2021 1034   CHOLHDL 4 06/06/2021 1105   VLDL 25.6 06/06/2021 1105   LDLCALC 76 12/06/2021 1034   LABVLDL 13 12/06/2021 1034     Social History   Tobacco Use  Smoking Status Former   Types: Cigarettes   Quit date: 09/02/1990   Years since quitting: 32.3  Smokeless Tobacco Never  Tobacco Comments   smoked socially in high school     Current Outpatient Medications on File Prior to Visit  Medication Sig Dispense Refill   carvedilol (COREG) 3.125 MG tablet Take 1 tablet (3.125 mg total) by mouth 2 (two) times daily with a meal. 180 tablet 3   clobetasol (TEMOVATE) 0.05 % external solution SMARTSIG:1 Milliliter(s) Topical Daily (Patient not taking: Reported on 09/30/2022)     Cyanocobalamin (B-12) 2000 MCG TABS Take 1 tablet by mouth daily. (Patient not taking: Reported on 09/30/2022) 90 tablet 2   famotidine (PEPCID) 20 MG tablet Take 1 tablet (20 mg total) by mouth 2  (two) times daily as needed for reflux (Patient not taking: Reported on 01/01/2023) 60 tablet 11   hydroxychloroquine (PLAQUENIL) 200 MG tablet Take 200 mg by mouth 2 (two) times daily. (Patient not taking: Reported on 09/30/2022)     neomycin-polymyxin b-dexamethasone (MAXITROL) 3.5-10000-0.1 SUSP Apply 3 times a day to affected areas for 7 days then discontinue (Patient not taking: Reported on 09/30/2022) 5 mL 0   pantoprazole (PROTONIX) 20 MG tablet Take 1 tablet (20 mg total) by mouth daily. Further refills from PCP, 30 minutes before food 90 tablet 0   prednisoLONE acetate (PRED FORTE) 1 % ophthalmic suspension Place 1 drop into both eyes once daily for 5 days, then place 1 drop into both eyes 2 times daily for 5 days. (Patient not taking: Reported on 09/30/2022) 5 mL 0   Vitamin D, Ergocalciferol, (DRISDOL) 1.25 MG (50000 UNIT) CAPS capsule Take 1 capsule (50,000 Units total) by mouth every 7 (seven) days. (Patient not taking: Reported on 01/01/2023) 13 capsule 1   No current facility-administered medications on file prior to visit.     ROS see history of present illness  Objective  Physical Exam There were no vitals filed for this visit.  BP Readings from Last 3 Encounters:  01/01/23 132/80  09/30/22 126/86  07/31/22 (!) 155/93   Wt Readings from Last 3 Encounters:  01/01/23 164 lb (74.4 kg)  09/30/22 165 lb (74.8 kg)  07/31/22 171 lb 1.2 oz (77.6 kg)    Physical Exam   Assessment/Plan: Please see individual problem list.  There are no diagnoses linked to this encounter.   Health Maintenance: ***  No follow-ups on file.   Bethanie Dicker, NP-C [facility] [employer]

## 2023-01-09 ENCOUNTER — Ambulatory Visit: Payer: Managed Care, Other (non HMO) | Admitting: Nurse Practitioner

## 2023-01-09 ENCOUNTER — Encounter: Payer: Self-pay | Admitting: Nurse Practitioner

## 2023-01-09 ENCOUNTER — Other Ambulatory Visit: Payer: Self-pay | Admitting: Nurse Practitioner

## 2023-01-09 VITALS — BP 120/82 | HR 66 | Temp 97.7°F | Ht 62.0 in | Wt 161.0 lb

## 2023-01-09 DIAGNOSIS — Z1329 Encounter for screening for other suspected endocrine disorder: Secondary | ICD-10-CM

## 2023-01-09 DIAGNOSIS — E785 Hyperlipidemia, unspecified: Secondary | ICD-10-CM | POA: Diagnosis not present

## 2023-01-09 DIAGNOSIS — K219 Gastro-esophageal reflux disease without esophagitis: Secondary | ICD-10-CM

## 2023-01-09 DIAGNOSIS — E559 Vitamin D deficiency, unspecified: Secondary | ICD-10-CM | POA: Diagnosis not present

## 2023-01-09 DIAGNOSIS — I10 Essential (primary) hypertension: Secondary | ICD-10-CM

## 2023-01-09 DIAGNOSIS — E538 Deficiency of other specified B group vitamins: Secondary | ICD-10-CM

## 2023-01-09 DIAGNOSIS — D508 Other iron deficiency anemias: Secondary | ICD-10-CM

## 2023-01-09 DIAGNOSIS — R1031 Right lower quadrant pain: Secondary | ICD-10-CM

## 2023-01-09 DIAGNOSIS — R3129 Other microscopic hematuria: Secondary | ICD-10-CM

## 2023-01-09 DIAGNOSIS — Z1211 Encounter for screening for malignant neoplasm of colon: Secondary | ICD-10-CM

## 2023-01-09 DIAGNOSIS — R319 Hematuria, unspecified: Secondary | ICD-10-CM

## 2023-01-09 DIAGNOSIS — Z1231 Encounter for screening mammogram for malignant neoplasm of breast: Secondary | ICD-10-CM

## 2023-01-09 LAB — POC URINALSYSI DIPSTICK (AUTOMATED)
Bilirubin, UA: NEGATIVE
Glucose, UA: NEGATIVE
Ketones, UA: NEGATIVE
Leukocytes, UA: NEGATIVE
Nitrite, UA: NEGATIVE
Protein, UA: NEGATIVE
Spec Grav, UA: 1.015 (ref 1.010–1.025)
Urobilinogen, UA: 0.2 E.U./dL
pH, UA: 5.5 (ref 5.0–8.0)

## 2023-01-09 MED ORDER — PANTOPRAZOLE SODIUM 20 MG PO TBEC: 20.0000 mg | DELAYED_RELEASE_TABLET | Freq: Every day | ORAL | 3 refills | Status: DC

## 2023-01-09 NOTE — Assessment & Plan Note (Addendum)
No longer taking B12 supplement. Will check B12 level today.

## 2023-01-09 NOTE — Assessment & Plan Note (Addendum)
Chronic. Significant symptom improvement since starting on Protonix 20 mg daily. Continue. Refills sent. Encouraged patient to monitor diet for triggers such as avoiding spicy and acidic foods. Dietary information provided. Patient interested in Endoscopy, will refer to GI to have done with Colonoscopy.

## 2023-01-09 NOTE — Assessment & Plan Note (Addendum)
Patient unable to void while in office. She will return to check a UA. Pain is intermittent but occurring more frequently over the last several weeks. Hx of total hysterectomy in 2019. Will follow up with patient after UA results.

## 2023-01-09 NOTE — Progress Notes (Signed)
Renee Dicker, NP-C Phone: [phone]  [name] is a 52 y.o. female who presents today for transfer of care. Patient reports recently having right sided lower abdominal pain that radiates to her back. She has a history of incontinence since her hysterectomy but has not seen Urology.   UTI:  Dysuria- No  Frequency- Yes   Urgency- Yes   Hematuria- No   Fever- No  Abd pain- Yes   Vaginal d/c- No  HYPERTENSION Disease Monitoring Home BP Monitoring- Not checking Chest pain- No    Dyspnea- No Medications Compliance-  Carvedilol. Lightheadedness-  No  Edema- No BMET    Component Value Date/Time   NA 137 09/13/2021 1347   K 3.6 09/13/2021 1347   CL 100 09/13/2021 1347   CO2 29 09/13/2021 1347   GLUCOSE 91 09/13/2021 1347   BUN 11 09/13/2021 1347   CREATININE 0.67 09/13/2021 1347   CREATININE 0.76 04/03/2018 0825   CREATININE 0.57 12/06/2015 1108   CALCIUM 10.0 09/13/2021 1347   GFRNONAA >60 01/23/2021 1249   GFRNONAA >60 04/03/2018 0825   GFRAA >60 09/26/2018 1840   GFRAA >60 04/03/2018 0825   HYPERLIPIDEMIA Symptoms Chest pain on exertion:  No   Leg claudication:   No Medications: Compliance- None Right upper quadrant pain- No  Muscle aches- No Lipid Panel     Component Value Date/Time   CHOL 151 12/06/2021 1034   TRIG 66 12/06/2021 1034   HDL 62 12/06/2021 1034   CHOLHDL 2.4 12/06/2021 1034   CHOLHDL 4 06/06/2021 1105   VLDL 25.6 06/06/2021 1105   LDLCALC 76 12/06/2021 1034   LABVLDL 13 12/06/2021 1034   GERD:   Reflux symptoms: Greatly improved since starting on medication- occasional bitter taste in mouth   Abd pain: No   Blood in stool: No  Dysphagia: No   EGD: Never  Medication: Protonix   Social History   Tobacco Use  Smoking Status Former   Types: Cigarettes   Quit date: 09/02/1990   Years since quitting: 32.3  Smokeless Tobacco Never  Tobacco Comments   smoked socially in high school     Current Outpatient Medications on File Prior to  Visit  Medication Sig Dispense Refill   carvedilol (COREG) 3.125 MG tablet Take 1 tablet (3.125 mg total) by mouth 2 (two) times daily with a meal. 180 tablet 3   No current facility-administered medications on file prior to visit.     ROS see history of present illness  Objective  Physical Exam Vitals:   01/09/23 1120  BP: 120/82  Pulse: 66  Temp: 97.7 F (36.5 C)  SpO2: 99%    BP Readings from Last 3 Encounters:  01/09/23 120/82  01/01/23 132/80  09/30/22 126/86   Wt Readings from Last 3 Encounters:  01/09/23 161 lb (73 kg)  01/01/23 164 lb (74.4 kg)  09/30/22 165 lb (74.8 kg)    Physical Exam Constitutional:      General: She is not in acute distress.    Appearance: Normal appearance.  HENT:     Head: Normocephalic.  Cardiovascular:     Rate and Rhythm: Normal rate and regular rhythm.     Heart sounds: Normal heart sounds.  Pulmonary:     Effort: Pulmonary effort is normal.     Breath sounds: Normal breath sounds.  Abdominal:     General: Abdomen is flat. Bowel sounds are normal.     Palpations: Abdomen is soft.     Tenderness: There is  abdominal tenderness (mild- RLQ). There is right CVA tenderness.  Skin:    General: Skin is warm and dry.  Neurological:     General: No focal deficit present.     Mental Status: She is alert.  Psychiatric:        Mood and Affect: Mood normal.        Behavior: Behavior normal.    Assessment/Plan: Please see individual problem list.  Right lower quadrant abdominal pain Assessment & Plan: Patient unable to void while in office. She will return to check a UA. Pain is intermittent but occurring more frequently over the last several weeks. Hx of total hysterectomy in 2019. Will follow up with patient after UA results.   Orders: -     POCT Urinalysis Dipstick (Automated); Future  Essential hypertension Assessment & Plan: Chronic. Stable on Carvedilol. Continue. Lab work as outlined. Follow up with Cardiology.    Orders: -     CBC with Differential/Platelet -     Comprehensive metabolic panel  Hyperlipidemia, unspecified hyperlipidemia type Assessment & Plan: Chronic. Not on any medications currently. Intolerance to statins. Will check lipid panel today and start patient on statin alternative if necessary. Encouraged healthy diet and exercise.  Orders: -     Lipid panel  Gastroesophageal reflux disease, unspecified whether esophagitis present Assessment & Plan: Chronic. Significant symptom improvement since starting on Protonix 20 mg daily. Continue. Refills sent. Encouraged patient to monitor diet for triggers such as avoiding spicy and acidic foods. Dietary information provided. Patient interested in Endoscopy, will refer to GI to have done with Colonoscopy.   Orders: -     Ambulatory referral to Gastroenterology -     Pantoprazole Sodium; Take 1 tablet (20 mg total) by mouth daily.  Dispense: 90 tablet; Refill: 3  Vitamin D deficiency Assessment & Plan: No longer taking Vitamin D supplement. Will check Vitamin D level today.   Orders: -     VITAMIN D 25 Hydroxy (Vit-D Deficiency, Fractures)  B12 deficiency Assessment & Plan: No longer taking B12 supplement. Will check B12 level today.   Orders: -     Vitamin B12  Other iron deficiency anemia Assessment & Plan: No longer taking iron supplement. Will check iron panel today.   Orders: -     Iron, TIBC and Ferritin Panel  Screening mammogram for breast cancer -     3D Screening Mammogram, Left and Right; Future  Screen for colon cancer -     Ambulatory referral to Gastroenterology  Thyroid disorder screen -     TSH    Return in about 6 months (around 07/12/2023) for Follow up.   Renee Dicker, NP-C [facility] - [employer]

## 2023-01-09 NOTE — Assessment & Plan Note (Signed)
Chronic. Stable on Carvedilol. Continue. Lab work as outlined. Follow up with Cardiology.

## 2023-01-09 NOTE — Assessment & Plan Note (Addendum)
Chronic. Not on any medications currently. Intolerance to statins. Will check lipid panel today and start patient on statin alternative if necessary. Encouraged healthy diet and exercise.

## 2023-01-09 NOTE — Assessment & Plan Note (Signed)
No longer taking Vitamin D supplement. Will check Vitamin D level today.

## 2023-01-09 NOTE — Assessment & Plan Note (Signed)
No longer taking iron supplement. Will check iron panel today.

## 2023-01-09 NOTE — Addendum Note (Signed)
Addended by: Warden Fillers on: 01/09/2023 02:07 PM   Modules accepted: Orders

## 2023-01-10 LAB — CBC WITH DIFFERENTIAL/PLATELET
Basophils Absolute: 0 10*3/uL (ref 0.0–0.2)
Basos: 1 %
EOS (ABSOLUTE): 0 10*3/uL (ref 0.0–0.4)
Eos: 1 %
Hematocrit: 42 % (ref 34.0–46.6)
Hemoglobin: 14 g/dL (ref 11.1–15.9)
Immature Grans (Abs): 0 10*3/uL (ref 0.0–0.1)
Immature Granulocytes: 0 %
Lymphocytes Absolute: 1.7 10*3/uL (ref 0.7–3.1)
Lymphs: 42 %
MCH: 30 pg (ref 26.6–33.0)
MCHC: 33.3 g/dL (ref 31.5–35.7)
MCV: 90 fL (ref 79–97)
Monocytes Absolute: 0.3 10*3/uL (ref 0.1–0.9)
Monocytes: 7 %
Neutrophils Absolute: 2.1 10*3/uL (ref 1.4–7.0)
Neutrophils: 49 %
Platelets: 310 10*3/uL (ref 150–450)
RBC: 4.66 x10E6/uL (ref 3.77–5.28)
RDW: 13.3 % (ref 11.7–15.4)
WBC: 4.2 10*3/uL (ref 3.4–10.8)

## 2023-01-10 LAB — LIPID PANEL
Chol/HDL Ratio: 4.1 ratio (ref 0.0–4.4)
Cholesterol, Total: 236 mg/dL — ABNORMAL HIGH (ref 100–199)
HDL: 57 mg/dL (ref >39)
LDL Chol Calc (NIH): 162 mg/dL — ABNORMAL HIGH (ref 0–99)
Triglycerides: 96 mg/dL (ref 0–149)
VLDL Cholesterol Cal: 17 mg/dL (ref 5–40)

## 2023-01-10 LAB — URINALYSIS, MICROSCOPIC ONLY
Bacteria, UA: NONE SEEN
Casts: NONE SEEN /lpf
Epithelial Cells (non renal): NONE SEEN /hpf (ref 0–10)
RBC, Urine: NONE SEEN /hpf (ref 0–2)
WBC, UA: NONE SEEN /hpf (ref 0–5)

## 2023-01-10 LAB — IRON,TIBC AND FERRITIN PANEL
Ferritin: 144 ng/mL (ref 15–150)
Iron Saturation: 21 % (ref 15–55)
Iron: 73 ug/dL (ref 27–159)
Total Iron Binding Capacity: 350 ug/dL (ref 250–450)
UIBC: 277 ug/dL (ref 131–425)

## 2023-01-10 LAB — COMPREHENSIVE METABOLIC PANEL
ALT: 12 IU/L (ref 0–32)
AST: 20 IU/L (ref 0–40)
Albumin/Globulin Ratio: 1.5 (ref 1.2–2.2)
Albumin: 4.2 g/dL (ref 3.8–4.9)
Alkaline Phosphatase: 106 IU/L (ref 44–121)
BUN/Creatinine Ratio: 19 (ref 9–23)
BUN: 14 mg/dL (ref 6–24)
Bilirubin Total: 1.1 mg/dL (ref 0.0–1.2)
CO2: 23 mmol/L (ref 20–29)
Calcium: 9.2 mg/dL (ref 8.7–10.2)
Chloride: 103 mmol/L (ref 96–106)
Creatinine, Ser: 0.73 mg/dL (ref 0.57–1.00)
Globulin, Total: 2.8 g/dL (ref 1.5–4.5)
Glucose: 94 mg/dL (ref 70–99)
Potassium: 4.3 mmol/L (ref 3.5–5.2)
Sodium: 140 mmol/L (ref 134–144)
Total Protein: 7 g/dL (ref 6.0–8.5)
eGFR: 100 mL/min/{1.73_m2} (ref >59)

## 2023-01-10 LAB — VITAMIN D 25 HYDROXY (VIT D DEFICIENCY, FRACTURES): Vit D, 25-Hydroxy: 23.8 ng/mL — ABNORMAL LOW (ref 30.0–100.0)

## 2023-01-10 LAB — VITAMIN B12: Vitamin B-12: 311 pg/mL (ref 232–1245)

## 2023-01-10 LAB — TSH: TSH: 1.95 u[IU]/mL (ref 0.450–4.500)

## 2023-01-11 LAB — URINE CULTURE

## 2023-01-14 ENCOUNTER — Ambulatory Visit (HOSPITAL_COMMUNITY): Payer: Managed Care, Other (non HMO) | Attending: Internal Medicine

## 2023-01-14 DIAGNOSIS — I083 Combined rheumatic disorders of mitral, aortic and tricuspid valves: Secondary | ICD-10-CM

## 2023-01-14 DIAGNOSIS — I351 Nonrheumatic aortic (valve) insufficiency: Secondary | ICD-10-CM | POA: Diagnosis present

## 2023-01-14 LAB — ECHOCARDIOGRAM COMPLETE
Area-P 1/2: 3.77 cm2
P 1/2 time: 420 msec
S' Lateral: 2.7 cm

## 2023-01-16 ENCOUNTER — Ambulatory Visit (HOSPITAL_COMMUNITY)
Admission: RE | Admit: 2023-01-16 | Discharge: 2023-01-16 | Disposition: A | Discharge status: Home / Self Care | Payer: Managed Care, Other (non HMO) | Source: Ambulatory Visit | Attending: Internal Medicine | Admitting: Internal Medicine

## 2023-01-16 ENCOUNTER — Telehealth: Payer: Self-pay

## 2023-01-16 ENCOUNTER — Other Ambulatory Visit: Payer: Self-pay

## 2023-01-16 DIAGNOSIS — I10 Essential (primary) hypertension: Secondary | ICD-10-CM | POA: Diagnosis not present

## 2023-01-16 DIAGNOSIS — Z87891 Personal history of nicotine dependence: Secondary | ICD-10-CM | POA: Insufficient documentation

## 2023-01-16 DIAGNOSIS — E785 Hyperlipidemia, unspecified: Secondary | ICD-10-CM | POA: Insufficient documentation

## 2023-01-16 DIAGNOSIS — R519 Headache, unspecified: Secondary | ICD-10-CM | POA: Insufficient documentation

## 2023-01-16 DIAGNOSIS — R6884 Jaw pain: Secondary | ICD-10-CM | POA: Diagnosis not present

## 2023-01-16 DIAGNOSIS — I779 Disorder of arteries and arterioles, unspecified: Secondary | ICD-10-CM | POA: Insufficient documentation

## 2023-01-16 MED ORDER — BLOOD PRESSURE CUFF MISC: 0 refills | Status: DC

## 2023-01-16 NOTE — Telephone Encounter (Signed)
Patient had appointment for dopplers today.She requested a prescription for a B/P monitor.Prescription given.Advised to purchase at one of the [facility].

## 2023-02-25 ENCOUNTER — Ambulatory Visit: Payer: Managed Care, Other (non HMO)

## 2023-02-26 ENCOUNTER — Other Ambulatory Visit (HOSPITAL_COMMUNITY): Payer: Managed Care, Other (non HMO)

## 2023-02-28 ENCOUNTER — Ambulatory Visit
Admission: RE | Admit: 2023-02-28 | Discharge: 2023-02-28 | Disposition: A | Discharge status: Home / Self Care | Payer: Managed Care, Other (non HMO) | Source: Ambulatory Visit | Attending: Nurse Practitioner | Admitting: Nurse Practitioner

## 2023-02-28 DIAGNOSIS — Z1231 Encounter for screening mammogram for malignant neoplasm of breast: Secondary | ICD-10-CM

## 2023-03-03 ENCOUNTER — Ambulatory Visit: Payer: Managed Care, Other (non HMO) | Admitting: Internal Medicine

## 2023-03-04 ENCOUNTER — Telehealth: Payer: Self-pay

## 2023-03-04 NOTE — Telephone Encounter (Signed)
Received a stat order for pt from the [facility] .  Paper was given to provider to sign. Provider signed and stat signed order was faxed to [phone] with a completed transmission log.

## 2023-05-26 ENCOUNTER — Other Ambulatory Visit: Payer: Self-pay

## 2023-05-26 ENCOUNTER — Telehealth: Payer: Self-pay

## 2023-05-26 DIAGNOSIS — Z1231 Encounter for screening mammogram for malignant neoplasm of breast: Secondary | ICD-10-CM

## 2023-05-26 NOTE — Telephone Encounter (Signed)
Called pt in regards to mammogram notification,   Pt stated she likes to go to the [facility] center of [facility] and that she went a couple months ago and they told her she needs a diagnostic mammogram order, I have not received any orders. I informed pt that provider will order to the [facility] and if they need other orders they will inform us. Pt insisted that they told her they were going to need other orders.  Pt would like diagnostic mammogram orders placed.    Pt also wants to have her Vitamin D rechecked. Pt was last seen in may and last vitamin D lab was checked in may.    Pt also wanted to know about her colonoscopy referral. Pt was informed that it looks like they tried to get in contact with her in July to schedule and mailed her a letter to call and get scheduled. Pt was given the phone number listed in the letter and was informed that if they state they can not schedule to let us a know and a new order will be sent.

## 2023-05-29 ENCOUNTER — Other Ambulatory Visit: Payer: Self-pay | Admitting: Nurse Practitioner

## 2023-05-29 DIAGNOSIS — E559 Vitamin D deficiency, unspecified: Secondary | ICD-10-CM

## 2023-05-29 DIAGNOSIS — N644 Mastodynia: Secondary | ICD-10-CM

## 2023-05-29 DIAGNOSIS — Z1231 Encounter for screening mammogram for malignant neoplasm of breast: Secondary | ICD-10-CM

## 2023-05-29 NOTE — Telephone Encounter (Signed)
Pt has been informed and scheduled for vitamin d lab on 05-30-23 @ 11 am

## 2023-05-30 ENCOUNTER — Other Ambulatory Visit: Payer: Managed Care, Other (non HMO)

## 2023-06-02 ENCOUNTER — Other Ambulatory Visit: Payer: Managed Care, Other (non HMO)

## 2023-07-01 ENCOUNTER — Other Ambulatory Visit: Payer: Self-pay | Admitting: Nurse Practitioner

## 2023-07-01 DIAGNOSIS — Z1231 Encounter for screening mammogram for malignant neoplasm of breast: Secondary | ICD-10-CM

## 2023-07-01 DIAGNOSIS — N644 Mastodynia: Secondary | ICD-10-CM

## 2023-07-25 ENCOUNTER — Other Ambulatory Visit: Payer: Managed Care, Other (non HMO)

## 2023-09-05 ENCOUNTER — Ambulatory Visit: Payer: Managed Care, Other (non HMO)

## 2023-09-05 ENCOUNTER — Ambulatory Visit
Admission: RE | Admit: 2023-09-05 | Discharge: 2023-09-05 | Disposition: A | Discharge status: Home / Self Care | Payer: Managed Care, Other (non HMO) | Source: Ambulatory Visit | Attending: Nurse Practitioner | Admitting: Nurse Practitioner

## 2023-09-05 ENCOUNTER — Ambulatory Visit
Admission: RE | Admit: 2023-09-05 | Discharge: 2023-09-05 | Disposition: A | Discharge status: Home / Self Care | Payer: Managed Care, Other (non HMO) | Source: Ambulatory Visit | Attending: Nurse Practitioner

## 2023-09-05 DIAGNOSIS — Z1231 Encounter for screening mammogram for malignant neoplasm of breast: Secondary | ICD-10-CM

## 2023-09-05 DIAGNOSIS — N644 Mastodynia: Secondary | ICD-10-CM

## 2023-09-15 ENCOUNTER — Other Ambulatory Visit: Payer: Self-pay

## 2023-09-25 ENCOUNTER — Ambulatory Visit: Payer: Managed Care, Other (non HMO) | Admitting: Dermatology

## 2023-10-06 ENCOUNTER — Ambulatory Visit: Payer: Managed Care, Other (non HMO) | Admitting: Dermatology

## 2023-11-25 ENCOUNTER — Ambulatory Visit: Payer: Managed Care, Other (non HMO) | Admitting: Dermatology

## 2023-12-19 ENCOUNTER — Other Ambulatory Visit (HOSPITAL_COMMUNITY): Payer: Self-pay

## 2023-12-19 ENCOUNTER — Telehealth: Payer: Self-pay | Admitting: Internal Medicine

## 2023-12-19 MED ORDER — BLOOD PRESSURE CUFF MISC
0 refills | Status: AC
  Filled 2023-12-19: qty 1, 28d supply, fill #0

## 2023-12-19 NOTE — Telephone Encounter (Signed)
 Pt c/o BP issue: STAT if pt c/o blurred vision, one-sided weakness or slurred speech.  STAT if BP is GREATER than 180/120 TODAY.  STAT if BP is LESS than 90/60 and SYMPTOMATIC TODAY  1. What is your BP concern? Running 140/high 80's   2. Have you taken any BP medication today? Yes  3. What are your last 5 BP readings? Has not checked recently   4. Are you having any other symptoms (ex. Dizziness, headache, blurred vision, passed out)? Slight headache at time   Patient is scheduled for a flight 05/01 to [place]  and has never flown before, so she is wanting to get BP under control prior to.   States issue has been ongoing since last visit.   Patient would also like to know when she is due for another echo as well.  Please advise.

## 2023-12-19 NOTE — Telephone Encounter (Signed)
 Spoke with pt regarding her elevated blood pressure. Pt states that she believes this has been an issue over the past year. Pt does not have a blood pressure cuff at home, however she says she checks it sporadically. Pt states that she has an occasional headache that she believes could be contributed to her blood pressure. Pt states she does not keep a log but blood pressure usually ranges 130-140s/80-90s. Pt is currently taking carvedilol  3.125md BID as prescribed. Pt is flying for the first time on 5/1 to [place]  (unexpectedly). Pt is concerned to not have blood pressure better controlled prior to travelling. Pt was last seen 01/01/2023. Pt was given a prescription for a blood pressure cuff at that time, pt states she was not able to pick this up. Pt requests new prescription be sent to [facility]. Prescription sent electronically. Advised pt to keep a blood pressure log for at least a week. Pt is requesting a follow up office visit which agree this is needed however, no openings prior to pt's trip. Will forward to provider to advise. Pt has no further questions at this time.

## 2023-12-20 ENCOUNTER — Other Ambulatory Visit (HOSPITAL_COMMUNITY): Payer: Self-pay

## 2023-12-22 ENCOUNTER — Other Ambulatory Visit (HOSPITAL_COMMUNITY): Payer: Self-pay

## 2023-12-24 ENCOUNTER — Other Ambulatory Visit (HOSPITAL_COMMUNITY): Payer: Self-pay

## 2023-12-24 NOTE — Telephone Encounter (Signed)
 Hazle Lites, MD  Deforest Fast, RN3 days ago    Needs an appt when she returns with me or APP - we can consider repeat echo at that time as well.  Dr Marvina Slough

## 2023-12-24 NOTE — Telephone Encounter (Signed)
 Left message to call back

## 2024-01-07 NOTE — Telephone Encounter (Signed)
 Left message to call back

## 2024-02-05 ENCOUNTER — Other Ambulatory Visit: Payer: Self-pay | Admitting: Nurse Practitioner

## 2024-02-05 ENCOUNTER — Other Ambulatory Visit: Payer: Self-pay | Admitting: Internal Medicine

## 2024-02-05 DIAGNOSIS — K219 Gastro-esophageal reflux disease without esophagitis: Secondary | ICD-10-CM

## 2024-02-05 NOTE — Telephone Encounter (Signed)
Does pt need appt before refill 

## 2024-02-05 NOTE — Telephone Encounter (Signed)
 Called and informed pt of reason for refusal of medication. Set appt for 03/24/24 but pt would like a call if earlier appt is available. Asking for medication to hold over until appt.

## 2024-02-06 ENCOUNTER — Other Ambulatory Visit: Payer: Self-pay | Admitting: Nurse Practitioner

## 2024-02-06 ENCOUNTER — Telehealth: Payer: Self-pay

## 2024-02-06 DIAGNOSIS — K219 Gastro-esophageal reflux disease without esophagitis: Secondary | ICD-10-CM

## 2024-02-06 MED ORDER — PANTOPRAZOLE SODIUM 20 MG PO TBEC: 20.0000 mg | DELAYED_RELEASE_TABLET | Freq: Every day | ORAL | 1 refills | Status: DC

## 2024-02-06 NOTE — Telephone Encounter (Signed)
 Copied from CRM [id number]. Topic: Clinical - Prescription Issue >> Feb 06, 2024  4:04 PM Sasha H wrote: Reason for CRM: pt called in stating the pantoprazole  (PROTONIX ) 20 MG tablet has to be sent in as a 90 day supply in order for her insurance to cover it. If she gets a 90 day supply, it will be $20.  For the 30 day supply it is over $100, pt states she can not pay that . Please reach out to the patient in regards to this.

## 2024-02-06 NOTE — Addendum Note (Signed)
 Addended by: Bluford Burkitt on: 02/06/2024 01:09 PM   Modules accepted: Orders

## 2024-02-24 ENCOUNTER — Telehealth: Payer: Self-pay | Admitting: Internal Medicine

## 2024-02-24 NOTE — Telephone Encounter (Signed)
 Left voicemail to return call to office

## 2024-02-24 NOTE — Telephone Encounter (Signed)
 Pt c/o BP issue: STAT if pt c/o blurred vision, one-sided weakness or slurred speech.   1. What is your BP concern?  BP is elevated.  2. Have you taken any BP medication today? Carvedilol   3. What are your last 5 BP readings? 6/23: 159/99          147/99  4. Are you having any other symptoms (ex. Dizziness, headache, blurred vision, passed out)?  Headache all day yesterday. Patient mentions that she was working in and out of the heat. She mentions a salty taste in her mouth. Squeezing in chest a few weeks ago, but hasn't occurred since then. Cramping in legs.

## 2024-02-24 NOTE — Telephone Encounter (Signed)
 Pt called to report that she has been having elevated BP:  6/23: 159/99          147/99 HR in the 70's.   She has had a headache.. no dizziness, no chest pain.   She has been having leg cramps so she has been drinking Alkaline water which may have sodium so she will try to drink plain water.. 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   I have asked her to keep a longer BP log for us .. I made her an appt with Dr Mona 03/16/24.   She is asking if Dr Mona can prescribe something in the meantime until she is seen.   She does not want a diuretic since she tried HCTZ in the past and had to urinate too much and difficult for her with the nature of her work.

## 2024-02-24 NOTE — Telephone Encounter (Signed)
 Spoke with pt and went over Dr. Katharina recommendations. Pt verbalized understanding of plan and had no further questions.

## 2024-02-24 NOTE — Telephone Encounter (Signed)
 Pt is returning call.

## 2024-03-16 ENCOUNTER — Ambulatory Visit: Admitting: Internal Medicine

## 2024-03-24 ENCOUNTER — Ambulatory Visit: Admitting: Nurse Practitioner

## 2024-04-13 ENCOUNTER — Other Ambulatory Visit: Payer: Self-pay | Admitting: Internal Medicine

## 2024-04-14 ENCOUNTER — Other Ambulatory Visit: Payer: Self-pay | Admitting: Internal Medicine

## 2024-04-19 NOTE — Progress Notes (Deleted)
 Cardiology Office Note:    Date:  04/19/2024   ID:  [name], DOB 1971-01-06, MRN [id number]  PCP:  Gretel App, NP  Cardiologist:  Vinie JAYSON Maxcy, MD { Click to update primary MD,subspecialty MD or APP then REFRESH:1}    Referring MD: Gretel App, NP   Chief Complaint: elevated BP  History of Present Illness:    [name] is a 53 y.o. 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 Maxcy and presents today for evaluation of elevated BP.   Coronary CTA in 11/2018 for further evaluation of chest pain showed a coronary calcium  score of 0 with minimal soft plaque (10-20%) in the LAD but no obstructive CAD. Echo in 04/2019 showed LVEF of 60-65% with grade 1 diastolic dysfunction, normal RV function, and mild to moderate AI. She has had routine Echos over the years for routine monitoring of aortic valve disease. She has also had multiple lower extremity dopplers over the years due to rule out DVT due to concerning family history which have been negative.   She was last seen by Dr. Maxcy in 01/2023 at which time she was stable from a cardiac standpoint. Repeat Echo for routine monitoring of valvular disease was ordered and showed LVEF of 55-60% with normal wall motion and diastolic function, normal RV function mild to moderate AI, and mild MR. Carotid dopplers were also ordered and showed near normal carotid arteries but some mild bead-like appearance with elevated end diastolic velocities of bilateral ICAs suggestive of FMD.   Patient presents today for follow-up. ***  Hypertension BP *** - Continue Coreg  3.125mg  twice daily.   Minimal Non-Obstructive CAD Coronary CTA in 11/2018 showed a coronary calcium  score of 0 with minimal soft plaque  (10-20%) in the LAD but no obstructive CAD. - No chest pain.  - No Aspirin  given only minimal disease.  - Previously on Crestor  but did not tolerate this. See recommendations for lipid therapy below.  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  - Will order CTA for more definitive evaluation of this. ***  Aortic Insufficiency  Mitral Regurgitation Echo in 01/2023 showed normal LV function with mild to moderate AI and mild MR.  - Will repeat Echo for routine monitoring of valvular disease. ***  Hyperlipidemia Lipid panel in 01/2023: Total Cholesterol 236, Triglycerides 96, HDL 57, LDL 162. LDL goal <70 given CAD.  - Previously on Crestor  but did not tolerate this.  - ***  EKGs/Labs/Other Studies Reviewed:    The following studies were reviewed:  Coronary CTA 11/17/2018: Impressions: 1. Coronary calcium  score of 0. This was 0 percentile for age and sex matched control. 2. Normal coronary origin with left dominance. 3. No evidence of obstructive CAD. _______________  Echocardiogram 01/14/2023: Impressions:  1. Left ventricular ejection fraction, by estimation, is 55 to 60%. The  left ventricle has normal function. The left ventricle has no regional  wall motion abnormalities. Left ventricular diastolic parameters were  normal.   2. Right ventricular systolic function is normal. The right ventricular  size is normal. There is normal pulmonary artery systolic pressure.   3. The mitral valve is abnormal. Mild mitral valve regurgitation.  No  evidence of mitral stenosis.   4. The aortic valve is tricuspid. There is mild calcification of the  aortic valve. There is mild thickening of the aortic valve. Aortic valve  regurgitation is mild to moderate. Aortic valve sclerosis is present, with  no evidence of aortic valve   stenosis.   5. The inferior vena cava is normal in size with greater than 50%  respiratory variability, suggesting right atrial pressure of 3 mmHg.  _______________  Carotid Dopplers 01/16/2023: Summary:  - Right Carotid: There is no evidence of stenosis in the right ICA. The extracranial vessels were near-normal with only minimal wall thickening or plaque. Duplex imaging of the carotid  arteries reveals smooth vessel walls, without plaque formation. Mild bead-like appearance with elevated end diastolic velocities is suggestive of fibromuscular dysplasia.  - Left Carotid: There is no evidence of stenosis in the left ICA. The extracranial vessels were near-normal with only minimal wall thickening or plaque. Duplex imaging of the carotid arteries reveals smooth vessel walls, without plaque formation. Mild bead-like appearance with elevated end diastolic velocities is suggestive of fibromuscular dysplasia.  - Vertebrals:  Bilateral vertebral arteries demonstrate antegrade flow.  - Subclavians: Normal flow hemodynamics were seen in bilateral subclavian arteries.   EKG:  EKG ordered today. EKG personally reviewed and demonstrates ***.  Recent Labs: No results found for requested labs within last 365 days.  Recent Lipid Panel    Component Value Date/Time   CHOL 236 (H) 01/09/2023 1146   TRIG 96 01/09/2023 1146   HDL 57 01/09/2023 1146   CHOLHDL 4.1 01/09/2023 1146   CHOLHDL 4 06/06/2021 1105   VLDL 25.6 06/06/2021 1105   LDLCALC 162 (H) 01/09/2023 1146    Physical Exam:    Vital Signs: LMP 08/02/2018 (Exact Date)     Wt Readings from Last 3 Encounters:  01/09/23 161 lb (73 kg)  01/01/23 164 lb (74.4 kg)  09/30/22 165 lb (74.8 kg)     General: 53 y.o. female in no acute distress. HEENT: Normocephalic and atraumatic. Sclera clear.  Neck: Supple. No carotid bruits. No JVD. Heart: *** RRR. Distinct S1 and S2. No murmurs, gallops, or rubs.  Lungs: No increased work of breathing.  Clear to ausculation bilaterally. No wheezes, rhonchi, or rales.  Abdomen: Soft, non-distended, and non-tender to palpation.  Extremities: No lower extremity edema.  Radial and distal pedal pulses 2+ and equal bilaterally. Skin: Warm and dry. Neuro: No focal deficits. Psych: Normal affect. Responds appropriately.   Assessment:    No diagnosis found.  Plan:     Disposition: Follow up in ***   Signed, Darcell Sabino E Inola Lisle, PA-C  04/19/2024 11:57 AM    [facility]

## 2024-04-26 NOTE — Progress Notes (Deleted)
 Cardiology Office Note:  .   Date:  04/26/2024  ID:  [name], DOB 08/12/1971, MRN [id number] PCP: Gretel App, NP  [facility] Providers Cardiologist:  Vinie JAYSON Maxcy, MD {  History of Present Illness: [name] is a 53 y.o. female  with PMHx of valvular heart disease (Echo 01/2024: mild MV regurgitation, mild to moderate AI), bilateral carotid artery stenosis (2021: mild, 2025: no stenosis but very mild fibromuscular dysplasia suspected), pleuritic chest/upper back pain, HLD, HTN, and CAC score of 0 with very minimal 10-20% soft plaque in mid LAD on CCTA in 2020 who reports to [facility] office for follow up.   Last seen in heartcare OV 01/01/2023 by Dr. Maxcy for telehealth visit.  Overall doing well from cardiac standpoint without any complaints.  Noted concern for side effects of carvedilol  including less heat tolerance, however, Carvedilol  remains on medication list. .  Patient also inquired about follow-up cardiac Doppler. Noted medication noncompliance including Pitavastatin  and was removed from medication list.  Discussed considering other HLD therapy including Nexletol or an alternative to statins. Noted may not qualify for PCSK9 inhibitor given lack of coronary calcium  and minimal ASCVD. No other HLD medication was started. She already had pending echo and carotid Doppler scheduled.   ECHO 01/2024 showed EF 55-60%, mild MV regurgitations, mild to moderate AI, mild AV thickening/calcification without AS.  Carotid US  01/2024 showed no stenosis but very mild fibromuscular dysplasia suspected.   Patient called 02/2024 with concerns for elevated BP.  Dr. Maxcy advised staying hydrated, decreasing sodium intake and 2-week BP log.  Today, reports ### and denies ###.  Reports compliance with medications.  Dietary habitats:  Activity level:  Social: Denies tobacco use/alcohol/drug use  Denies any recent hospitalizations or visits to the emergency department.   Essential  hypertension Reports well controlled Home BP:  BP this OV well controlled today:  Continue on Coreg  3.125 mg BID.  Encourage physical activity for 150 minutes per week and heart healthy low sodium diet. Discussed limiting sodium intake to < 2 grams daily.     Hyperlipidemia, LDL goal < 70 Coronary Calcification on CCTA  CCTA 2020: CAC score of 0 with very minimal 10-20% soft plaque in mid LAD and no evidence of obstructive CAD.  LDL 162 and LFT WNL in 01/2023 Last OV 01/2023 noted not taking Pitavastatin  and discontinued from med list.  Discussed considering other HLD therapy including Nexletol or an alternative to statins. Noted may not qualify for PCSK9 inhibitor given lack of coronary calcium  and minimal ASCVD. HLD meds??? Previously discontinue Crestor  due to abdominal bloating and elevated liver enzymes.   Valvular heart disease Aortic valve insufficiency, etiology of cardiac valve disease unspecified ECHO 01/2024 mild MV regurgitation, mild to moderate AI, mild AV thickening/calcification without AS.  Continue to monitor.  Will plan for repeat echo in 12/2024.  Mild carotid artery disease (HCC) 2021: mild bilateral stenosis   2025: no stenosis but very mild fibromuscular dysplasia suspected No carotid bruits on exam.  Continue to monitor.   ROS: 10 point review of system has been reviewed and considered negative except ones been listed in the HPI.   Studies Reviewed: [facility]ABRA   CCTA 11/2018 IMPRESSION: 1. Coronary calcium  score of 0. This was 0 percentile for age and sex matched control. 2. Normal coronary origin with left dominance. 3. No evidence of obstructive CAD. LAD is a large vessel that has minimal 10-20% soft attenuation plaque in the mid LAD.  ECHO 01/2023 IMPRESSIONS   1. Left ventricular ejection fraction, by estimation, is 55 to 60%. The  left ventricle has normal function. The left ventricle has no regional  wall motion abnormalities. Left ventricular diastolic  parameters were  normal.   2. Right ventricular systolic function is normal. The right ventricular  size is normal. There is normal pulmonary artery systolic pressure.   3. The mitral valve is abnormal. Mild mitral valve regurgitation. No  evidence of mitral stenosis.   4. The aortic valve is tricuspid. There is mild calcification of the  aortic valve. There is mild thickening of the aortic valve. Aortic valve  regurgitation is mild to moderate. Aortic valve sclerosis is present, with  no evidence of aortic valve  stenosis.   5. The inferior vena cava is normal in size with greater than 50%  respiratory variability, suggesting right atrial pressure of 3 mmHg.   Carotid US  Summary 01/2023 Right Carotid: There is no evidence of stenosis in the right ICA. The extracranial vessels were near-normal with only minimal wall thickening or plaque. Duplex imaging of the carotid  arteries reveals smooth vessel walls, without plaque formation. Mild bead-like appearance with elevated end diastolic velocities is suggestive of fibromuscular dysplasia.   Left Carotid: There is no evidence of stenosis in the left ICA. The  extracranial vessels were near-normal with only minimal wall thickening  or  plaque. Duplex imaging of the carotid arteries reveals  smooth vessel walls, without plaque formation. Mild bead-like  appearance with elevated end diastolic velocities is suggestive of fibromuscular dysplasia.   Vertebrals:  Bilateral vertebral arteries demonstrate antegrade flow.  Subclavians: Normal flow hemodynamics were seen in bilateral subclavian               arteries.   *See table(s) above for measurements and observations.  Risk Assessment/Calculations:   {Does this patient have ATRIAL FIBRILLATION?:[id number]} No BP recorded.  {Refresh Note OR Click here to enter BP  :1}***       Physical Exam:   VS:  LMP 08/02/2018 (Exact Date)    Wt Readings from Last 3 Encounters:  01/09/23 161 lb (73 kg)   01/01/23 164 lb (74.4 kg)  09/30/22 165 lb (74.8 kg)    GEN: Well nourished, well developed in no acute distress while sitting in chair.  NECK: No JVD; No carotid bruits CARDIAC: ***RRR, no murmurs, rubs, gallops RESPIRATORY:  Clear to auscultation without rales, wheezing or rhonchi  ABDOMEN: Soft, non-tender, non-distended EXTREMITIES:  No edema; No deformity   ASSESSMENT AND PLAN: .   ***    {Are you ordering a CV Procedure (e.g. stress test, cath, DCCV, TEE, etc)?   Press F2        :[id number]}  Dispo: ***  Signed, Lorette CINDERELLA Kapur, PA-C

## 2024-04-28 ENCOUNTER — Ambulatory Visit: Attending: Student | Admitting: Physician Assistant

## 2024-04-28 DIAGNOSIS — I351 Nonrheumatic aortic (valve) insufficiency: Secondary | ICD-10-CM

## 2024-04-28 DIAGNOSIS — I38 Endocarditis, valve unspecified: Secondary | ICD-10-CM

## 2024-04-28 DIAGNOSIS — E785 Hyperlipidemia, unspecified: Secondary | ICD-10-CM

## 2024-04-28 DIAGNOSIS — I10 Essential (primary) hypertension: Secondary | ICD-10-CM

## 2024-04-28 DIAGNOSIS — I779 Disorder of arteries and arterioles, unspecified: Secondary | ICD-10-CM

## 2024-05-05 ENCOUNTER — Ambulatory Visit: Admitting: Nurse Practitioner

## 2024-07-16 ENCOUNTER — Other Ambulatory Visit: Payer: Self-pay | Admitting: Internal Medicine

## 2024-08-20 ENCOUNTER — Telehealth: Payer: Self-pay | Admitting: Internal Medicine

## 2024-08-20 NOTE — Telephone Encounter (Signed)
" °*  STAT* If patient is at the pharmacy, call can be transferred to refill team.   1. Which medications need to be refilled? (please list name of each medication and dose if known)   carvedilol  (COREG ) 3.125 MG tablet   2. Would you like to learn more about the convenience, safety, & potential cost savings by using the [facility]?   3. Are you open to using the [facility] (Type [facility]. ).   4. Which pharmacy/location (including street and city if local pharmacy) is medication to be sent to?  [id number] - , [place] - [address] AT [facility] OF [address]   5. Do they need a 30 day or 90 day supply?   90 day  Patient stated she only has 3 days left of this medication.  Patient has appointment scheduled with Dr. Mona on 1/12. "

## 2024-08-21 ENCOUNTER — Other Ambulatory Visit: Payer: Self-pay

## 2024-08-21 ENCOUNTER — Emergency Department (HOSPITAL_COMMUNITY)
Admission: EM | Admit: 2024-08-21 | Discharge: 2024-08-22 | Attending: Emergency Medicine | Admitting: Emergency Medicine

## 2024-08-21 DIAGNOSIS — R1012 Left upper quadrant pain: Secondary | ICD-10-CM | POA: Diagnosis present

## 2024-08-21 DIAGNOSIS — Z5321 Procedure and treatment not carried out due to patient leaving prior to being seen by health care provider: Secondary | ICD-10-CM | POA: Insufficient documentation

## 2024-08-21 LAB — URINALYSIS, ROUTINE W REFLEX MICROSCOPIC
Bilirubin Urine: NEGATIVE
Glucose, UA: NEGATIVE mg/dL
Ketones, ur: NEGATIVE mg/dL
Nitrite: NEGATIVE
Protein, ur: NEGATIVE mg/dL
Specific Gravity, Urine: <1.005 — ABNORMAL LOW (ref 1.005–1.030)
pH: 6 (ref 5.0–8.0)

## 2024-08-21 LAB — COMPREHENSIVE METABOLIC PANEL WITH GFR
ALT: 17 U/L (ref 0–44)
AST: 29 U/L (ref 15–41)
Albumin: 4.4 g/dL (ref 3.5–5.0)
Alkaline Phosphatase: 123 U/L (ref 38–126)
Anion gap: 10 (ref 5–15)
BUN: 16 mg/dL (ref 6–20)
CO2: 27 mmol/L (ref 22–32)
Calcium: 9.9 mg/dL (ref 8.9–10.3)
Chloride: 102 mmol/L (ref 98–111)
Creatinine, Ser: 0.77 mg/dL (ref 0.44–1.00)
GFR, Estimated: >60 mL/min
Glucose, Bld: 110 mg/dL — ABNORMAL HIGH (ref 70–99)
Potassium: 4.4 mmol/L (ref 3.5–5.1)
Sodium: 139 mmol/L (ref 135–145)
Total Bilirubin: 0.6 mg/dL (ref 0.0–1.2)
Total Protein: 8.1 g/dL (ref 6.5–8.1)

## 2024-08-21 LAB — CBC WITH DIFFERENTIAL/PLATELET
Abs Immature Granulocytes: 0.01 K/uL (ref 0.00–0.07)
Basophils Absolute: 0 K/uL (ref 0.0–0.1)
Basophils Relative: 0 %
Eosinophils Absolute: 0 K/uL (ref 0.0–0.5)
Eosinophils Relative: 1 %
HCT: 42 % (ref 36.0–46.0)
Hemoglobin: 14 g/dL (ref 12.0–15.0)
Immature Granulocytes: 0 %
Lymphocytes Relative: 53 %
Lymphs Abs: 3.3 K/uL (ref 0.7–4.0)
MCH: 30.2 pg (ref 26.0–34.0)
MCHC: 33.3 g/dL (ref 30.0–36.0)
MCV: 90.7 fL (ref 80.0–100.0)
Monocytes Absolute: 0.4 K/uL (ref 0.1–1.0)
Monocytes Relative: 7 %
Neutro Abs: 2.5 K/uL (ref 1.7–7.7)
Neutrophils Relative %: 39 %
Platelets: 292 K/uL (ref 150–400)
RBC: 4.63 MIL/uL (ref 3.87–5.11)
RDW: 13.2 % (ref 11.5–15.5)
WBC: 6.3 K/uL (ref 4.0–10.5)
nRBC: 0 % (ref 0.0–0.2)

## 2024-08-21 LAB — URINALYSIS, MICROSCOPIC (REFLEX)

## 2024-08-21 LAB — LIPASE, BLOOD: Lipase: 48 U/L (ref 11–51)

## 2024-08-21 NOTE — ED Triage Notes (Signed)
 Pt complaining of pain in the left upper abdomen that started around 10 this evening. Does not radiate.

## 2024-08-22 NOTE — ED Notes (Signed)
Pt left after being roomed.

## 2024-08-23 ENCOUNTER — Other Ambulatory Visit: Payer: Self-pay | Admitting: Internal Medicine

## 2024-08-23 MED ORDER — CARVEDILOL 3.125 MG PO TABS: 3.1250 mg | ORAL_TABLET | Freq: Two times a day (BID) | ORAL | 0 refills | Status: DC

## 2024-08-23 NOTE — Addendum Note (Signed)
 Addended by: DARIO IZETTA CROME on: 08/23/2024 03:19 PM   Modules accepted: Orders

## 2024-08-23 NOTE — Telephone Encounter (Signed)
"   Disp Refills Start End   carvedilol  (COREG ) 3.125 MG tablet 30 tablet 0 08/20/2024 --   Sig - Route: TAKE 1 TABLET BY MOUTH TWICE DAILY WITH A MEAL - Oral   Sent to pharmacy as: carvedilol  (COREG ) 3.125 MG tablet   Notes to Pharmacy: Please call our office to schedule an overdue appointment with Dr. Mona before anymore refills. [phone]. Thank you 3rd and Final Attempt   E-Prescribing Status: Receipt confirmed by pharmacy (08/20/2024  2:33 PM EST)    "

## 2024-09-13 ENCOUNTER — Ambulatory Visit: Admitting: Internal Medicine

## 2024-09-21 ENCOUNTER — Other Ambulatory Visit: Payer: Self-pay | Admitting: Internal Medicine

## 2024-09-27 ENCOUNTER — Ambulatory Visit: Admitting: Physician Assistant
